# Patient Record
Sex: Female | Born: 1945
Health system: Southern US, Community
[De-identification: ages and names within clinical notes are randomized; demographics above are authoritative.]

## PROBLEM LIST (undated history)

## (undated) DIAGNOSIS — R609 Edema, unspecified: Secondary | ICD-10-CM

## (undated) DIAGNOSIS — M1990 Unspecified osteoarthritis, unspecified site: Secondary | ICD-10-CM

## (undated) DIAGNOSIS — R600 Localized edema: Secondary | ICD-10-CM

## (undated) DIAGNOSIS — G5600 Carpal tunnel syndrome, unspecified upper limb: Secondary | ICD-10-CM

## (undated) DIAGNOSIS — T7840XA Allergy, unspecified, initial encounter: Secondary | ICD-10-CM

## (undated) DIAGNOSIS — F419 Anxiety disorder, unspecified: Secondary | ICD-10-CM

## (undated) DIAGNOSIS — K219 Gastro-esophageal reflux disease without esophagitis: Secondary | ICD-10-CM

## (undated) DIAGNOSIS — F32A Depression, unspecified: Secondary | ICD-10-CM

## (undated) DIAGNOSIS — M65312 Trigger thumb, left thumb: Secondary | ICD-10-CM

## (undated) DIAGNOSIS — T782XXA Anaphylactic shock, unspecified, initial encounter: Secondary | ICD-10-CM

## (undated) DIAGNOSIS — I10 Essential (primary) hypertension: Secondary | ICD-10-CM

## (undated) DIAGNOSIS — F329 Major depressive disorder, single episode, unspecified: Secondary | ICD-10-CM

## (undated) HISTORY — DX: Essential (primary) hypertension: I10

## (undated) HISTORY — PX: CARPAL TUNNEL RELEASE: SHX101

## (undated) HISTORY — DX: Anaphylactic shock, unspecified, initial encounter: T78.2XXA

## (undated) HISTORY — PX: ROTATOR CUFF REPAIR: SHX139

## (undated) HISTORY — DX: Carpal tunnel syndrome, unspecified upper limb: G56.00

## (undated) HISTORY — DX: Trigger thumb, left thumb: M65.312

## (undated) HISTORY — DX: Localized edema: R60.0

## (undated) HISTORY — DX: Edema, unspecified: R60.9

## (undated) HISTORY — DX: Allergy, unspecified, initial encounter: T78.40XA

---

## 2006-09-07 ENCOUNTER — Emergency Department: Payer: Self-pay

## 2009-01-15 ENCOUNTER — Encounter: Payer: Self-pay | Admitting: Gastroenterology

## 2009-01-22 ENCOUNTER — Encounter: Payer: Self-pay | Admitting: Gastroenterology

## 2009-01-22 ENCOUNTER — Ambulatory Visit: Payer: Self-pay | Admitting: General Surgery

## 2009-01-24 ENCOUNTER — Encounter: Payer: Self-pay | Admitting: Gastroenterology

## 2009-02-12 ENCOUNTER — Encounter: Payer: Self-pay | Admitting: Gastroenterology

## 2009-02-16 ENCOUNTER — Ambulatory Visit: Payer: Self-pay | Admitting: General Surgery

## 2009-02-16 ENCOUNTER — Encounter: Payer: Self-pay | Admitting: Gastroenterology

## 2009-03-03 ENCOUNTER — Encounter: Payer: Self-pay | Admitting: Gastroenterology

## 2009-03-03 DIAGNOSIS — R933 Abnormal findings on diagnostic imaging of other parts of digestive tract: Secondary | ICD-10-CM

## 2009-03-18 ENCOUNTER — Encounter: Payer: Self-pay | Admitting: Gastroenterology

## 2009-03-18 ENCOUNTER — Ambulatory Visit (HOSPITAL_COMMUNITY): Admission: RE | Admit: 2009-03-18 | Discharge: 2009-03-18 | Payer: Self-pay | Admitting: Gastroenterology

## 2009-03-18 ENCOUNTER — Ambulatory Visit: Payer: Self-pay | Admitting: Gastroenterology

## 2009-03-19 ENCOUNTER — Telehealth: Payer: Self-pay | Admitting: Gastroenterology

## 2009-03-23 ENCOUNTER — Encounter (INDEPENDENT_AMBULATORY_CARE_PROVIDER_SITE_OTHER): Payer: Self-pay | Admitting: *Deleted

## 2009-03-29 ENCOUNTER — Telehealth: Payer: Self-pay | Admitting: Gastroenterology

## 2009-04-21 HISTORY — PX: HERNIA REPAIR: SHX51

## 2009-05-03 ENCOUNTER — Ambulatory Visit: Payer: Self-pay | Admitting: General Surgery

## 2009-05-10 ENCOUNTER — Ambulatory Visit: Payer: Self-pay | Admitting: General Surgery

## 2012-01-08 LAB — HM PAP SMEAR

## 2013-06-06 ENCOUNTER — Ambulatory Visit: Payer: Self-pay | Admitting: Orthopedic Surgery

## 2013-07-10 ENCOUNTER — Ambulatory Visit: Payer: Self-pay | Admitting: Orthopedic Surgery

## 2013-07-10 LAB — URINALYSIS, COMPLETE
Bacteria: NONE SEEN
Bilirubin,UR: NEGATIVE
Glucose,UR: NEGATIVE mg/dL (ref 0–75)
Nitrite: NEGATIVE
Ph: 5 (ref 4.5–8.0)
Protein: NEGATIVE
Specific Gravity: 1.024 (ref 1.003–1.030)
WBC UR: 14 /HPF (ref 0–5)

## 2013-07-15 ENCOUNTER — Ambulatory Visit: Payer: Self-pay | Admitting: Orthopedic Surgery

## 2013-07-19 ENCOUNTER — Emergency Department: Payer: Self-pay | Admitting: Emergency Medicine

## 2013-07-19 LAB — URINALYSIS, COMPLETE
Bacteria: NONE SEEN
Bilirubin,UR: NEGATIVE
Glucose,UR: NEGATIVE mg/dL (ref 0–75)
Nitrite: NEGATIVE
Ph: 6 (ref 4.5–8.0)
Protein: 100
RBC,UR: 237 /HPF (ref 0–5)
WBC UR: 1678 /HPF (ref 0–5)

## 2013-07-21 LAB — URINE CULTURE

## 2013-12-03 DIAGNOSIS — M19019 Primary osteoarthritis, unspecified shoulder: Secondary | ICD-10-CM | POA: Insufficient documentation

## 2013-12-31 DIAGNOSIS — K219 Gastro-esophageal reflux disease without esophagitis: Secondary | ICD-10-CM | POA: Insufficient documentation

## 2014-07-21 DIAGNOSIS — M7540 Impingement syndrome of unspecified shoulder: Secondary | ICD-10-CM | POA: Insufficient documentation

## 2014-07-21 DIAGNOSIS — Z471 Aftercare following joint replacement surgery: Secondary | ICD-10-CM | POA: Insufficient documentation

## 2014-07-21 DIAGNOSIS — Z96612 Presence of left artificial shoulder joint: Secondary | ICD-10-CM

## 2014-12-11 NOTE — Op Note (Signed)
PATIENT NAME:  Emily Watson, ADORNO MR#:  [id number] DATE OF BIRTH:  12-17-1945  DATE OF PROCEDURE:  07/15/2013  PREOPERATIVE DIAGNOSES: Chronic left shoulder impingement, acromioclavicular joint arthritis, glenohumeral joint arthritis, partial rotator cuff tear and biceps tendinosis.   POSTOPERATIVE DIAGNOSES: Left shoulder chronic impingement, acromioclavicular and glenohumeral joint arthritis, partial rotator cuff tear, biceps tendinosis, glenohumeral synovitis, severe subacromial bursitis and superior labral tear.   PROCEDURE: Left shoulder arthroscopic chondroplasty of the humeral head, debridement of the superior labrum, partial synovectomy of the glenohumeral joint, subacromial decompression, distal clavicle excision, arthroscopic biceps tenotomy and mini-open rotator cuff repair.   ANESTHESIA: General with left interscalene block.   SURGEON: Thornton Park, M.D.   ASSISTANT: Leone Haven, an [employer] student.   ESTIMATED BLOOD LOSS: Minimal.   COMPLICATIONS: None.   IMPLANTS: ArthroCare Magnum 2 anchors x 2, ArthroCare Magnum M anchor x1.   INDICATIONS FOR PROCEDURE: The patient is a 69 year old female who injured her right shoulder at work. 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 The patient developed these symptoms while lifting a cage at work. This occurred in June of 2014. She was initially evaluated and treated at [facility]. She has failed nonoperative management including multiple corticosteroid injections, physical therapy, as well as nonsteroidal anti-inflammatories. The patient had an MRI demonstrating a severe glenohumeral joint arthrosis as well as demonstrated tenderness of the supraspinatus tendon with a small interstitial tear near its peripheral attachment. The patient also was found to have fluid in the subdeltoid bursa consistent with bursitis. The patient has opted to proceed with surgical intervention given her failure  of nonoperative management.   The risks and benefits of the surgery were discussed with the patient. She understood the risks of performing this operation included, but are not limited to, infection, bleeding, nerve or blood vessel injury, especially injury to the axillary nerve leading to arm numbness or weakness, shoulder stiffness, persistent pain, reinjury and the need for further surgery. Medical complications include, but are not limited to, DVT and pulmonary embolism, myocardial infarction, stroke, pneumonia, respiratory failure and death.    PROCEDURE NOTE: The patient was met in the preoperative area. Her daughter-in-law was at the bedside. I reviewed the postoperative course with the patient. I signed her left shoulder with the word yes according to the hospital's right site protocol, after verbally confirming with the patient that this was the correct site of surgery. I also confirmed the correct site of surgery with my office notes and the radiographic studies. I updated the patient's history and physical. The patient was then brought to the operating room. She underwent a left interscalene block by the anesthesia department. She underwent general endotracheal intubation. She was then placed in a beach chair position. A spider arm positioner was used for this case. All bony prominences were adequately padded. The patient was then prepped and draped in a sterile fashion.   A timeout was performed to verify the patient's name, date of birth, medical record number, correct site of surgery and correct procedure to be performed. It was also used to verify the patient had received antibiotics and that all appropriate instruments, implants and radiographic studies were available in the room. Once all in attendance were in agreement, the case began.   Examination under anesthesia demonstrated full range of motion without instability to load-and-shift testing in both the anterior and posterior directions.  She had a negative sulcus sign.   Bony landmarks were  drawn out with a surgical marker along with proposed incisions. The posterior incision was then made with an 11 blade. A hemostat was used to spread the posterior muscle fibers, and the arthroscope was placed into the glenohumeral joint from the posterior portal. An anterior portal was then established using an 18-gauge spinal needle for localization. The anterior portal was also created with 11 blade. A 5.75 mm cannula was placed through the anterior portal. A full diagnostic examination of the glenohumeral joint was performed.   Findings on arthroscopy included a superior labral tear, anterior and posterior (SLAP tear). The patient had no anterior labral tear, but there was fraying of the posterior labrum. The patient had an intact subscapularis. The biceps tendon was significantly thickened and degenerative. The patient had fraying of the supraspinatus at the greater tuberosity. There was extensive glenohumeral joint arthrosis with chondral loss. There was extensive glenohumeral arthritis on both the glenoid surface as well as the humeral head. There was no Hagl lesion nor loose bodies in the inferior recess.   The superior labral tear was debrided with a 4.0 resector shaver blade placed through the anterior portal. A curved resector shaver blade was then used to perform a chondroplasty of the humeral head removing any loose flaps of cartilage. Given the extensive degeneration within the biceps tendon, a biceps tenotomy was performed using an arthroscopic scissors to release the biceps tendon from its attachment to the superior labrum. The biceps stump was debrided with a 4.0 resector shaver blade from the superior labrum. An 0 PDS was then placed as a marker through the supraspinatus to allow for identification of the supraspinatus from the bursal side.   The arthroscope was then removed from the glenohumeral joint. It was then placed into the  subacromial space. Immediately, extensive bursitis was seen with the arthroscope, making visualization very difficult. A lateral portal was then established, again using an 18-gauge spinal needle for localization. A 4.0 resector shaver blade was placed through the lateral portal, and an extensive bursectomy was performed. A 90-degree ArthroCare wand was used to cauterize any bleeding vessels and to assist in the bursectomy. The ArthroCare wand was also used to identify the margins of the acromion and the [facility] joint. A 5.5 mm resector shaver blade was then placed through the lateral portal, and a subacromial decompression was performed. The 5.5 mm resector shaver blade was then placed through the anterior portal into the [facility] joint where a distal clavicle excision was performed. Final images of the distal clavicle excision, subacromial decompression and bursectomy were taken. Once the bursectomy was completed, the 0 PDS was visualized. The arthroscope was then placed through the lateral portal. A hook probe was placed through the posterior portal, and the supraspinatus was carefully probed. There was a defect in the posterior aspect of the supraspinatus from the bursal side. The decision was, therefore, made to take down the supraspinatus for repair. The 0 PDS was removed. A 4.0 resector shaver blade and 90-degree ArthroCare wand were then used to remove the supraspinatus from its greater tuberosity attachment. Once the supraspinatus was removed from the greater tuberosity, a single Smart stitch was placed through the lateral portal for later identification during the mini-open rotator cuff repair.   All arthroscopic instruments were removed. A saber-type incision was made along the lateral border of the acromion. The deltoid fascia was then identified. The deltoid was split in line with its fibers. The 2 suture tails from the Smart stitch were then brought out through  the deltoid split. A self-retaining shoulder  retractor was placed through the deltoid split allowing visualization of the lateral border of the supraspinatus which had been tagged with a Smart stitch. Two additional Smart stitches were placed in the lateral border of the rotator cuff. The initial tagging Smart stitch was removed. The greater tuberosity was then carefully burred to remove all remaining fibers of the rotator cuff and to allow visualization of punctate bleeding which would assist in the healing of the rotator cuff. A single drill hole was then placed at the articular margin of the humeral head allowing for placement of a single ArthroCare Magnum M anchor. The 4 suture tails from this suture device were then passed with a FirstPass suture passer through the medial portion of the rotator cuff and clamped for later repair. Then, 2 drill holes were made along the lateral proximal humerus, and 2 Magnum 2 anchors were placed in these holes after being preloaded with the Smart stitch sutures. The sutures were then tensioned to allow for excellent approximation of the lateral rotator cuff to the greater tuberosity. The Magnum 2 anchors were then deployed. The remaining suture tails were then cut using an arthroscopic suture cutter. The 2 Magnum M sutures were then hand tied down for the medial row fixation and again cut with an arthroscopic suture cutter. The patient had excellent approximation of the supraspinatus to the greater tuberosity footprint. There were no dog ears. The rotator cuff moved as a unit with rotation of the proximal humerus. Pictures of the repair were taken both externally and from the glenohumeral joint using the arthroscope. The wounds were then copiously irrigated. All arthroscopic equipment was removed. The deltoid fascia was closed with 0 Vicryl and the subcutaneous tissue of the saber incision was closed with a 2-0 Vicryl. The skin of the saber incision was closed with a running 4-0 Monocryl. The 3 portal incisions were closed  with 4-0 nylon in an interrupted fashion. Steri-Strips and Xeroform were placed over the incisions and 30 mL of 0.25% Marcaine plain injected in the subacromial space and the glenohumeral joint.   Dry sterile dressing was applied along with TENS unit leads and a Polar Care sleeve, as well as an abduction sling. The patient was then awakened and transferred to a hospital stretcher and brought to the PACU in stable condition. I was scrubbed and present for the entire case, and all sharp and instrument counts were correct at the conclusion of the case. I spoke with the patient's daughter-in-law in the postop consultation room to let her know the case had gone without complication and the patient was stable in the recovery room.   ____________________________ Timoteo Gaul, MD [id number] D: 07/16/2013 22:51:39 ET T: 07/16/2013 23:23:22 ET JOB#: [id number]  cc: Timoteo Gaul, MD, <Dictator> Timoteo Gaul MD ELECTRONICALLY SIGNED 07/30/2013 17:58

## 2015-03-14 ENCOUNTER — Other Ambulatory Visit: Payer: Self-pay | Admitting: Unknown Physician Specialty

## 2015-04-21 ENCOUNTER — Other Ambulatory Visit: Payer: Self-pay | Admitting: Unknown Physician Specialty

## 2015-04-21 NOTE — Telephone Encounter (Signed)
Needs seen further refills 

## 2015-05-18 ENCOUNTER — Other Ambulatory Visit: Payer: Self-pay | Admitting: Unknown Physician Specialty

## 2015-05-26 DIAGNOSIS — H04211 Epiphora due to excess lacrimation, right lacrimal gland: Secondary | ICD-10-CM | POA: Diagnosis not present

## 2015-06-03 ENCOUNTER — Ambulatory Visit: Payer: Self-pay

## 2015-06-04 ENCOUNTER — Ambulatory Visit: Payer: Medicare Other

## 2015-06-04 DIAGNOSIS — Z23 Encounter for immunization: Secondary | ICD-10-CM

## 2015-06-07 DIAGNOSIS — R609 Edema, unspecified: Secondary | ICD-10-CM | POA: Insufficient documentation

## 2015-06-07 DIAGNOSIS — R600 Localized edema: Secondary | ICD-10-CM | POA: Insufficient documentation

## 2015-06-07 DIAGNOSIS — M65312 Trigger thumb, left thumb: Secondary | ICD-10-CM

## 2015-06-07 DIAGNOSIS — J309 Allergic rhinitis, unspecified: Secondary | ICD-10-CM | POA: Insufficient documentation

## 2015-06-07 DIAGNOSIS — F411 Generalized anxiety disorder: Secondary | ICD-10-CM | POA: Insufficient documentation

## 2015-06-07 DIAGNOSIS — G5600 Carpal tunnel syndrome, unspecified upper limb: Secondary | ICD-10-CM | POA: Insufficient documentation

## 2015-06-07 DIAGNOSIS — IMO0002 Reserved for concepts with insufficient information to code with codable children: Secondary | ICD-10-CM

## 2015-06-07 DIAGNOSIS — E7800 Pure hypercholesterolemia, unspecified: Secondary | ICD-10-CM | POA: Insufficient documentation

## 2015-06-07 DIAGNOSIS — F419 Anxiety disorder, unspecified: Secondary | ICD-10-CM

## 2015-06-07 DIAGNOSIS — T782XXA Anaphylactic shock, unspecified, initial encounter: Secondary | ICD-10-CM | POA: Insufficient documentation

## 2015-06-07 DIAGNOSIS — H9319 Tinnitus, unspecified ear: Secondary | ICD-10-CM | POA: Insufficient documentation

## 2015-06-07 DIAGNOSIS — T782XXS Anaphylactic shock, unspecified, sequela: Secondary | ICD-10-CM

## 2015-06-07 DIAGNOSIS — I10 Essential (primary) hypertension: Secondary | ICD-10-CM | POA: Insufficient documentation

## 2015-06-08 ENCOUNTER — Ambulatory Visit: Payer: Self-pay | Admitting: Unknown Physician Specialty

## 2015-06-16 ENCOUNTER — Other Ambulatory Visit: Payer: Self-pay | Admitting: Unknown Physician Specialty

## 2015-07-14 ENCOUNTER — Encounter: Payer: Self-pay | Admitting: Unknown Physician Specialty

## 2015-07-16 ENCOUNTER — Other Ambulatory Visit: Payer: Self-pay | Admitting: Unknown Physician Specialty

## 2015-08-03 ENCOUNTER — Encounter: Payer: Self-pay | Admitting: Unknown Physician Specialty

## 2015-08-03 ENCOUNTER — Ambulatory Visit (INDEPENDENT_AMBULATORY_CARE_PROVIDER_SITE_OTHER): Payer: Medicare Other | Admitting: Unknown Physician Specialty

## 2015-08-03 VITALS — BP 140/80 | HR 92 | Temp 98.4°F | Ht 61.3 in | Wt 207.6 lb

## 2015-08-03 DIAGNOSIS — Z23 Encounter for immunization: Secondary | ICD-10-CM | POA: Diagnosis not present

## 2015-08-03 DIAGNOSIS — L509 Urticaria, unspecified: Secondary | ICD-10-CM

## 2015-08-03 DIAGNOSIS — L739 Follicular disorder, unspecified: Secondary | ICD-10-CM

## 2015-08-03 DIAGNOSIS — F419 Anxiety disorder, unspecified: Secondary | ICD-10-CM

## 2015-08-03 MED ORDER — TRIAMCINOLONE ACETONIDE 0.1 % EX CREA: 1.0000 "application " | TOPICAL_CREAM | Freq: Two times a day (BID) | CUTANEOUS | Status: DC

## 2015-08-03 MED ORDER — DOXYCYCLINE HYCLATE 100 MG PO TABS: 100.0000 mg | ORAL_TABLET | Freq: Two times a day (BID) | ORAL | Status: DC

## 2015-08-03 MED ORDER — DULOXETINE HCL 60 MG PO CPEP: 60.0000 mg | ORAL_CAPSULE | Freq: Every day | ORAL | Status: DC

## 2015-08-03 NOTE — Assessment & Plan Note (Signed)
This seems to be related to a family issue.  Encouraged exercise and pastoral counseling

## 2015-08-03 NOTE — Progress Notes (Signed)
BP 140/80 mmHg  Pulse 92  Temp(Src) 98.4 F (36.9 C)  Ht 5' 1.3" (1.557 m)  Wt 207 lb 9.6 oz (94.167 kg)  BMI 38.84 kg/m2  SpO2 94%  LMP 02/01/1991 (Approximate)   Subjective:    Patient ID: Emily Watson, female    DOB: 1945/08/30, 69 y.o.   MRN: [id number]  HPI: Emily Watson is a 69 y.o. female  Chief Complaint  Patient presents with  . Anxiety    pt states she gets very impatient and starts itching when she gets nervous  . Shoulder Pain    pt states she has been having some left shoulder pain   Depression Her depression is doing well but gets anxious easily.  It seems to be related to family changes that are going on for a while.    Depression screen [facility] 2/9 08/03/2015  Decreased Interest 0  Down, Depressed, Hopeless 0  PHQ - 2 Score 0   Rash Pt with breaking out in hives base of neck and has radiated to upper back.  She is very itchy.  This has been going on for several weeks.    Relevant past medical, surgical, family and social history reviewed and updated as indicated. Interim medical history since our last visit reviewed. Allergies and medications reviewed and updated.  Review of Systems  Constitutional: Negative.   HENT: Negative.   Eyes: Negative.   Respiratory: Negative.   Cardiovascular: Negative.   Gastrointestinal: Negative.   Endocrine: Negative.   Genitourinary: Negative.   Musculoskeletal:       Bruised left elbow with falling.  She is protective of left shoulder  Skin: Negative.   Allergic/Immunologic: Negative.   Neurological: Negative.   Hematological: Negative.   Psychiatric/Behavioral: Negative.     Per HPI unless specifically indicated above     Objective:    BP 140/80 mmHg  Pulse 92  Temp(Src) 98.4 F (36.9 C)  Ht 5' 1.3" (1.557 m)  Wt 207 lb 9.6 oz (94.167 kg)  BMI 38.84 kg/m2  SpO2 94%  LMP 02/01/1991 (Approximate)  Wt Readings from Last 3 Encounters:  08/03/15 207 lb 9.6 oz (94.167 kg)  08/19/14 205 lb (92.987 kg)     Physical Exam  Constitutional: She is oriented to person, place, and time. She appears well-developed and well-nourished. No distress.  HENT:  Head: Normocephalic and atraumatic.  Eyes: Conjunctivae and lids are normal. Right eye exhibits no discharge. Left eye exhibits no discharge. No scleral icterus.  Cardiovascular: Normal rate, regular rhythm and normal heart sounds.   Pulmonary/Chest: Effort normal and breath sounds normal.  Abdominal: Normal appearance. There is no splenomegaly or hepatomegaly.  Musculoskeletal: Normal range of motion.  Neurological: She is alert and oriented to person, place, and time.  Skin: Skin is warm, dry and intact. No rash noted. No pallor.  Macules back of neck and upper back.    Psychiatric: She has a normal mood and affect. Her behavior is normal. Judgment and thought content normal.    Results for orders placed or performed in visit on 06/07/15  HM PAP SMEAR  Result Value Ref Range   HM Pap smear from PP       Assessment & Plan:   Problem List Items Addressed This Visit      Unprioritized   Acute anxiety    This seems to be related to a family issue.  Encouraged exercise and pastoral counseling      Relevant Medications   DULoxetine (CYMBALTA) 60  MG capsule    Other Visit Diagnoses    Immunization due    -  Primary    Relevant Orders    Pneumococcal polysaccharide vaccine 23-valent greater than or equal to 2yo subcutaneous/IM (Completed)    Urticaria        Steroid cream    Folliculitis        doxycycline as I suspect infectious source       BP is much better on second check  Follow up plan: Return in about 6 weeks (around 09/14/2015).

## 2015-09-03 ENCOUNTER — Other Ambulatory Visit: Payer: Self-pay | Admitting: Unknown Physician Specialty

## 2015-09-14 ENCOUNTER — Encounter: Payer: Self-pay | Admitting: Unknown Physician Specialty

## 2015-09-14 ENCOUNTER — Ambulatory Visit (INDEPENDENT_AMBULATORY_CARE_PROVIDER_SITE_OTHER): Payer: Medicare Other | Admitting: Unknown Physician Specialty

## 2015-09-14 VITALS — BP 153/79 | HR 83 | Temp 98.3°F | Ht 60.3 in | Wt 207.2 lb

## 2015-09-14 DIAGNOSIS — H9319 Tinnitus, unspecified ear: Secondary | ICD-10-CM

## 2015-09-14 DIAGNOSIS — I10 Essential (primary) hypertension: Secondary | ICD-10-CM | POA: Diagnosis not present

## 2015-09-14 DIAGNOSIS — R1032 Left lower quadrant pain: Secondary | ICD-10-CM | POA: Diagnosis not present

## 2015-09-14 NOTE — Assessment & Plan Note (Signed)
discussed

## 2015-09-14 NOTE — Progress Notes (Signed)
BP 153/79 mmHg  Pulse 83  Temp(Src) 98.3 F (36.8 C)  Ht 5' 0.3" (1.532 m)  Wt 207 lb 3.2 oz (93.985 kg)  BMI 40.04 kg/m2  SpO2 94%  LMP 02/01/1991 (Approximate)   Subjective:    Patient ID: Emily Watson, female    DOB: 07/18/1946, 70 y.o.   MRN: [id number]  HPI: Emily Watson is a 70 y.o. female  Chief Complaint  Patient presents with  . Hypertension  . Hearing Problem    pt states she has been having trouble hearing here recently  . Pain    pt states she has been having some pain in groin area on left side and in her left knee   Hypertension: F/u BP from last visit.  She is not checking it at home.  No SOB, chest pain, or swelling.    Relevant past medical, surgical, family and social history reviewed and updated as indicated. Interim medical history since our last visit reviewed. Allergies and medications reviewed and updated.  Review of Systems  Constitutional: Negative.   HENT: Positive for tinnitus.        Tinnius left ear  Eyes: Negative.   Respiratory: Negative.   Cardiovascular: Negative.   Gastrointestinal: Negative.   Endocrine: Negative.   Musculoskeletal:       Left groin pain for 2 weeks and maybe longer.  Comes and goes.      Per HPI unless specifically indicated above     Objective:    BP 153/79 mmHg  Pulse 83  Temp(Src) 98.3 F (36.8 C)  Ht 5' 0.3" (1.532 m)  Wt 207 lb 3.2 oz (93.985 kg)  BMI 40.04 kg/m2  SpO2 94%  LMP 02/01/1991 (Approximate)  Wt Readings from Last 3 Encounters:  09/14/15 207 lb 3.2 oz (93.985 kg)  08/03/15 207 lb 9.6 oz (94.167 kg)  08/19/14 205 lb (92.987 kg)    Physical Exam  Constitutional: She is oriented to person, place, and time. She appears well-developed and well-nourished. No distress.  HENT:  Head: Normocephalic and atraumatic.  Eyes: Conjunctivae and lids are normal. Right eye exhibits no discharge. Left eye exhibits no discharge. No scleral icterus.  Neck: Normal range of motion. Neck supple. No  JVD present. Carotid bruit is not present.  Cardiovascular: Normal rate, regular rhythm and normal heart sounds.   Pulmonary/Chest: Effort normal and breath sounds normal.  Abdominal: Normal appearance. There is no splenomegaly or hepatomegaly.  Musculoskeletal: Normal range of motion.  Neurological: She is alert and oriented to person, place, and time.  Skin: Skin is warm, dry and intact. No rash noted. No pallor.  Psychiatric: She has a normal mood and affect. Her behavior is normal. Judgment and thought content normal.    Results for orders placed or performed in visit on 06/07/15  HM PAP SMEAR  Result Value Ref Range   HM Pap smear from PP       Assessment & Plan:   Problem List Items Addressed This Visit      Unprioritized   Hypertension - Primary    On no meds.  BP borderline high.  Pt ed on Dash diet and weight loss.        Tinnitus    discussed       Other Visit Diagnoses    Groin pain, left        Tylenol.  Monitor and consider x-ray        Follow up plan: Return in about 3 months (around 12/13/2015).

## 2015-09-14 NOTE — Patient Instructions (Signed)
DASH Eating Plan  DASH stands for "Dietary Approaches to Stop Hypertension." The DASH eating plan is a healthy eating plan that has been shown to reduce high blood pressure (hypertension). Additional health benefits may include reducing the risk of type 2 diabetes mellitus, heart disease, and stroke. The DASH eating plan may also help with weight loss.  WHAT DO I NEED TO KNOW ABOUT THE DASH EATING PLAN?  For the DASH eating plan, you will follow these general guidelines:  · Choose foods with a percent daily value for sodium of less than 5% (as listed on the food label).  · Use salt-free seasonings or herbs instead of table salt or sea salt.  · Check with your health care provider or pharmacist before using salt substitutes.  · Eat lower-sodium products, often labeled as "lower sodium" or "no salt added."  · Eat fresh foods.  · Eat more vegetables, fruits, and low-fat dairy products.  · Choose whole grains. Look for the word "whole" as the first word in the ingredient list.  · Choose fish and skinless chicken or turkey more often than red meat. Limit fish, poultry, and meat to 6 oz (170 g) each day.  · Limit sweets, desserts, sugars, and sugary drinks.  · Choose heart-healthy fats.  · Limit cheese to 1 oz (28 g) per day.  · Eat more home-cooked food and less restaurant, buffet, and fast food.  · Limit fried foods.  · Cook foods using methods other than frying.  · Limit canned vegetables. If you do use them, rinse them well to decrease the sodium.  · When eating at a restaurant, ask that your food be prepared with less salt, or no salt if possible.  WHAT FOODS CAN I EAT?  Seek help from a dietitian for individual calorie needs.  Grains  Whole grain or whole wheat bread. Brown rice. Whole grain or whole wheat pasta. Quinoa, bulgur, and whole grain cereals. Low-sodium cereals. Corn or whole wheat flour tortillas. Whole grain cornbread. Whole grain crackers. Low-sodium crackers.  Vegetables  Fresh or frozen vegetables  (raw, steamed, roasted, or grilled). Low-sodium or reduced-sodium tomato and vegetable juices. Low-sodium or reduced-sodium tomato sauce and paste. Low-sodium or reduced-sodium canned vegetables.   Fruits  All fresh, canned (in natural juice), or frozen fruits.  Meat and Other Protein Products  Ground beef (85% or leaner), grass-fed beef, or beef trimmed of fat. Skinless chicken or turkey. Ground chicken or turkey. Pork trimmed of fat. All fish and seafood. Eggs. Dried beans, peas, or lentils. Unsalted nuts and seeds. Unsalted canned beans.  Dairy  Low-fat dairy products, such as skim or 1% milk, 2% or reduced-fat cheeses, low-fat ricotta or cottage cheese, or plain low-fat yogurt. Low-sodium or reduced-sodium cheeses.  Fats and Oils  Tub margarines without trans fats. Light or reduced-fat mayonnaise and salad dressings (reduced sodium). Avocado. Safflower, olive, or canola oils. Natural peanut or almond butter.  Other  Unsalted popcorn and pretzels.  The items listed above may not be a complete list of recommended foods or beverages. Contact your dietitian for more options.  WHAT FOODS ARE NOT RECOMMENDED?  Grains  White bread. White pasta. White rice. Refined cornbread. Bagels and croissants. Crackers that contain trans fat.  Vegetables  Creamed or fried vegetables. Vegetables in a cheese sauce. Regular canned vegetables. Regular canned tomato sauce and paste. Regular tomato and vegetable juices.  Fruits  Dried fruits. Canned fruit in light or heavy syrup. Fruit juice.  Meat and Other Protein   Products  Fatty cuts of meat. Ribs, chicken wings, bacon, sausage, bologna, salami, chitterlings, fatback, hot dogs, bratwurst, and packaged luncheon meats. Salted nuts and seeds. Canned beans with salt.  Dairy  Whole or 2% milk, cream, half-and-half, and cream cheese. Whole-fat or sweetened yogurt. Full-fat cheeses or blue cheese. Nondairy creamers and whipped toppings. Processed cheese, cheese spreads, or cheese  curds.  Condiments  Onion and garlic salt, seasoned salt, table salt, and sea salt. Canned and packaged gravies. Worcestershire sauce. Tartar sauce. Barbecue sauce. Teriyaki sauce. Soy sauce, including reduced sodium. Steak sauce. Fish sauce. Oyster sauce. Cocktail sauce. Horseradish. Ketchup and mustard. Meat flavorings and tenderizers. Bouillon cubes. Hot sauce. Tabasco sauce. Marinades. Taco seasonings. Relishes.  Fats and Oils  Butter, stick margarine, lard, shortening, ghee, and bacon fat. Coconut, palm kernel, or palm oils. Regular salad dressings.  Other  Pickles and olives. Salted popcorn and pretzels.  The items listed above may not be a complete list of foods and beverages to avoid. Contact your dietitian for more information.  WHERE CAN I FIND MORE INFORMATION?  National Heart, Lung, and Blood Institute: www.nhlbi.nih.gov/health/health-topics/topics/dash/     This information is not intended to replace advice given to you by your health care provider. Make sure you discuss any questions you have with your health care provider.     Document Released: 07/27/2011 Document Revised: 08/28/2014 Document Reviewed: 06/11/2013  Elsevier Interactive Patient Education ©2016 [employer]

## 2015-09-14 NOTE — Assessment & Plan Note (Signed)
On no meds.  BP borderline high.  Pt ed on Dash diet and weight loss.

## 2015-10-01 ENCOUNTER — Other Ambulatory Visit: Payer: Self-pay | Admitting: Unknown Physician Specialty

## 2015-10-05 ENCOUNTER — Encounter: Payer: Self-pay | Admitting: Unknown Physician Specialty

## 2015-10-05 ENCOUNTER — Ambulatory Visit (INDEPENDENT_AMBULATORY_CARE_PROVIDER_SITE_OTHER): Payer: Medicare Other | Admitting: Unknown Physician Specialty

## 2015-10-05 VITALS — BP 122/73 | HR 86 | Temp 98.8°F | Ht 60.5 in | Wt 201.4 lb

## 2015-10-05 DIAGNOSIS — J069 Acute upper respiratory infection, unspecified: Secondary | ICD-10-CM

## 2015-10-05 NOTE — Progress Notes (Signed)
   BP 122/73 mmHg  Pulse 86  Temp(Src) 98.8 F (37.1 C)  Ht 5' 0.5" (1.537 m)  Wt 201 lb 6.4 oz (91.354 kg)  BMI 38.67 kg/m2  SpO2 93%  LMP 02/01/1991 (Approximate)   Subjective:    Patient ID: Emily Watson, female    DOB: [birth year], 70 y.o.   MRN: [id number]  HPI: Emily Watson is a 70 y.o. female  Chief Complaint  Patient presents with  . Cough    pt states she has a cough and some sore throat that started Saturday   URI  This is a new problem. The current episode started in the past 7 days (Started 3 days ago). The problem has been unchanged. There has been no fever. Associated symptoms include coughing, rhinorrhea and a sore throat. She has tried nothing for the symptoms.     Relevant past medical, surgical, family and social history reviewed and updated as indicated. Interim medical history since our last visit reviewed. Allergies and medications reviewed and updated.  Review of Systems  HENT: Positive for rhinorrhea and sore throat.   Respiratory: Positive for cough.     Per HPI unless specifically indicated above     Objective:    BP 122/73 mmHg  Pulse 86  Temp(Src) 98.8 F (37.1 C)  Ht 5' 0.5" (1.537 m)  Wt 201 lb 6.4 oz (91.354 kg)  BMI 38.67 kg/m2  SpO2 93%  LMP 02/01/1991 (Approximate)  Wt Readings from Last 3 Encounters:  10/05/15 201 lb 6.4 oz (91.354 kg)  09/14/15 207 lb 3.2 oz (93.985 kg)  08/03/15 207 lb 9.6 oz (94.167 kg)    Physical Exam  Constitutional: She is oriented to person, place, and time. She appears well-developed and well-nourished. No distress.  HENT:  Head: Normocephalic and atraumatic.  Right Ear: Tympanic membrane and ear canal normal.  Left Ear: Tympanic membrane and ear canal normal.  Nose: Rhinorrhea present. Right sinus exhibits no maxillary sinus tenderness and no frontal sinus tenderness. Left sinus exhibits no maxillary sinus tenderness and no frontal sinus tenderness.  Mouth/Throat: Mucous membranes are normal.  Posterior oropharyngeal erythema present.  Eyes: Conjunctivae and lids are normal. Right eye exhibits no discharge. Left eye exhibits no discharge. No scleral icterus.  Cardiovascular: Normal rate and regular rhythm.   Pulmonary/Chest: Effort normal and breath sounds normal. No respiratory distress.  Abdominal: Normal appearance. There is no splenomegaly or hepatomegaly.  Musculoskeletal: Normal range of motion.  Neurological: She is alert and oriented to person, place, and time.  Skin: Skin is intact. No rash noted. No pallor.  Psychiatric: She has a normal mood and affect. Her behavior is normal. Judgment and thought content normal.     Assessment & Plan:   Problem List Items Addressed This Visit    None    Visit Diagnoses    URI (upper respiratory infection)    -  Primary       Discussed supportive care.  Use OTC treatments for symptom relief.  Information given  Follow up plan: Return if symptoms worsen or fail to improve.

## 2015-10-05 NOTE — Patient Instructions (Signed)
Upper Respiratory Infection, Adult Most upper respiratory infections (URIs) are a viral infection of the air passages leading to the lungs. A URI affects the nose, throat, and upper air passages. The most common type of URI is nasopharyngitis and is typically referred to as "the common cold." URIs run their course and usually go away on their own. Most of the time, a URI does not require medical attention, but sometimes a bacterial infection in the upper airways can follow a viral infection. This is called a secondary infection. Sinus and middle ear infections are common types of secondary upper respiratory infections. Bacterial pneumonia can also complicate a URI. A URI can worsen asthma and chronic obstructive pulmonary disease (COPD). Sometimes, these complications can require emergency medical care and may be life threatening.  CAUSES Almost all URIs are caused by viruses. A virus is a type of germ and can spread from one person to another.  RISKS FACTORS You may be at risk for a URI if:   You smoke.   You have chronic heart or lung disease.  You have a weakened defense (immune) system.   You are very young or very old.   You have nasal allergies or asthma.  You work in crowded or poorly ventilated areas.  You work in health care facilities or schools. SIGNS AND SYMPTOMS  Symptoms typically develop 2-3 days after you come in contact with a cold virus. Most viral URIs last 7-10 days. However, viral URIs from the influenza virus (flu virus) can last 14-18 days and are typically more severe. Symptoms may include:   Runny or stuffy (congested) nose.   Sneezing.   Cough.   Sore throat.   Headache.   Fatigue.   Fever.   Loss of appetite.   Pain in your forehead, behind your eyes, and over your cheekbones (sinus pain).  Muscle aches.  DIAGNOSIS  Your health care provider may diagnose a URI by:  Physical exam.  Tests to check that your symptoms are not due to  another condition such as:  Strep throat.  Sinusitis.  Pneumonia.  Asthma. TREATMENT  A URI goes away on its own with time. It cannot be cured with medicines, but medicines may be prescribed or recommended to relieve symptoms. Medicines may help:  Reduce your fever.  Reduce your cough.  Relieve nasal congestion. HOME CARE INSTRUCTIONS   Take medicines only as directed by your health care provider.   Gargle warm saltwater or take cough drops to comfort your throat as directed by your health care provider.  Use a warm mist humidifier or inhale steam from a shower to increase air moisture. This may make it easier to breathe.  Drink enough fluid to keep your urine clear or pale yellow.   Eat soups and other clear broths and maintain good nutrition.   Rest as needed.   Return to work when your temperature has returned to normal or as your health care provider advises. You may need to stay home longer to avoid infecting others. You can also use a face mask and careful hand washing to prevent spread of the virus.  Increase the usage of your inhaler if you have asthma.   Do not use any tobacco products, including cigarettes, chewing tobacco, or electronic cigarettes. If you need help quitting, ask your health care provider. PREVENTION  The best way to protect yourself from getting a cold is to practice good hygiene.   Avoid oral or hand contact with people with cold   symptoms.   Wash your hands often if contact occurs.  There is no clear evidence that vitamin C, vitamin E, echinacea, or exercise reduces the chance of developing a cold. However, it is always recommended to get plenty of rest, exercise, and practice good nutrition.  SEEK MEDICAL CARE IF:   You are getting worse rather than better.   Your symptoms are not controlled by medicine.   You have chills.  You have worsening shortness of breath.  You have brown or red mucus.  You have yellow or brown nasal  discharge.  You have pain in your face, especially when you bend forward.  You have a fever.  You have swollen neck glands.  You have pain while swallowing.  You have white areas in the back of your throat. SEEK IMMEDIATE MEDICAL CARE IF:   You have severe or persistent:  Headache.  Ear pain.  Sinus pain.  Chest pain.  You have chronic lung disease and any of the following:  Wheezing.  Prolonged cough.  Coughing up blood.  A change in your usual mucus.  You have a stiff neck.  You have changes in your:  Vision.  Hearing.  Thinking.  Mood. MAKE SURE YOU:   Understand these instructions.  Will watch your condition.  Will get help right away if you are not doing well or get worse.   This information is not intended to replace advice given to you by your health care provider. Make sure you discuss any questions you have with your health care provider.   Document Released: 01/31/2001 Document Revised: 12/22/2014 Document Reviewed: 11/12/2013 Elsevier Interactive Patient Education 2016 Elsevier Inc.  

## 2015-10-16 DIAGNOSIS — S40862A Insect bite (nonvenomous) of left upper arm, initial encounter: Secondary | ICD-10-CM | POA: Diagnosis not present

## 2015-10-16 DIAGNOSIS — L03114 Cellulitis of left upper limb: Secondary | ICD-10-CM | POA: Diagnosis not present

## 2015-10-16 DIAGNOSIS — Z87891 Personal history of nicotine dependence: Secondary | ICD-10-CM | POA: Diagnosis not present

## 2015-10-16 DIAGNOSIS — T63484A Toxic effect of venom of other arthropod, undetermined, initial encounter: Secondary | ICD-10-CM | POA: Diagnosis not present

## 2015-10-27 ENCOUNTER — Other Ambulatory Visit: Payer: Self-pay | Admitting: Unknown Physician Specialty

## 2015-11-24 ENCOUNTER — Other Ambulatory Visit: Payer: Self-pay | Admitting: Unknown Physician Specialty

## 2015-12-15 ENCOUNTER — Ambulatory Visit: Payer: Medicare Other | Admitting: Unknown Physician Specialty

## 2015-12-25 ENCOUNTER — Other Ambulatory Visit: Payer: Self-pay | Admitting: Unknown Physician Specialty

## 2016-01-10 ENCOUNTER — Ambulatory Visit (INDEPENDENT_AMBULATORY_CARE_PROVIDER_SITE_OTHER): Payer: Medicare Other | Admitting: Unknown Physician Specialty

## 2016-01-10 ENCOUNTER — Encounter: Payer: Self-pay | Admitting: Unknown Physician Specialty

## 2016-01-10 VITALS — BP 139/78 | HR 93 | Temp 98.4°F | Ht 60.7 in | Wt 195.4 lb

## 2016-01-10 DIAGNOSIS — F419 Anxiety disorder, unspecified: Secondary | ICD-10-CM

## 2016-01-10 DIAGNOSIS — L509 Urticaria, unspecified: Secondary | ICD-10-CM | POA: Diagnosis not present

## 2016-01-10 DIAGNOSIS — L739 Follicular disorder, unspecified: Secondary | ICD-10-CM | POA: Diagnosis not present

## 2016-01-10 MED ORDER — DOXYCYCLINE HYCLATE 100 MG PO TABS: 100.0000 mg | ORAL_TABLET | Freq: Two times a day (BID) | ORAL | Status: DC

## 2016-01-10 MED ORDER — PREDNISONE 20 MG PO TABS: 40.0000 mg | ORAL_TABLET | Freq: Every day | ORAL | Status: DC

## 2016-01-10 NOTE — Assessment & Plan Note (Signed)
Seems to be related to family situation

## 2016-01-10 NOTE — Progress Notes (Signed)
BP 139/78 mmHg  Pulse 93  Temp(Src) 98.4 F (36.9 C)  Ht 5' 0.7" (1.542 m)  Wt 195 lb 6.4 oz (88.633 kg)  BMI 37.28 kg/m2  SpO2 94%  LMP 02/01/1991 (Approximate)   Subjective:    Patient ID: Emily Watson, female    DOB: February 09, 1946, 70 y.o.   MRN: [id number]  HPI: Emily Realheresa Hottel is a 70 y.o. female  Chief Complaint  Patient presents with  . Anxiety  . bumps    pt states she was seen at [facility] ago and still has bumps on her head that itch   Here for f/u blood pressure.  Good numbers today and getting 120's/70's at home.  No chest pain or SOB  Urticaria States she has bumps on her head for 2 weeks.  Had them before and I gave her antibiotics which she said helped.  States she doesn't want to go anywhere "with thinks on my head" and scratching.  [facility] for an area of cellulitis on the left arm and given antibiotics.  She states the areas on her head went away after this but came back  [facility] 01/10/2016 08/03/2015  Decreased Interest 0 0  Down, Depressed, Hopeless 0 0  PHQ - 2 Score 0 0    Relevant past medical, surgical, family and social history reviewed and updated as indicated. Interim medical history since our last visit reviewed. Allergies and medications reviewed and updated.  Review of Systems  Per HPI unless specifically indicated above     Objective:    BP 139/78 mmHg  Pulse 93  Temp(Src) 98.4 F (36.9 C)  Ht 5' 0.7" (1.542 m)  Wt 195 lb 6.4 oz (88.633 kg)  BMI 37.28 kg/m2  SpO2 94%  LMP 02/01/1991 (Approximate)  Wt Readings from Last 3 Encounters:  01/10/16 195 lb 6.4 oz (88.633 kg)  10/05/15 201 lb 6.4 oz (91.354 kg)  09/14/15 207 lb 3.2 oz (93.985 kg)    Physical Exam  Constitutional: She is oriented to person, place, and time. She appears well-developed and well-nourished. No distress.  HENT:  Head: Normocephalic and atraumatic.  Eyes: Conjunctivae and lids are normal. Right eye exhibits no  discharge. Left eye exhibits no discharge. No scleral icterus.  Cardiovascular: Normal rate.   Pulmonary/Chest: Effort normal.  Abdominal: Normal appearance. There is no splenomegaly or hepatomegaly.  Musculoskeletal: Normal range of motion.  Neurological: She is alert and oriented to person, place, and time.  Skin: Skin is intact. No rash noted. No pallor.  Pt with erythemetous and scabbed areas on scalp not on a dermatomal pattern.    Psychiatric: She has a normal mood and affect. Her behavior is normal. Judgment and thought content normal.     Results for orders placed or performed in visit on 06/07/15  HM PAP SMEAR  Result Value Ref Range   HM Pap smear from PP       Assessment & Plan:   Problem List Items Addressed This Visit      Unprioritized   Acute anxiety    Seems to be related to family situation       Other Visit Diagnoses    Folliculitis    -  Primary    Urticaria           Will rx another round of Doxycycline as that seemed to help.  Refer to Dermatology to help ID the cause.  Will also rx  Prednisone.    Follow up plan: Return if symptoms worsen or fail to improve.

## 2016-02-12 ENCOUNTER — Other Ambulatory Visit: Payer: Self-pay | Admitting: Unknown Physician Specialty

## 2016-02-12 DIAGNOSIS — Z1231 Encounter for screening mammogram for malignant neoplasm of breast: Secondary | ICD-10-CM | POA: Diagnosis not present

## 2016-05-12 ENCOUNTER — Other Ambulatory Visit: Payer: Self-pay

## 2016-05-12 MED ORDER — DULOXETINE HCL 60 MG PO CPEP: 60.0000 mg | ORAL_CAPSULE | Freq: Every day | ORAL | 3 refills | Status: DC

## 2016-06-08 ENCOUNTER — Other Ambulatory Visit (INDEPENDENT_AMBULATORY_CARE_PROVIDER_SITE_OTHER): Payer: Medicare Other

## 2016-06-08 DIAGNOSIS — Z23 Encounter for immunization: Secondary | ICD-10-CM

## 2016-08-18 ENCOUNTER — Ambulatory Visit (INDEPENDENT_AMBULATORY_CARE_PROVIDER_SITE_OTHER): Payer: Medicare Other | Admitting: Unknown Physician Specialty

## 2016-08-18 ENCOUNTER — Encounter: Payer: Self-pay | Admitting: Unknown Physician Specialty

## 2016-08-18 ENCOUNTER — Other Ambulatory Visit: Payer: Self-pay

## 2016-08-18 VITALS — BP 116/77 | HR 87 | Temp 98.1°F | Wt 189.0 lb

## 2016-08-18 DIAGNOSIS — I10 Essential (primary) hypertension: Secondary | ICD-10-CM | POA: Diagnosis not present

## 2016-08-18 DIAGNOSIS — M25569 Pain in unspecified knee: Secondary | ICD-10-CM | POA: Insufficient documentation

## 2016-08-18 DIAGNOSIS — M25562 Pain in left knee: Secondary | ICD-10-CM

## 2016-08-18 DIAGNOSIS — L989 Disorder of the skin and subcutaneous tissue, unspecified: Secondary | ICD-10-CM | POA: Diagnosis not present

## 2016-08-18 DIAGNOSIS — F324 Major depressive disorder, single episode, in partial remission: Secondary | ICD-10-CM

## 2016-08-18 DIAGNOSIS — Z1159 Encounter for screening for other viral diseases: Secondary | ICD-10-CM

## 2016-08-18 DIAGNOSIS — F321 Major depressive disorder, single episode, moderate: Secondary | ICD-10-CM | POA: Insufficient documentation

## 2016-08-18 MED ORDER — MELOXICAM 15 MG PO TABS: 15.0000 mg | ORAL_TABLET | Freq: Every day | ORAL | 0 refills | Status: DC

## 2016-08-18 MED ORDER — DOXYCYCLINE HYCLATE 100 MG PO TABS: 100.0000 mg | ORAL_TABLET | Freq: Two times a day (BID) | ORAL | 0 refills | Status: DC

## 2016-08-18 NOTE — Assessment & Plan Note (Signed)
Rx for Mobic.  Will RTC for steroid injections

## 2016-08-18 NOTE — Progress Notes (Addendum)
BP 116/77   Pulse 87   Temp 98.1 F (36.7 C)   Wt 189 lb (85.7 kg)   LMP 02/01/1991 (Approximate)   SpO2 99%   BMI 36.07 kg/m    Subjective:    Patient ID: Emily Watson, female    DOB: September 14, 1945, 70 y.o.   MRN: [id number]  HPI: Emily Realheresa Kandler is a 70 y.o. female  Chief Complaint  Patient presents with  . Follow-up    still having the bumps on her scalp  . Knee Pain    left knee x couple months, feels like there is twisting.   . Lab    she is willing to have hep c screening   Scalp lesions States her scalp lesions discussed with me in May are recurrent.  Antibiotics seem to help and I charted I referred to dermatology but I don't see the referral.  She again got better in May when I prescribed Doxycycline.  States they hurt and burn.    Depression/anxiety/agitation Takes Cymbalta for "nerves."  Her son finds her impatient and no help with Cymbalta.  She does have a lot of stress and suffered several losses. No memory issues.  Things upset her easily.  She is sleeping well.  [facility] 08/18/2016 01/10/2016 08/03/2015  Decreased Interest 0 0 0  Down, Depressed, Hopeless 0 0 0  PHQ - 2 Score 0 0 0  Altered sleeping 1 - -  Tired, decreased energy 1 - -  Change in appetite 0 - -  Feeling bad or failure about yourself  0 - -  Trouble concentrating 0 - -  Moving slowly or fidgety/restless 0 - -  Suicidal thoughts 0 - -  PHQ-9 Score 2 - -   Knee pain States she is having left knee pain.  She twisted it about 2 months ago.  She notices it swelling, trouble climbing the bleachers.  Hurts more after getting up from a sitting position.  She is concerned that she will fall when she walks.    Relevant past medical, surgical, family and social history reviewed and updated as indicated. Interim medical history since our last visit reviewed. Allergies and medications reviewed and updated.  Review of Systems  Per HPI unless specifically indicated above       Objective:    BP 116/77   Pulse 87   Temp 98.1 F (36.7 C)   Wt 189 lb (85.7 kg)   LMP 02/01/1991 (Approximate)   SpO2 99%   BMI 36.07 kg/m   Wt Readings from Last 3 Encounters:  08/18/16 189 lb (85.7 kg)  01/10/16 195 lb 6.4 oz (88.6 kg)  10/05/15 201 lb 6.4 oz (91.4 kg)    Physical Exam  Constitutional: She is oriented to person, place, and time. She appears well-developed and well-nourished. No distress.  HENT:  Head: Normocephalic and atraumatic.  Eyes: Conjunctivae and lids are normal. Right eye exhibits no discharge. Left eye exhibits no discharge. No scleral icterus.  Neck: Normal range of motion. Neck supple. No JVD present. Carotid bruit is not present.  Cardiovascular: Normal rate, regular rhythm and normal heart sounds.   Pulmonary/Chest: Effort normal and breath sounds normal.  Abdominal: Normal appearance. There is no splenomegaly or hepatomegaly.  Musculoskeletal: Normal range of motion.       Left knee: She exhibits swelling. She exhibits normal range of motion, no effusion, no erythema, normal alignment, no LCL laxity, no bony tenderness, normal meniscus and no MCL laxity. No tenderness  found.  Neurological: She is alert and oriented to person, place, and time.  Skin: Skin is warm and dry. Rash noted. No pallor.  Diffuse scalp lesons that are scabbed  Psychiatric: She has a normal mood and affect. Her behavior is normal. Judgment and thought content normal.    Results for orders placed or performed in visit on 06/07/15  HM PAP SMEAR  Result Value Ref Range   HM Pap smear from PP       Assessment & Plan:   Problem List Items Addressed This Visit      Unprioritized   Depression    Her depression is under good control.  She still seems to have a lot of agitation but unable to consider counseling due to finances.  Discussed church and volunteer activities.        Hypertension    Recheck is 116/70      Knee pain    Rx for Mobic.  Will RTC for steroid  injections      Relevant Medications   meloxicam (MOBIC) 15 MG tablet   Skin lesions    On scalp.  Rx for Doxycycline which helped previously and will refer to dermatology      Relevant Orders   Ambulatory referral to Dermatology    Other Visit Diagnoses    Need for hepatitis C screening test    -  Primary   Relevant Orders   Hepatitis C Antibody       Follow up plan: Return for knee injection. and discuss agitation.

## 2016-08-18 NOTE — Assessment & Plan Note (Signed)
On scalp.  Rx for Doxycycline which helped previously and will refer to dermatology

## 2016-08-18 NOTE — Addendum Note (Signed)
Addended by: Gabriel CirriWICKER, Cabrini Ruggieri on: 08/18/2016 11:46 AM   Modules accepted: Orders

## 2016-08-18 NOTE — Assessment & Plan Note (Signed)
Her depression is under good control.  She still seems to have a lot of agitation but unable to consider counseling due to finances.  Discussed church and volunteer activities.

## 2016-08-18 NOTE — Assessment & Plan Note (Signed)
Recheck is 116/70

## 2016-08-22 ENCOUNTER — Telehealth: Payer: Self-pay | Admitting: Unknown Physician Specialty

## 2016-08-22 NOTE — Telephone Encounter (Signed)
Referral was just put in on Friday 08/18/2016 and [facility] was closed   Because they were closed,  I faxed over the referral this morning. Fax confirmation received. Will call to be sure they received the referral.  Referral wasn't marked urgent. Normal 7-10 day turn around time on routine referrals.

## 2016-08-22 NOTE — Telephone Encounter (Signed)
Routing to referral coordinator.

## 2016-08-22 NOTE — Telephone Encounter (Signed)
[facility] has received the referral.  Patient notified.  They should be contacting patient this week.

## 2016-08-22 NOTE — Telephone Encounter (Signed)
Patient called to check  [facility]   Thank Bonita QuinYou Clydie BraunKaren  Ph number to [facility]  [phone]

## 2016-08-30 ENCOUNTER — Ambulatory Visit (INDEPENDENT_AMBULATORY_CARE_PROVIDER_SITE_OTHER): Payer: Medicare Other | Admitting: Unknown Physician Specialty

## 2016-08-30 ENCOUNTER — Encounter: Payer: Self-pay | Admitting: Unknown Physician Specialty

## 2016-08-30 DIAGNOSIS — M25562 Pain in left knee: Secondary | ICD-10-CM

## 2016-08-30 NOTE — Assessment & Plan Note (Signed)
Better after the injection.  Will RTC if further problems

## 2016-08-30 NOTE — Progress Notes (Signed)
   BP (!) 144/80 (BP Location: Left Arm, Patient Position: Sitting, Cuff Size: Large)   Pulse 71   Temp 98.2 F (36.8 C)   Wt 188 lb 3.2 oz (85.4 kg)   LMP 02/01/1991 (Approximate)   SpO2 96%   BMI 35.91 kg/m    Subjective:    Patient ID: Emily Watson, female    DOB: Dec 27, 1945, 71 y.o.   MRN: [id number]  HPI: Emily Realheresa Pocock is a 71 y.o. female  Chief Complaint  Patient presents with  . Knee Pain    pt states she is here for a left knee injection  . Cyst    pt states she has a little spot she would like looked at behind her right ear that she noticed a week ago    Knee pain Pt with left sided knee pain for many years.  Frequently catches.    Relevant past medical, surgical, family and social history reviewed and updated as indicated. Interim medical history since our last visit reviewed. Allergies and medications reviewed and updated.  Review of Systems  Per HPI unless specifically indicated above     Objective:    BP (!) 144/80 (BP Location: Left Arm, Patient Position: Sitting, Cuff Size: Large)   Pulse 71   Temp 98.2 F (36.8 C)   Wt 188 lb 3.2 oz (85.4 kg)   LMP 02/01/1991 (Approximate)   SpO2 96%   BMI 35.91 kg/m   Wt Readings from Last 3 Encounters:  08/30/16 188 lb 3.2 oz (85.4 kg)  08/18/16 189 lb (85.7 kg)  01/10/16 195 lb 6.4 oz (88.6 kg)    Physical Exam  Constitutional: She is oriented to person, place, and time. She appears well-developed and well-nourished. No distress.  HENT:  Head: Normocephalic and atraumatic.  Eyes: Conjunctivae and lids are normal. Right eye exhibits no discharge. Left eye exhibits no discharge. No scleral icterus.  Cardiovascular: Normal rate.   Pulmonary/Chest: Effort normal.  Abdominal: Normal appearance. There is no splenomegaly or hepatomegaly.  Musculoskeletal: Normal range of motion.  Neurological: She is alert and oriented to person, place, and time.  Skin: Skin is intact. No rash noted. No pallor.  Psychiatric:  She has a normal mood and affect. Her behavior is normal. Judgment and thought content normal.   STEROID INJECTION  Procedure: Knee Intraarticular Steroid Injection   Description: After verbal consent and patient ed on Area prepped and draped using  semi-sterile technique. Using a anterior  approach, a mixture of 9 cc of  1% Marcaine & 1 cc of Kenalog 40 was injected into knee joint.  A bandage was then placed over the injection site. Complications:  none Post Procedure Instructions: To the ER if any symptoms of erythema or swelling.   Follow Up: PRN  Results for orders placed or performed in visit on 06/07/15  HM PAP SMEAR  Result Value Ref Range   HM Pap smear from PP       Assessment & Plan:   Problem List Items Addressed This Visit      Unprioritized   Knee pain    Better after the injection.  Will RTC if further problems          Follow up plan: No Follow-up on file.

## 2016-09-01 DIAGNOSIS — L280 Lichen simplex chronicus: Secondary | ICD-10-CM | POA: Diagnosis not present

## 2016-09-01 DIAGNOSIS — L738 Other specified follicular disorders: Secondary | ICD-10-CM | POA: Diagnosis not present

## 2016-09-11 ENCOUNTER — Telehealth: Payer: Self-pay | Admitting: Unknown Physician Specialty

## 2016-09-11 DIAGNOSIS — Z5181 Encounter for therapeutic drug level monitoring: Secondary | ICD-10-CM

## 2016-09-11 DIAGNOSIS — M25562 Pain in left knee: Secondary | ICD-10-CM

## 2016-09-11 MED ORDER — MELOXICAM 15 MG PO TABS: 15.0000 mg | ORAL_TABLET | Freq: Every day | ORAL | 1 refills | Status: DC

## 2016-09-11 NOTE — Telephone Encounter (Signed)
OK, but she needs a CMP to check kidneys.  I will enter it

## 2016-09-11 NOTE — Telephone Encounter (Signed)
Routing to provider  

## 2016-09-12 NOTE — Telephone Encounter (Signed)
Called and cancelled meloxicam RX with Therapist, [facility].

## 2016-09-12 NOTE — Telephone Encounter (Signed)
Called and left patient a VM asking for her to please return my call.  

## 2016-09-12 NOTE — Telephone Encounter (Signed)
Patient returned my call. She stated that she was not taking this medication because she could not afford it. Patient asked for me to call and cancel this prescription with the pharmacy. Will call them now.

## 2016-10-16 DIAGNOSIS — L738 Other specified follicular disorders: Secondary | ICD-10-CM | POA: Diagnosis not present

## 2016-11-30 ENCOUNTER — Telehealth: Payer: Self-pay

## 2016-11-30 NOTE — Telephone Encounter (Signed)
Attempted to reach to schedule for Medicare Wellness. Message left for pt to return call.   

## 2017-01-04 ENCOUNTER — Ambulatory Visit (INDEPENDENT_AMBULATORY_CARE_PROVIDER_SITE_OTHER): Payer: Medicare Other | Admitting: Family Medicine

## 2017-01-04 ENCOUNTER — Encounter: Payer: Self-pay | Admitting: Family Medicine

## 2017-01-04 ENCOUNTER — Telehealth: Payer: Self-pay | Admitting: Family Medicine

## 2017-01-04 VITALS — BP 117/72 | HR 79 | Temp 98.3°F | Wt 195.4 lb

## 2017-01-04 DIAGNOSIS — M25562 Pain in left knee: Secondary | ICD-10-CM

## 2017-01-04 DIAGNOSIS — G8929 Other chronic pain: Secondary | ICD-10-CM

## 2017-01-04 DIAGNOSIS — S20212A Contusion of left front wall of thorax, initial encounter: Secondary | ICD-10-CM | POA: Diagnosis not present

## 2017-01-04 MED ORDER — TRIAMCINOLONE ACETONIDE 40 MG/ML IJ SUSP
40.0000 mg | Freq: Once | INTRAMUSCULAR | Status: AC
  Administered 2017-01-04: 40 mg via INTRAMUSCULAR

## 2017-01-04 NOTE — Telephone Encounter (Signed)
[facility] is in patient's chart as preferred pharmacy and sent to patient's pharmacy. E-Receipt confirmed 05/07/2016 90 capsules w/ 3 refills.    Routing to provider for FYI.

## 2017-01-04 NOTE — Telephone Encounter (Signed)
Patient stopped by to leave a message for provider that the pharmacy she uses for her medication is called [facility]. Patient states this pharmacy helps her afford her medications. Patient also wanted to show provider a copy of the prescription that she gets from the pharmacy which is DULOXETINE HCL [facility]. This Clinical research associatewriter made a copy of the prescription and gave to the provider for patient.   Please Advise.  Thank you.

## 2017-01-04 NOTE — Progress Notes (Signed)
BP 117/72 (BP Location: Left Arm, Patient Position: Sitting, Cuff Size: Large)   Pulse 79   Temp 98.3 F (36.8 C)   Wt 195 lb 6.4 oz (88.6 kg)   LMP 02/01/1991 (Approximate)   SpO2 95%   BMI 37.29 kg/m    Subjective:    Patient ID: Emily Watson, female    DOB: 05-11-1946, 71 y.o.   MRN: [id number]  HPI: Emily Realheresa Geralds is a 71 y.o. female  Chief Complaint  Patient presents with  . Knee Pain    Left. Gets cortisone shots.   . Shoulder Pain    About a month ago, got hit by door.   Patient presents with left knee pain x about 6 months. Was seen for this in January and knee injection was given at this time. Minimal relief from this or PO medications. States initial injury was a twist injury and now joint locks up during stairs or other movements. Has had knee issues in the past but was pain free for years prior to this. Denies significant swelling, erythema, tenderness, or stiffness.   Also having some left chest tenderness under her breast from running into a door several weeks ago. Denies bruising, SOB, sharp pains. Improving over time. Not taking anything for her discomfort. Mainly just wants reassurance.   Relevant past medical, surgical, family and social history reviewed and updated as indicated. Interim medical history since our last visit reviewed. Allergies and medications reviewed and updated.  Review of Systems  Constitutional: Negative.   HENT: Negative.   Respiratory: Negative.   Cardiovascular: Negative.   Gastrointestinal: Negative.   Genitourinary: Negative.   Musculoskeletal: Positive for arthralgias (right knee) and myalgias (left chest).  Neurological: Negative.   Psychiatric/Behavioral: Negative.    Per HPI unless specifically indicated above     Objective:    BP 117/72 (BP Location: Left Arm, Patient Position: Sitting, Cuff Size: Large)   Pulse 79   Temp 98.3 F (36.8 C)   Wt 195 lb 6.4 oz (88.6 kg)   LMP 02/01/1991 (Approximate)   SpO2 95%    BMI 37.29 kg/m   Wt Readings from Last 3 Encounters:  01/04/17 195 lb 6.4 oz (88.6 kg)  08/30/16 188 lb 3.2 oz (85.4 kg)  08/18/16 189 lb (85.7 kg)    Physical Exam  Constitutional: She is oriented to person, place, and time. She appears well-developed and well-nourished. No distress.  HENT:  Head: Atraumatic.  Eyes: Conjunctivae are normal. Pupils are equal, round, and reactive to light.  Neck: Normal range of motion. Neck supple.  Cardiovascular: Normal rate and normal heart sounds.   Pulmonary/Chest: Effort normal and breath sounds normal. No respiratory distress.  Chest rise symmetric, breath sounds WNL  Musculoskeletal: Normal range of motion.  Left knee - Negative McMurray's, anterior and posterior drawer, some discomfort with varus and valgus but not significant. ROM intact with mild crepitus.  Left chest underneath breast mildly ttp at site of impact. No rib abnormalities on palptation or severe point tenderness.   Neurological: She is alert and oriented to person, place, and time.  Skin: Skin is warm and dry. No rash noted. No erythema.  Psychiatric: She has a normal mood and affect. Her behavior is normal.  Nursing note and vitals reviewed.     Assessment & Plan:   Problem List Items Addressed This Visit      Other   Knee pain - Primary    Likely arthritic, but given mechanism cannot r/o meniscus injury.  Ortho referral placed and knee x-ray ordered. Continue OTC pain relievers       Relevant Medications   triamcinolone acetonide (KENALOG-40) injection 40 mg (Completed)   Other Relevant Orders   AMB referral to orthopedics   DG Knee Complete 4 Views Left (Completed)    Other Visit Diagnoses    Contusion of left chest wall, initial encounter       Reassurance given that the soreness will slowly fade. Continue to avoid strenuous activity or direct impact to area, ice, biofreeze, OTC pain relievers.        Follow up plan: Return for as scheduled.

## 2017-01-08 ENCOUNTER — Telehealth: Payer: Self-pay | Admitting: Family Medicine

## 2017-01-08 ENCOUNTER — Ambulatory Visit
Admission: RE | Admit: 2017-01-08 | Discharge: 2017-01-08 | Disposition: A | Discharge status: Home / Self Care | Payer: Medicare Other | Source: Ambulatory Visit | Attending: Family Medicine | Admitting: Family Medicine

## 2017-01-08 DIAGNOSIS — G8929 Other chronic pain: Secondary | ICD-10-CM | POA: Insufficient documentation

## 2017-01-08 DIAGNOSIS — M25562 Pain in left knee: Secondary | ICD-10-CM | POA: Diagnosis not present

## 2017-01-08 DIAGNOSIS — I739 Peripheral vascular disease, unspecified: Secondary | ICD-10-CM | POA: Diagnosis not present

## 2017-01-08 DIAGNOSIS — M1712 Unilateral primary osteoarthritis, left knee: Secondary | ICD-10-CM | POA: Diagnosis not present

## 2017-01-08 NOTE — Telephone Encounter (Signed)
Patient notified of results.

## 2017-01-08 NOTE — Telephone Encounter (Signed)
Please call and let her know her knee x-ray showed no acute bony abnormality, just degenerative changes of arthritis as expected

## 2017-01-09 NOTE — Assessment & Plan Note (Signed)
Likely arthritic, but given mechanism cannot r/o meniscus injury. Ortho referral placed and knee x-ray ordered. Continue OTC pain relievers

## 2017-02-09 DIAGNOSIS — M1712 Unilateral primary osteoarthritis, left knee: Secondary | ICD-10-CM | POA: Diagnosis not present

## 2017-02-09 DIAGNOSIS — M25562 Pain in left knee: Secondary | ICD-10-CM | POA: Diagnosis not present

## 2017-02-20 ENCOUNTER — Other Ambulatory Visit: Payer: Self-pay | Admitting: Orthopedic Surgery

## 2017-02-20 DIAGNOSIS — M1712 Unilateral primary osteoarthritis, left knee: Secondary | ICD-10-CM

## 2017-03-06 ENCOUNTER — Telehealth: Payer: Self-pay | Admitting: Unknown Physician Specialty

## 2017-03-06 ENCOUNTER — Ambulatory Visit
Admission: RE | Admit: 2017-03-06 | Discharge: 2017-03-06 | Disposition: A | Discharge status: Home / Self Care | Payer: Medicare Other | Source: Ambulatory Visit | Attending: Orthopedic Surgery | Admitting: Orthopedic Surgery

## 2017-03-06 DIAGNOSIS — M1712 Unilateral primary osteoarthritis, left knee: Secondary | ICD-10-CM | POA: Diagnosis not present

## 2017-03-06 NOTE — Telephone Encounter (Signed)
Routing to provider. Is it OK to fill out form?

## 2017-03-06 NOTE — Telephone Encounter (Signed)
Yes, it is OK

## 2017-03-06 NOTE — Telephone Encounter (Signed)
Patient would like  A handicap sticker for her car. Informed patient we will call her once form at office is filled out if approved for handicap sign.   Form placed in provider's mailbox.  Please Advise.  Thank you

## 2017-03-06 NOTE — Telephone Encounter (Signed)
Form filled out and placed up front for pick up. Left a note on the form and asked for the patient to please finish filling out the form and sign it, then let us please make a copy of it. Patient verbalized understanding and stated she would be by to pick it up soon.

## 2017-03-07 NOTE — Telephone Encounter (Signed)
Patient stopped by and filled out paperwork. Copy of paperwork was made, labels were put on and paperwork put in scan bin.

## 2017-03-14 ENCOUNTER — Encounter
Admission: RE | Admit: 2017-03-14 | Discharge: 2017-03-14 | Disposition: A | Discharge status: Home / Self Care | Payer: Medicare Other | Source: Ambulatory Visit | Attending: Orthopedic Surgery | Admitting: Orthopedic Surgery

## 2017-03-14 DIAGNOSIS — Z01818 Encounter for other preprocedural examination: Secondary | ICD-10-CM | POA: Diagnosis not present

## 2017-03-14 DIAGNOSIS — Z7982 Long term (current) use of aspirin: Secondary | ICD-10-CM | POA: Insufficient documentation

## 2017-03-14 DIAGNOSIS — Z79899 Other long term (current) drug therapy: Secondary | ICD-10-CM | POA: Diagnosis not present

## 2017-03-14 DIAGNOSIS — I10 Essential (primary) hypertension: Secondary | ICD-10-CM | POA: Diagnosis not present

## 2017-03-14 DIAGNOSIS — M1712 Unilateral primary osteoarthritis, left knee: Secondary | ICD-10-CM | POA: Diagnosis not present

## 2017-03-14 DIAGNOSIS — E7800 Pure hypercholesterolemia, unspecified: Secondary | ICD-10-CM | POA: Diagnosis not present

## 2017-03-14 HISTORY — DX: Gastro-esophageal reflux disease without esophagitis: K21.9

## 2017-03-14 HISTORY — DX: Depression, unspecified: F32.A

## 2017-03-14 HISTORY — DX: Major depressive disorder, single episode, unspecified: F32.9

## 2017-03-14 HISTORY — DX: Unspecified osteoarthritis, unspecified site: M19.90

## 2017-03-14 HISTORY — DX: Anxiety disorder, unspecified: F41.9

## 2017-03-14 LAB — PROTIME-INR
INR: 0.89
Prothrombin Time: 12 seconds (ref 11.4–15.2)

## 2017-03-14 LAB — URINALYSIS, COMPLETE (UACMP) WITH MICROSCOPIC
BACTERIA UA: NONE SEEN
BILIRUBIN URINE: NEGATIVE
GLUCOSE, UA: NEGATIVE mg/dL
HGB URINE DIPSTICK: NEGATIVE
Ketones, ur: NEGATIVE mg/dL
NITRITE: NEGATIVE
PH: 6 (ref 5.0–8.0)
Protein, ur: NEGATIVE mg/dL
Specific Gravity, Urine: 1.019 (ref 1.005–1.030)

## 2017-03-14 LAB — CBC
HEMATOCRIT: 39.3 % (ref 35.0–47.0)
Hemoglobin: 13.4 g/dL (ref 12.0–16.0)
MCH: 33.1 pg (ref 26.0–34.0)
MCHC: 34.1 g/dL (ref 32.0–36.0)
MCV: 97.1 fL (ref 80.0–100.0)
Platelets: 401 10*3/uL (ref 150–440)
RBC: 4.05 MIL/uL (ref 3.80–5.20)
RDW: 14.4 % (ref 11.5–14.5)
WBC: 7.1 10*3/uL (ref 3.6–11.0)

## 2017-03-14 LAB — BASIC METABOLIC PANEL
Anion gap: 8 (ref 5–15)
BUN: 18 mg/dL (ref 6–20)
CHLORIDE: 105 mmol/L (ref 101–111)
CO2: 26 mmol/L (ref 22–32)
CREATININE: 0.59 mg/dL (ref 0.44–1.00)
Calcium: 9.1 mg/dL (ref 8.9–10.3)
GFR calc non Af Amer: >60 mL/min (ref >60)
Glucose, Bld: 101 mg/dL — ABNORMAL HIGH (ref 65–99)
Potassium: 4.2 mmol/L (ref 3.5–5.1)
Sodium: 139 mmol/L (ref 135–145)

## 2017-03-14 LAB — SURGICAL PCR SCREEN
MRSA, PCR: NEGATIVE
Staphylococcus aureus: NEGATIVE

## 2017-03-14 LAB — APTT: APTT: 26 s (ref 24–36)

## 2017-03-14 LAB — SEDIMENTATION RATE: Sed Rate: 17 mm/hr (ref 0–30)

## 2017-03-14 NOTE — Patient Instructions (Signed)
Your procedure is scheduled on: 03/27/17 Tues Report to Same Day Surgery 2nd floor medical mall [facility](Medical Mall Entrance-take elevator on left to 2nd floor.  Check in with surgery information desk.) To find out your arrival time please call [phone] between 1PM - 3PM on 03/26/17 Mon  Remember: Instructions that are not followed completely may result in serious medical risk, up to and including death, or upon the discretion of your surgeon and anesthesiologist your surgery may need to be rescheduled.    _x___ 1. Do not eat food or drink liquids after midnight. No gum chewing or                              hard candies.     __x__ 2. No Alcohol for 24 hours before or after surgery.   __x__3. No Smoking for 24 prior to surgery.   ____  4. Bring all medications with you on the day of surgery if instructed.    __x__ 5. Notify your doctor if there is any change in your medical condition     (cold, fever, infections).     Do not wear jewelry, make-up, hairpins, clips or nail polish.  Do not wear lotions, powders, or perfumes. You may wear deodorant.  Do not shave 48 hours prior to surgery. Men may shave face and neck.  Do not bring valuables to the hospital.    [facility] is not responsible for any belongings or valuables.               Contacts, dentures or bridgework may not be worn into surgery.  Leave your suitcase in the car. After surgery it may be brought to your room.  For patients admitted to the hospital, discharge time is determined by your                       treatment team.   Patients discharged the day of surgery will not be allowed to drive home.  You will need someone to drive you home and stay with you the night of your procedure.    Please read over the following fact sheets that you were given:   [facility] Preparing for Surgery and or MRSA Information   _x___ Take anti-hypertensive (unless it includes a diuretic), cardiac, seizure, asthma,     anti-reflux and  psychiatric medicines. These include:  1. DULoxetine (CYMBALTA  2.omeprazole (PRILOSEC OTC) the night before and the morning of surgery  3.  4.  5.  6.  ____Fleets enema or Magnesium Citrate as directed.   _x___ Use CHG Soap or sage wipes as directed on instruction sheet   ____ Use inhalers on the day of surgery and bring to hospital day of surgery  ____ Stop Metformin and Janumet 2 days prior to surgery.    ____ Take 1/2 of usual insulin dose the night before surgery and none on the morning     surgery.   _x___ Follow recommendations from Cardiologist, Pulmonologist or PCP regarding          stopping Aspirin, Coumadin, Pllavix ,Eliquis, Effient, or Pradaxa, and Pletal.  X____Stop Anti-inflammatories such as Advil, Aleve, Ibuprofen, Motrin, Naproxen, Naprosyn, Goodies powders or aspirin products. OK to take Tylenol and                          Celebrex.   _x___ Stop supplements  until after surgery.  But may continue Vitamin D, Vitamin B,       and multivitamin.   ____ Bring C-Pap to the hospital.

## 2017-03-15 LAB — URINE CULTURE

## 2017-03-15 LAB — TYPE AND SCREEN
ABO/RH(D): B POS
Antibody Screen: NEGATIVE

## 2017-03-19 NOTE — Pre-Procedure Instructions (Signed)
Spoke with Consuella LoseElaine at Dr, Rosita KeaMenz office regarding urine culture results and suggest recollection.  She will check with Dr. Rosita KeaMenz and let us know if additional testing is needed.

## 2017-03-26 MED ORDER — CEFAZOLIN SODIUM-DEXTROSE 2-4 GM/100ML-% IV SOLN
2.0000 g | Freq: Once | INTRAVENOUS | Status: AC
  Administered 2017-03-27: 2 g via INTRAVENOUS

## 2017-03-27 ENCOUNTER — Encounter: Payer: Self-pay | Admitting: *Deleted

## 2017-03-27 ENCOUNTER — Inpatient Hospital Stay: Payer: Medicare Other | Admitting: Registered Nurse

## 2017-03-27 ENCOUNTER — Inpatient Hospital Stay
Admission: RE | Admit: 2017-03-27 | Discharge: 2017-03-30 | DRG: 470 | Disposition: A | Discharge status: Skilled Nursing Facility | Payer: Medicare Other | Source: Ambulatory Visit | Attending: Orthopedic Surgery | Admitting: Orthopedic Surgery

## 2017-03-27 ENCOUNTER — Encounter
Admission: RE | Disposition: A | Discharge status: Skilled Nursing Facility | Payer: Self-pay | Source: Ambulatory Visit | Attending: Orthopedic Surgery

## 2017-03-27 ENCOUNTER — Inpatient Hospital Stay: Payer: Medicare Other

## 2017-03-27 DIAGNOSIS — R262 Difficulty in walking, not elsewhere classified: Secondary | ICD-10-CM | POA: Diagnosis not present

## 2017-03-27 DIAGNOSIS — Z79899 Other long term (current) drug therapy: Secondary | ICD-10-CM | POA: Diagnosis not present

## 2017-03-27 DIAGNOSIS — Z808 Family history of malignant neoplasm of other organs or systems: Secondary | ICD-10-CM | POA: Diagnosis not present

## 2017-03-27 DIAGNOSIS — Z888 Allergy status to other drugs, medicaments and biological substances status: Secondary | ICD-10-CM | POA: Diagnosis not present

## 2017-03-27 DIAGNOSIS — Z8051 Family history of malignant neoplasm of kidney: Secondary | ICD-10-CM

## 2017-03-27 DIAGNOSIS — Z820 Family history of epilepsy and other diseases of the nervous system: Secondary | ICD-10-CM | POA: Diagnosis not present

## 2017-03-27 DIAGNOSIS — F39 Unspecified mood [affective] disorder: Secondary | ICD-10-CM | POA: Diagnosis not present

## 2017-03-27 DIAGNOSIS — Z803 Family history of malignant neoplasm of breast: Secondary | ICD-10-CM | POA: Diagnosis not present

## 2017-03-27 DIAGNOSIS — Z7401 Bed confinement status: Secondary | ICD-10-CM | POA: Diagnosis not present

## 2017-03-27 DIAGNOSIS — Z9049 Acquired absence of other specified parts of digestive tract: Secondary | ICD-10-CM | POA: Diagnosis not present

## 2017-03-27 DIAGNOSIS — K219 Gastro-esophageal reflux disease without esophagitis: Secondary | ICD-10-CM | POA: Diagnosis present

## 2017-03-27 DIAGNOSIS — M1712 Unilateral primary osteoarthritis, left knee: Principal | ICD-10-CM | POA: Diagnosis present

## 2017-03-27 DIAGNOSIS — Z8249 Family history of ischemic heart disease and other diseases of the circulatory system: Secondary | ICD-10-CM

## 2017-03-27 DIAGNOSIS — Z880 Allergy status to penicillin: Secondary | ICD-10-CM | POA: Diagnosis not present

## 2017-03-27 DIAGNOSIS — I10 Essential (primary) hypertension: Secondary | ICD-10-CM | POA: Diagnosis present

## 2017-03-27 DIAGNOSIS — Z7982 Long term (current) use of aspirin: Secondary | ICD-10-CM

## 2017-03-27 DIAGNOSIS — G8918 Other acute postprocedural pain: Secondary | ICD-10-CM

## 2017-03-27 DIAGNOSIS — M1990 Unspecified osteoarthritis, unspecified site: Secondary | ICD-10-CM | POA: Diagnosis present

## 2017-03-27 DIAGNOSIS — R6889 Other general symptoms and signs: Secondary | ICD-10-CM | POA: Diagnosis not present

## 2017-03-27 DIAGNOSIS — Z741 Need for assistance with personal care: Secondary | ICD-10-CM | POA: Diagnosis not present

## 2017-03-27 DIAGNOSIS — Z96612 Presence of left artificial shoulder joint: Secondary | ICD-10-CM | POA: Diagnosis present

## 2017-03-27 DIAGNOSIS — M6281 Muscle weakness (generalized): Secondary | ICD-10-CM | POA: Diagnosis not present

## 2017-03-27 DIAGNOSIS — Z471 Aftercare following joint replacement surgery: Secondary | ICD-10-CM | POA: Diagnosis not present

## 2017-03-27 DIAGNOSIS — Z96652 Presence of left artificial knee joint: Secondary | ICD-10-CM

## 2017-03-27 HISTORY — PX: TOTAL KNEE ARTHROPLASTY: SHX125

## 2017-03-27 LAB — CBC
HCT: 34.2 % — ABNORMAL LOW (ref 35.0–47.0)
Hemoglobin: 11.9 g/dL — ABNORMAL LOW (ref 12.0–16.0)
MCH: 34 pg (ref 26.0–34.0)
MCHC: 34.8 g/dL (ref 32.0–36.0)
MCV: 97.8 fL (ref 80.0–100.0)
PLATELETS: 323 10*3/uL (ref 150–440)
RBC: 3.49 MIL/uL — ABNORMAL LOW (ref 3.80–5.20)
RDW: 14.4 % (ref 11.5–14.5)
WBC: 16 10*3/uL — ABNORMAL HIGH (ref 3.6–11.0)

## 2017-03-27 LAB — CREATININE, SERUM
CREATININE: 0.6 mg/dL (ref 0.44–1.00)
GFR calc Af Amer: >60 mL/min (ref >60)
GFR calc non Af Amer: >60 mL/min (ref >60)

## 2017-03-27 LAB — ABO/RH: ABO/RH(D): B POS

## 2017-03-27 SURGERY — ARTHROPLASTY, KNEE, TOTAL
Anesthesia: Spinal | Site: Knee | Laterality: Left | Wound class: Clean

## 2017-03-27 MED ORDER — SODIUM CHLORIDE 0.9 % IJ SOLN
INTRAMUSCULAR | Status: AC
  Filled 2017-03-27: qty 100

## 2017-03-27 MED ORDER — METHOCARBAMOL 500 MG PO TABS: 500.0000 mg | ORAL_TABLET | Freq: Four times a day (QID) | ORAL | Status: DC | PRN

## 2017-03-27 MED ORDER — MENTHOL 3 MG MT LOZG
1.0000 | LOZENGE | OROMUCOSAL | Status: DC | PRN
  Filled 2017-03-27: qty 9

## 2017-03-27 MED ORDER — PHENYLEPHRINE HCL 10 MG/ML IJ SOLN
INTRAMUSCULAR | Status: DC | PRN
  Administered 2017-03-27: 100 ug via INTRAVENOUS
  Administered 2017-03-27: 50 ug via INTRAVENOUS
  Administered 2017-03-27: 100 ug via INTRAVENOUS

## 2017-03-27 MED ORDER — PROPOFOL 500 MG/50ML IV EMUL
INTRAVENOUS | Status: DC | PRN
  Administered 2017-03-27: 100 ug/kg/min via INTRAVENOUS

## 2017-03-27 MED ORDER — ADULT MULTIVITAMIN W/MINERALS CH
1.0000 | ORAL_TABLET | Freq: Every day | ORAL | Status: DC
  Administered 2017-03-28 – 2017-03-30 (×3): 1 via ORAL
  Filled 2017-03-27 (×3): qty 1

## 2017-03-27 MED ORDER — ACETAMINOPHEN 650 MG RE SUPP: 650.0000 mg | Freq: Four times a day (QID) | RECTAL | Status: DC | PRN

## 2017-03-27 MED ORDER — NEOMYCIN-POLYMYXIN B GU 40-200000 IR SOLN
Status: DC | PRN
  Administered 2017-03-27: 16 mL

## 2017-03-27 MED ORDER — ROPIVACAINE HCL 5 MG/ML IJ SOLN
INTRAMUSCULAR | Status: AC
  Filled 2017-03-27: qty 30

## 2017-03-27 MED ORDER — ONDANSETRON HCL 4 MG PO TABS
4.0000 mg | ORAL_TABLET | Freq: Four times a day (QID) | ORAL | Status: DC | PRN
Start: 2017-03-27 — End: 2017-03-30

## 2017-03-27 MED ORDER — PROPOFOL 10 MG/ML IV BOLUS
INTRAVENOUS | Status: AC
  Filled 2017-03-27: qty 20

## 2017-03-27 MED ORDER — MIDAZOLAM HCL 2 MG/2ML IJ SOLN
INTRAMUSCULAR | Status: AC
  Filled 2017-03-27: qty 2

## 2017-03-27 MED ORDER — ASPIRIN EC 81 MG PO TBEC
81.0000 mg | DELAYED_RELEASE_TABLET | Freq: Every day | ORAL | Status: DC
  Administered 2017-03-28 – 2017-03-30 (×3): 81 mg via ORAL
  Filled 2017-03-27 (×3): qty 1

## 2017-03-27 MED ORDER — ONDANSETRON HCL 4 MG/2ML IJ SOLN: 4.0000 mg | Freq: Four times a day (QID) | INTRAMUSCULAR | Status: DC | PRN

## 2017-03-27 MED ORDER — ACETAMINOPHEN 325 MG PO TABS
650.0000 mg | ORAL_TABLET | Freq: Four times a day (QID) | ORAL | Status: DC | PRN
  Administered 2017-03-27: 650 mg via ORAL
  Filled 2017-03-27: qty 2

## 2017-03-27 MED ORDER — TRANEXAMIC ACID 1000 MG/10ML IV SOLN
INTRAVENOUS | Status: AC
  Filled 2017-03-27: qty 10

## 2017-03-27 MED ORDER — SODIUM CHLORIDE 0.9 % IV SOLN
INTRAVENOUS | Status: DC | PRN
  Administered 2017-03-27: 60 mL

## 2017-03-27 MED ORDER — PHENOL 1.4 % MT LIQD
1.0000 | OROMUCOSAL | Status: DC | PRN
  Filled 2017-03-27: qty 177

## 2017-03-27 MED ORDER — FENTANYL CITRATE (PF) 100 MCG/2ML IJ SOLN
INTRAMUSCULAR | Status: DC | PRN
  Administered 2017-03-27 (×2): 25 ug via INTRAVENOUS

## 2017-03-27 MED ORDER — ENOXAPARIN SODIUM 30 MG/0.3ML ~~LOC~~ SOLN
30.0000 mg | Freq: Two times a day (BID) | SUBCUTANEOUS | Status: DC
  Administered 2017-03-28 – 2017-03-30 (×5): 30 mg via SUBCUTANEOUS
  Filled 2017-03-27 (×6): qty 0.3

## 2017-03-27 MED ORDER — ACETAMINOPHEN 10 MG/ML IV SOLN
INTRAVENOUS | Status: DC | PRN
  Administered 2017-03-27: 1000 mg via INTRAVENOUS

## 2017-03-27 MED ORDER — LIDOCAINE HCL 2 % EX GEL
CUTANEOUS | Status: AC
  Filled 2017-03-27: qty 5

## 2017-03-27 MED ORDER — METOCLOPRAMIDE HCL 5 MG/ML IJ SOLN: 5.0000 mg | Freq: Three times a day (TID) | INTRAMUSCULAR | Status: DC | PRN

## 2017-03-27 MED ORDER — OMEPRAZOLE MAGNESIUM 20 MG PO TBEC: 20.0000 mg | DELAYED_RELEASE_TABLET | Freq: Every day | ORAL | Status: DC

## 2017-03-27 MED ORDER — SODIUM CHLORIDE 0.9 % IV SOLN
INTRAVENOUS | Status: DC | PRN
  Administered 2017-03-27: 20 ug/min via INTRAVENOUS

## 2017-03-27 MED ORDER — ACETAMINOPHEN 10 MG/ML IV SOLN
INTRAVENOUS | Status: AC
  Filled 2017-03-27: qty 100

## 2017-03-27 MED ORDER — CEFAZOLIN SODIUM-DEXTROSE 2-4 GM/100ML-% IV SOLN
INTRAVENOUS | Status: AC
  Filled 2017-03-27: qty 100

## 2017-03-27 MED ORDER — OXYCODONE HCL 5 MG PO TABS
5.0000 mg | ORAL_TABLET | ORAL | Status: DC | PRN
  Administered 2017-03-27: 5 mg via ORAL
  Administered 2017-03-27 – 2017-03-30 (×10): 10 mg via ORAL
  Filled 2017-03-27 (×6): qty 2
  Filled 2017-03-27: qty 1
  Filled 2017-03-27 (×4): qty 2

## 2017-03-27 MED ORDER — MORPHINE SULFATE (PF) 2 MG/ML IV SOLN
2.0000 mg | INTRAVENOUS | Status: DC | PRN
  Administered 2017-03-27 – 2017-03-28 (×2): 2 mg via INTRAVENOUS
  Filled 2017-03-27 (×2): qty 1

## 2017-03-27 MED ORDER — BUPIVACAINE-EPINEPHRINE (PF) 0.25% -1:200000 IJ SOLN
INTRAMUSCULAR | Status: DC | PRN
  Administered 2017-03-27: 30 mL

## 2017-03-27 MED ORDER — CEFAZOLIN SODIUM-DEXTROSE 2-4 GM/100ML-% IV SOLN
2.0000 g | Freq: Four times a day (QID) | INTRAVENOUS | Status: AC
  Administered 2017-03-27 (×2): 2 g via INTRAVENOUS
  Filled 2017-03-27 (×2): qty 100

## 2017-03-27 MED ORDER — METOCLOPRAMIDE HCL 10 MG PO TABS: 5.0000 mg | ORAL_TABLET | Freq: Three times a day (TID) | ORAL | Status: DC | PRN

## 2017-03-27 MED ORDER — FENTANYL CITRATE (PF) 100 MCG/2ML IJ SOLN
INTRAMUSCULAR | Status: AC
  Filled 2017-03-27: qty 2

## 2017-03-27 MED ORDER — SODIUM CHLORIDE 0.9 % IJ SOLN
INTRAMUSCULAR | Status: DC | PRN
  Administered 2017-03-27: 28 mL

## 2017-03-27 MED ORDER — DOCUSATE SODIUM 100 MG PO CAPS
100.0000 mg | ORAL_CAPSULE | Freq: Two times a day (BID) | ORAL | Status: DC
  Administered 2017-03-27 – 2017-03-30 (×6): 100 mg via ORAL
  Filled 2017-03-27 (×6): qty 1

## 2017-03-27 MED ORDER — ONDANSETRON HCL 4 MG/2ML IJ SOLN: 4.0000 mg | Freq: Once | INTRAMUSCULAR | Status: DC | PRN

## 2017-03-27 MED ORDER — DIPHENHYDRAMINE HCL 12.5 MG/5ML PO ELIX: 12.5000 mg | ORAL_SOLUTION | ORAL | Status: DC | PRN

## 2017-03-27 MED ORDER — NEOMYCIN-POLYMYXIN B GU 40-200000 IR SOLN
Status: AC
  Filled 2017-03-27: qty 20

## 2017-03-27 MED ORDER — MAGNESIUM CITRATE PO SOLN
1.0000 | Freq: Once | ORAL | Status: DC | PRN
  Filled 2017-03-27: qty 296

## 2017-03-27 MED ORDER — BUPIVACAINE HCL (PF) 0.5 % IJ SOLN
INTRAMUSCULAR | Status: DC | PRN
  Administered 2017-03-27: 3 mL

## 2017-03-27 MED ORDER — LIDOCAINE HCL (PF) 2 % IJ SOLN
INTRAMUSCULAR | Status: AC
  Filled 2017-03-27: qty 2

## 2017-03-27 MED ORDER — KETOROLAC TROMETHAMINE 30 MG/ML IJ SOLN
INTRAMUSCULAR | Status: DC | PRN
Start: 2017-03-27 — End: 2017-03-27
  Administered 2017-03-27: 30 mg

## 2017-03-27 MED ORDER — MORPHINE SULFATE 10 MG/ML IJ SOLN
INTRAMUSCULAR | Status: DC | PRN
  Administered 2017-03-27: 10 mg

## 2017-03-27 MED ORDER — FENTANYL CITRATE (PF) 100 MCG/2ML IJ SOLN: 25.0000 ug | INTRAMUSCULAR | Status: DC | PRN

## 2017-03-27 MED ORDER — LACTATED RINGERS IV SOLN
INTRAVENOUS | Status: DC
  Administered 2017-03-27: 09:00:00 via INTRAVENOUS

## 2017-03-27 MED ORDER — MORPHINE SULFATE (PF) 10 MG/ML IV SOLN
INTRAVENOUS | Status: AC
  Filled 2017-03-27: qty 1

## 2017-03-27 MED ORDER — ZOLPIDEM TARTRATE 5 MG PO TABS: 5.0000 mg | ORAL_TABLET | Freq: Every evening | ORAL | Status: DC | PRN

## 2017-03-27 MED ORDER — PANTOPRAZOLE SODIUM 40 MG PO TBEC
40.0000 mg | DELAYED_RELEASE_TABLET | Freq: Every day | ORAL | Status: DC
  Administered 2017-03-28 – 2017-03-30 (×3): 40 mg via ORAL
  Filled 2017-03-27 (×3): qty 1

## 2017-03-27 MED ORDER — LIDOCAINE HCL (CARDIAC) 20 MG/ML IV SOLN
INTRAVENOUS | Status: DC | PRN
  Administered 2017-03-27: 40 mg via INTRAVENOUS

## 2017-03-27 MED ORDER — BUPIVACAINE HCL (PF) 0.5 % IJ SOLN
INTRAMUSCULAR | Status: AC
  Filled 2017-03-27: qty 10

## 2017-03-27 MED ORDER — BUPIVACAINE LIPOSOME 1.3 % IJ SUSP
INTRAMUSCULAR | Status: AC
  Filled 2017-03-27: qty 20

## 2017-03-27 MED ORDER — ONDANSETRON HCL 4 MG/2ML IJ SOLN
INTRAMUSCULAR | Status: AC
  Filled 2017-03-27: qty 2

## 2017-03-27 MED ORDER — SODIUM CHLORIDE 0.9 % IV SOLN
INTRAVENOUS | Status: DC
  Administered 2017-03-27 – 2017-03-28 (×2): via INTRAVENOUS

## 2017-03-27 MED ORDER — TRANEXAMIC ACID 1000 MG/10ML IV SOLN
INTRAVENOUS | Status: AC | PRN
  Administered 2017-03-27: 1000 mg via INTRAVENOUS

## 2017-03-27 MED ORDER — BISACODYL 10 MG RE SUPP
10.0000 mg | Freq: Every day | RECTAL | Status: DC | PRN
  Administered 2017-03-30: 10 mg via RECTAL
  Filled 2017-03-27: qty 1

## 2017-03-27 MED ORDER — MAGNESIUM HYDROXIDE 400 MG/5ML PO SUSP
30.0000 mL | Freq: Every day | ORAL | Status: DC | PRN
  Administered 2017-03-29: 30 mL via ORAL
  Filled 2017-03-27: qty 30

## 2017-03-27 MED ORDER — MIDAZOLAM HCL 5 MG/5ML IJ SOLN
INTRAMUSCULAR | Status: DC | PRN
  Administered 2017-03-27: 2 mg via INTRAVENOUS

## 2017-03-27 MED ORDER — DULOXETINE HCL 60 MG PO CPEP
60.0000 mg | ORAL_CAPSULE | Freq: Every day | ORAL | Status: DC
  Administered 2017-03-28 – 2017-03-30 (×3): 60 mg via ORAL
  Filled 2017-03-27 (×3): qty 1

## 2017-03-27 MED ORDER — METHOCARBAMOL 1000 MG/10ML IJ SOLN
500.0000 mg | Freq: Four times a day (QID) | INTRAMUSCULAR | Status: DC | PRN
  Filled 2017-03-27: qty 5

## 2017-03-27 MED ORDER — BUPIVACAINE HCL (PF) 0.25 % IJ SOLN
INTRAMUSCULAR | Status: AC
  Filled 2017-03-27: qty 30

## 2017-03-27 MED ORDER — ONDANSETRON HCL 4 MG/2ML IJ SOLN
INTRAMUSCULAR | Status: DC | PRN
  Administered 2017-03-27: 4 mg via INTRAVENOUS

## 2017-03-27 MED ORDER — EPINEPHRINE PF 1 MG/ML IJ SOLN
INTRAMUSCULAR | Status: AC
  Filled 2017-03-27: qty 1

## 2017-03-27 SURGICAL SUPPLY — 66 items
BANDAGE ACE 6X5 VEL STRL LF (GAUZE/BANDAGES/DRESSINGS) IMPLANT
BLADE SAW 1 (BLADE) ×2 IMPLANT
CANISTER SUCT 1200ML W/VALVE (MISCELLANEOUS) ×2 IMPLANT
CANISTER SUCT 3000ML PPV (MISCELLANEOUS) ×4 IMPLANT
CAPT KNEE TOTAL 3 ×2 IMPLANT
CATH FOL LEG HOLDER (MISCELLANEOUS) ×2 IMPLANT
CATH TRAY METER 16FR LF (MISCELLANEOUS) ×2 IMPLANT
CEMENT HV SMART SET (Cement) ×4 IMPLANT
CHLORAPREP W/TINT 26ML (MISCELLANEOUS) ×4 IMPLANT
COOLER POLAR GLACIER W/PUMP (MISCELLANEOUS) ×2 IMPLANT
CUFF TOURN 24 STER (MISCELLANEOUS) IMPLANT
CUFF TOURN 30 STER DUAL PORT (MISCELLANEOUS) ×2 IMPLANT
DRAPE SHEET LG 3/4 BI-LAMINATE (DRAPES) ×4 IMPLANT
ELECT CAUTERY BLADE 6.4 (BLADE) ×2 IMPLANT
ELECT REM PT RETURN 9FT ADLT (ELECTROSURGICAL) ×2
ELECTRODE REM PT RTRN 9FT ADLT (ELECTROSURGICAL) ×1 IMPLANT
GAUZE PETRO XEROFOAM 1X8 (MISCELLANEOUS) IMPLANT
GAUZE SPONGE 4X4 12PLY STRL (GAUZE/BANDAGES/DRESSINGS) IMPLANT
GLOVE BIO SURGEON STRL SZ7 (GLOVE) ×4 IMPLANT
GLOVE BIOGEL PI IND STRL 7.0 (GLOVE) ×1 IMPLANT
GLOVE BIOGEL PI IND STRL 9 (GLOVE) ×1 IMPLANT
GLOVE BIOGEL PI INDICATOR 7.0 (GLOVE) ×1
GLOVE BIOGEL PI INDICATOR 9 (GLOVE) ×1
GLOVE INDICATOR 7.5 STRL GRN (GLOVE) ×4 IMPLANT
GLOVE INDICATOR 8.0 STRL GRN (GLOVE) ×8 IMPLANT
GLOVE SURG ORTHO 8.0 STRL STRW (GLOVE) ×2 IMPLANT
GLOVE SURG SYN 9.0  PF PI (GLOVE) ×1
GLOVE SURG SYN 9.0 PF PI (GLOVE) ×1 IMPLANT
GOWN SRG 2XL LVL 4 RGLN SLV (GOWNS) ×1 IMPLANT
GOWN STRL NON-REIN 2XL LVL4 (GOWNS) ×1
GOWN STRL REUS W/ TWL LRG LVL3 (GOWN DISPOSABLE) ×3 IMPLANT
GOWN STRL REUS W/ TWL XL LVL3 (GOWN DISPOSABLE) ×1 IMPLANT
GOWN STRL REUS W/TWL LRG LVL3 (GOWN DISPOSABLE) ×3
GOWN STRL REUS W/TWL XL LVL3 (GOWN DISPOSABLE) ×1
HOOD PEEL AWAY FLYTE STAYCOOL (MISCELLANEOUS) ×4 IMPLANT
IMMBOLIZER KNEE 19 BLUE UNIV (SOFTGOODS) ×2 IMPLANT
KIT PREVENA INCISION MGT 13 (CANNISTER) ×2 IMPLANT
KIT RM TURNOVER STRD PROC AR (KITS) ×2 IMPLANT
KNIFE SCULPS 14X20 (INSTRUMENTS) ×2 IMPLANT
NDL SAFETY 18GX1.5 (NEEDLE) ×2 IMPLANT
NEEDLE FILTER BLUNT 18X 1/2SAF (NEEDLE) ×1
NEEDLE FILTER BLUNT 18X1 1/2 (NEEDLE) ×1 IMPLANT
NEEDLE SPNL 18GX3.5 QUINCKE PK (NEEDLE) ×2 IMPLANT
NEEDLE SPNL 20GX3.5 QUINCKE YW (NEEDLE) ×2 IMPLANT
NS IRRIG 1000ML POUR BTL (IV SOLUTION) ×2 IMPLANT
PACK TOTAL KNEE (MISCELLANEOUS) ×2 IMPLANT
PAD WRAPON POLAR KNEE (MISCELLANEOUS) ×1 IMPLANT
PULSAVAC PLUS IRRIG FAN TIP (DISPOSABLE) ×2
SOL .9 NS 3000ML IRR  AL (IV SOLUTION) ×1
SOL .9 NS 3000ML IRR UROMATIC (IV SOLUTION) ×1 IMPLANT
STAPLER SKIN PROX 35W (STAPLE) ×2 IMPLANT
SUCTION FRAZIER HANDLE 10FR (MISCELLANEOUS) ×1
SUCTION TUBE FRAZIER 10FR DISP (MISCELLANEOUS) ×1 IMPLANT
SUT DVC 2 QUILL PDO  T11 36X36 (SUTURE) ×1
SUT DVC 2 QUILL PDO T11 36X36 (SUTURE) ×1 IMPLANT
SUT V-LOC 90 ABS DVC 3-0 CL (SUTURE) ×2 IMPLANT
SYR 20CC LL (SYRINGE) ×2 IMPLANT
SYR 3ML 18GX1 1/2 (SYRINGE) ×2 IMPLANT
SYR 50ML LL SCALE MARK (SYRINGE) ×4 IMPLANT
SYR 5ML 18GX1 1/2 (NEEDLE) ×2 IMPLANT
SYR TB 1ML 27GX1/2 LL (SYRINGE) ×2 IMPLANT
TIP FAN IRRIG PULSAVAC PLUS (DISPOSABLE) ×1 IMPLANT
TOWEL OR 17X26 4PK STRL BLUE (TOWEL DISPOSABLE) ×2 IMPLANT
TOWER CARTRIDGE SMART MIX (DISPOSABLE) ×2 IMPLANT
WND VAC CANISTER 500ML (MISCELLANEOUS) ×2 IMPLANT
WRAPON POLAR PAD KNEE (MISCELLANEOUS) ×2

## 2017-03-27 NOTE — H&P (Signed)
Reviewed paper H+P, will be scanned into chart. No changes noted.  

## 2017-03-27 NOTE — Care Management Note (Signed)
Case Management Note  Patient Details  Name: Emily Watson MRN: [id number] Date of Birth: 1945/12/25  Subjective/Objective: RNCM consult for discharge planning. PT evaluation pending. Will follow progression and assist with discharge planning.                    Action/Plan:   Expected Discharge Date:                  Expected Discharge Plan:     In-House Referral:     Discharge planning Services  CM Consult  Post Acute Care Choice:    Choice offered to:     DME Arranged:    DME Agency:     HH Arranged:    HH Agency:     Status of Service:  In process, will continue to follow  If discussed at Long Length of Stay Meetings, dates discussed:    Additional Comments:  Marily MemosLisa M Corrado Hymon, RN 03/27/2017, 3:54 PM

## 2017-03-27 NOTE — NC FL2 (Signed)
[place] MEDICAID FL2 LEVEL OF CARE SCREENING TOOL     IDENTIFICATION  Patient Name: Emily Watson Birthdate: 08/05/1946 Sex: female Admission Date (Current Location): 03/27/2017  [place] and [place] Number:  Chiropodist and Address:  [facility], [address] [place], [place] [address]      Provider Number: [id number]  Attending Physician Name and Address:  Kennedy Bucker, MD  Relative Name and Phone Number:       Current Level of Care: Hospital Recommended Level of Care: Skilled Nursing Facility Prior Approval Number:    Date Approved/Denied:   PASRR Number:  [id number])  Discharge Plan: SNF    Current Diagnoses: Patient Active Problem List   Diagnosis Date Noted  . Status post total knee replacement, left 03/27/2017  . Skin lesions 08/18/2016  . Knee pain 08/18/2016  . Depression 08/18/2016  . Allergic rhinitis 06/07/2015  . Anaphylaxis 06/07/2015  . Carpal tunnel syndrome 06/07/2015  . Hypercholesteremia 06/07/2015  . Epicondylitis 06/07/2015  . Peripheral edema 06/07/2015  . Trigger thumb of left hand 06/07/2015  . Hypertension 06/07/2015  . Acute anxiety 06/07/2015  . Tinnitus 06/07/2015  . Aftercare following joint replacement surgery 07/21/2014  . Impingement syndrome of shoulder 07/21/2014  . Localized, primary osteoarthritis of shoulder region 12/03/2013  . GASTROINTESTINAL XRAY, ABNORMAL 03/03/2009    Orientation RESPIRATION BLADDER Height & Weight     Self, Time, Situation, Place  Normal Continent Weight: 185 lb (83.9 kg) Height:  5' 1.5" (156.2 cm)  BEHAVIORAL SYMPTOMS/MOOD NEUROLOGICAL BOWEL NUTRITION STATUS   (none)  (none ) Continent Diet (Diet: Clear Liquid to be Advanced. )  AMBULATORY STATUS COMMUNICATION OF NEEDS Skin   Extensive Assist Verbally Surgical wounds, Wound Vac (Incision: Left knee. provena wound vac on left knee. )                       Personal Care Assistance Level of  Assistance  Bathing, Feeding, Dressing Bathing Assistance: Limited assistance Feeding assistance: Independent Dressing Assistance: Limited assistance     Functional Limitations Info  Sight, Hearing, Speech Sight Info: Adequate Hearing Info: Adequate Speech Info: Adequate    SPECIAL CARE FACTORS FREQUENCY  PT (By licensed PT), OT (By licensed OT)     PT Frequency:  (5) OT Frequency:  (5)            Contractures      Additional Factors Info  Code Status, Allergies Code Status Info:  (Full Code. ) Allergies Info:  (Codeine, Chlorpheniramine-phenylephrine, Gabapentin, Other, Peppermint Oil, Triprolidine-pseudoephedrine)           Current Medications (03/27/2017):  This is the current hospital active medication list Current Facility-Administered Medications  Medication Dose Route Frequency Provider Last Rate Last Dose  . 0.9 %  sodium chloride infusion   Intravenous Continuous Kennedy Bucker, MD 75 mL/hr at 03/27/17 1300    . acetaminophen (TYLENOL) tablet 650 mg  650 mg Oral Q6H PRN Kennedy Bucker, MD       Or  . acetaminophen (TYLENOL) suppository 650 mg  650 mg Rectal Q6H PRN Kennedy Bucker, MD      . aspirin EC tablet 81 mg  81 mg Oral Daily Kennedy Bucker, MD      . bisacodyl (DULCOLAX) suppository 10 mg  10 mg Rectal Daily PRN Kennedy Bucker, MD      . ceFAZolin (ANCEF) IVPB 2g/100 mL premix  2 g Intravenous Q6H Kennedy Bucker, MD      .  diphenhydrAMINE (BENADRYL) 12.5 MG/5ML elixir 12.5-25 mg  12.5-25 mg Oral Q4H PRN Kennedy BuckerMenz, Michael, MD      . docusate sodium (COLACE) capsule 100 mg  100 mg Oral BID Kennedy BuckerMenz, Michael, MD      . Melene Muller[START ON 03/28/2017] DULoxetine (CYMBALTA) DR capsule 60 mg  60 mg Oral Daily Kennedy BuckerMenz, Michael, MD      . Melene Muller[START ON 03/28/2017] enoxaparin (LOVENOX) injection 30 mg  30 mg Subcutaneous Q12H Kennedy BuckerMenz, Michael, MD      . magnesium citrate solution 1 Bottle  1 Bottle Oral Once PRN Kennedy BuckerMenz, Michael, MD      . magnesium hydroxide (MILK OF MAGNESIA) suspension 30 mL  30 mL  Oral Daily PRN Kennedy BuckerMenz, Michael, MD      . menthol-cetylpyridinium (CEPACOL) lozenge 3 mg  1 lozenge Oral PRN Kennedy BuckerMenz, Michael, MD       Or  . phenol (CHLORASEPTIC) mouth spray 1 spray  1 spray Mouth/Throat PRN Kennedy BuckerMenz, Michael, MD      . methocarbamol (ROBAXIN) tablet 500 mg  500 mg Oral Q6H PRN Kennedy BuckerMenz, Michael, MD       Or  . methocarbamol (ROBAXIN) 500 mg in dextrose 5 % 50 mL IVPB  500 mg Intravenous Q6H PRN Kennedy BuckerMenz, Michael, MD      . metoCLOPramide (REGLAN) tablet 5-10 mg  5-10 mg Oral Q8H PRN Kennedy BuckerMenz, Michael, MD       Or  . metoCLOPramide (REGLAN) injection 5-10 mg  5-10 mg Intravenous Q8H PRN Kennedy BuckerMenz, Michael, MD      . morphine 2 MG/ML injection 2 mg  2 mg Intravenous Q1H PRN Kennedy BuckerMenz, Michael, MD      . multivitamin with minerals tablet 1 tablet  1 tablet Oral Daily Kennedy BuckerMenz, Michael, MD      . ondansetron [facility](ZOFRAN) tablet 4 mg  4 mg Oral Q6H PRN Kennedy BuckerMenz, Michael, MD       Or  . ondansetron [facility](ZOFRAN) injection 4 mg  4 mg Intravenous Q6H PRN Kennedy BuckerMenz, Michael, MD      . oxyCODONE (Oxy IR/ROXICODONE) immediate release tablet 5-10 mg  5-10 mg Oral Q3H PRN Kennedy BuckerMenz, Michael, MD      . Melene Muller[START ON 03/28/2017] pantoprazole (PROTONIX) EC tablet 40 mg  40 mg Oral Daily Kennedy BuckerMenz, Michael, MD      . zolpidem (AMBIEN) tablet 5 mg  5 mg Oral QHS PRN Kennedy BuckerMenz, Michael, MD         Discharge Medications: Please see discharge summary for a list of discharge medications.  Relevant Imaging Results:  Relevant Lab Results:   Additional Information  (SSN: [id number])  Emily Watson, Darleen CrockerBailey M, LCSW

## 2017-03-27 NOTE — Op Note (Signed)
03/27/2017  11:13 AM  PATIENT:  Emily Watson  71 y.o. female  PRE-OPERATIVE DIAGNOSIS:  PRIMARY OSTEOARTHRITIS OF LEFT KNEE  POST-OPERATIVE DIAGNOSIS:  PRIMARY OSTEOARTHRITIS OF LEFT KNEE  PROCEDURE:  Procedure(s): TOTAL KNEE ARTHROPLASTY - LEFT (Left)  SURGEON: Leitha SchullerMichael J Onalee Steinbach, MD  ASSISTANTS: Cran[facility]hris [facility]  ANESTHESIA:   spinal  EBL:  Total I/O In: -  Out: 200 [Urine:50; Blood:150]  BLOOD ADMINISTERED:none  DRAINS: none   LOCAL MEDICATIONS USED:  MARCAINE    and OTHER morphine Toradol and Exparel, TXA also injected into the joint at the close of the case  SPECIMEN:  No Specimen  DISPOSITION OF SPECIMEN:  N/A  COUNTS:  YES  TOURNIQUET:    IMPLANTS: Medacta GMK sphere left 3+ femur, 3 tibia with 10 mm insert and 2 patella all components cemented  DICTATION: .Dragon Dictation patient brought the operating room and after adequate spinal anesthesia was obtained, left leg was prepped and draped in sterile fashion was turned by the upper thigh. After patient identification and timeout procedures were completed tourniquet was raised. A midline skin incision was made followed by medial parapatellar arthrotomy. Exposure of the knee showed complete loss of medial compartment articular cartilage with eburnated bone on femoral and tibial sides with extensive wear to the lateral compartment was some exposed bone as well as exposed bone in the patellofemoral joint. The anterior cruciate ligament and fat pad were excised and the tibial extramedullary guide was placed. Proximal tibia cut was carried out and then the distal femoral cut made using the intramedullary guide and acceptable gap spaces obtained. The 3+ cutting block was applied appropriate external rotation determine and anterior posterior and chamfer cuts made with no notching. The tibia was prepared with after excision of the meniscus with proximal tibial drilling and placement of the keel followed by placement of the 3+ femoral  trial. A 10 mm spacer gave excellent good stability and full extension. Next the distal femoral drill holes were made followed by the trochlear groove cut. These trials were then removed and the patella cut using the patellar cutting guide drilled and measured a size 2. Tourniquet was let down this point and the above local anesthetic was infiltrated. The tourniquet was raised and the bony surfaces thoroughly irrigated and dried. Typical component was cemented in place first followed by the polyethylene insert with set screw tightened with the torque screwdriver. Femoral component was cemented in place and the knee held in extension with excess cement removed. Patella was clamped into place with cement as well. After the cemented set of the patella was noted to track well with no touch technique and excess cement was removed. The tourniquet was let down and the knee thoroughly irrigated with pulsatile lavage. The arthrotomy was repaired using a heavy Quill with 3-0 v-loc subcuticular closure and skin staples. A incisional wound VAC was applied. Followed by [facility]  PLAN OF CARE: Admit to inpatient   PATIENT DISPOSITION:  PACU - hemodynamically stable.

## 2017-03-27 NOTE — Anesthesia Preprocedure Evaluation (Signed)
Anesthesia Evaluation  Patient identified by MRN, date of birth, ID band Patient awake    Reviewed: Allergy & Precautions, H&P , NPO status , Patient's Chart, lab work & pertinent test results, reviewed documented beta blocker date and time   Airway Mallampati: II   Neck ROM: full    Dental  (+) Poor Dentition   Pulmonary neg pulmonary ROS, former smoker,    Pulmonary exam normal        Cardiovascular Exercise Tolerance: Good hypertension, negative cardio ROS Normal cardiovascular exam Rhythm:regular Rate:Normal     Neuro/Psych PSYCHIATRIC DISORDERS  Neuromuscular disease negative neurological ROS  negative psych ROS   GI/Hepatic negative GI ROS, Neg liver ROS, GERD  Medicated,  Endo/Other  negative endocrine ROS  Renal/GU negative Renal ROS  negative genitourinary   Musculoskeletal   Abdominal   Peds  Hematology negative hematology ROS (+)   Anesthesia Other Findings Past Medical History: No date: Allergy No date: Anaphylaxis No date: Anxiety No date: Arthritis No date: Carpal tunnel syndrome No date: Depression No date: GERD (gastroesophageal reflux disease) No date: Hypertension     Comment:  patient denies No date: Peripheral edema No date: Trigger thumb of left hand Past Surgical History: No date: CARPAL TUNNEL RELEASE 04/2009: HERNIA REPAIR No date: ROTATOR CUFF REPAIR; Left BMI    Body Mass Index:  34.39 kg/m     Reproductive/Obstetrics negative OB ROS                             Anesthesia Physical Anesthesia Plan  ASA: II  Anesthesia Plan: Spinal   Post-op Pain Management:    Induction:   PONV Risk Score and Plan: 4 or greater and Ondansetron, Dexamethasone, Midazolam and Propofol infusion  Airway Management Planned:   Additional Equipment:   Intra-op Plan:   Post-operative Plan:   Informed Consent: I have reviewed the patients History and Physical,  chart, labs and discussed the procedure including the risks, benefits and alternatives for the proposed anesthesia with the patient or authorized representative who has indicated his/her understanding and acceptance.   Dental Advisory Given  Plan Discussed with: CRNA  Anesthesia Plan Comments:         Anesthesia Quick Evaluation

## 2017-03-27 NOTE — Anesthesia Procedure Notes (Signed)
Date/Time: 03/27/2017 9:40 AM Performed by: Karoline CaldwellSTARR, Jocelin Schuelke Pre-anesthesia Checklist: Patient identified, Emergency Drugs available, Suction available and Patient being monitored Patient Re-evaluated:Patient Re-evaluated prior to induction Oxygen Delivery Method: Simple face mask

## 2017-03-27 NOTE — Anesthesia Procedure Notes (Signed)
Spinal  Patient location during procedure: OR Start time: 03/27/2017 9:27 AM End time: 03/27/2017 9:31 AM Staffing Anesthesiologist: Molli Barrows Resident/CRNA: Hedda Slade Performed: resident/CRNA  Preanesthetic Checklist Completed: patient identified, site marked, surgical consent, pre-op evaluation, timeout performed, IV checked, risks and benefits discussed and monitors and equipment checked Spinal Block Patient position: sitting Prep: ChloraPrep Patient monitoring: heart rate, continuous pulse ox, blood pressure and cardiac monitor Approach: midline Location: L4-5 Injection technique: single-shot Needle Needle type: Whitacre and Introducer  Needle gauge: 24 G Needle length: 9 cm Additional Notes Negative paresthesia. Negative blood return. Positive free-flowing CSF. Expiration date of kit checked and confirmed. Patient tolerated procedure well, without complications.

## 2017-03-27 NOTE — Transfer of Care (Signed)
Immediate Anesthesia Transfer of Care Note  Patient: Emily Watson  Procedure(s) Performed: Procedure(s): TOTAL KNEE ARTHROPLASTY - LEFT (Left)  Patient Location: PACU  Anesthesia Type:Spinal  Level of Consciousness: awake, alert  and oriented  Airway & Oxygen Therapy: Patient Spontanous Breathing and Patient connected to face mask oxygen  Post-op Assessment: Report given to RN and Post -op Vital signs reviewed and stable  Post vital signs: Reviewed and stable  Last Vitals:  Vitals:   03/27/17 0858 03/27/17 1114  BP: 139/80 (!) 101/53  Pulse: 82 74  Resp: 20 14  Temp: (!) 36.3 C 36.9 C    Last Pain:  Vitals:   03/27/17 1114  TempSrc: Temporal  PainSc:          Complications: No apparent anesthesia complications

## 2017-03-27 NOTE — Anesthesia Post-op Follow-up Note (Signed)
Anesthesia QCDR form completed.        

## 2017-03-27 NOTE — Progress Notes (Signed)
PT Cancellation Note  Patient Details Name: Emily Watson MRN: [id number] DOB: 1946-05-09   Cancelled Treatment:    Reason Eval/Treat Not Completed: Patient declined, no reason specified Pt reports feeling ill, sweaty and just "not right."  Family present and agrees that pt is probably not ready to work with PT today.  Pt states she is eager to work with PT tomorrow when she is feeling better.   Emily Watson, DPT 03/27/2017, 4:35 PM

## 2017-03-28 LAB — CBC
HCT: 38.7 % (ref 35.0–47.0)
Hemoglobin: 13.2 g/dL (ref 12.0–16.0)
MCH: 34.3 pg — AB (ref 26.0–34.0)
MCHC: 34.2 g/dL (ref 32.0–36.0)
MCV: 100.3 fL — ABNORMAL HIGH (ref 80.0–100.0)
PLATELETS: 300 10*3/uL (ref 150–440)
RBC: 3.86 MIL/uL (ref 3.80–5.20)
RDW: 14.6 % — ABNORMAL HIGH (ref 11.5–14.5)
WBC: 9 10*3/uL (ref 3.6–11.0)

## 2017-03-28 LAB — BASIC METABOLIC PANEL
Anion gap: 7 (ref 5–15)
BUN: 17 mg/dL (ref 6–20)
CALCIUM: 8.3 mg/dL — AB (ref 8.9–10.3)
CO2: 25 mmol/L (ref 22–32)
CREATININE: 0.52 mg/dL (ref 0.44–1.00)
Chloride: 103 mmol/L (ref 101–111)
GFR calc Af Amer: >60 mL/min (ref >60)
Glucose, Bld: 118 mg/dL — ABNORMAL HIGH (ref 65–99)
POTASSIUM: 4.2 mmol/L (ref 3.5–5.1)
SODIUM: 135 mmol/L (ref 135–145)

## 2017-03-28 MED ORDER — TRAMADOL HCL 50 MG PO TABS
50.0000 mg | ORAL_TABLET | Freq: Four times a day (QID) | ORAL | Status: DC | PRN
  Administered 2017-03-28: 100 mg via ORAL
  Filled 2017-03-28: qty 2

## 2017-03-28 NOTE — Progress Notes (Signed)
OT Cancellation Note  Patient Details Name: Emily Watson MRN: [id number] DOB: Jan 04, 1946   Cancelled Treatment:    Reason Eval/Treat Not Completed: Patient at procedure or test/ unavailable. Order received, chart reviewed. Pt working with PT upon attempt. Will re-attempt later this date for OT evaluation.   Richrd PrimeJamie Stiller, MPH, MS, OTR/L ascom [phone] 03/28/17, 9:35 AM

## 2017-03-28 NOTE — Progress Notes (Addendum)
Physical Therapy Treatment Patient Details Name: Emily Watson MRN: [id number] DOB: [birth year] Today's Date: 03/28/2017    History of Present Illness Pt underwent L TKR, and is POD1 for therapy evaluations. PMH includes OA, anxiety/depression, GERD, HTN, and L RTC repair.    PT Comments    Pt demonstrates improvement in her mobility this afternoon with therapy. Her stability with transfers is improved from AM session. Gait speed is increased and pain is decreased. Cues again required for proper sequencing with rolling walker. Pt still becomes relatively easily fatigued but is less anxious during the afternoon. Pt will need to be able to complete a lap around RN station as well as stair training tomorrow in order to be able to return home safely. Pt will benefit from PT services to address deficits in strength, balance, and mobility in order to return to full function at home.     Follow Up Recommendations  Home health PT;Supervision - Intermittent     Equipment Recommendations  Rolling walker with 5" wheels;3in1 (PT)    Recommendations for Other Services OT consult     Precautions / Restrictions Precautions Precautions: Knee Precaution Booklet Issued: Yes (comment) Required Braces or Orthoses: Knee Immobilizer - Left Knee Immobilizer - Left: Discontinue once straight leg raise with < 10 degree lag Restrictions Weight Bearing Restrictions: Yes LLE Weight Bearing: Weight bearing as tolerated    Mobility  Bed Mobility Overal bed mobility: Needs Assistance Bed Mobility: Sit to Supine       Sit to supine: Mod assist   General bed mobility comments: Pt requires modA+1 for bilateral LE support when returning to bed. Education about using RLE to assist LLE when returning to bed  Transfers Overall transfer level: Needs assistance Equipment used: Rolling walker (2 wheeled) Transfers: Sit to/from Stand Sit to Stand: Min guard         General transfer comment: Pt demonstrates  improved sequencing with sit to stand transfers. [facility] Pt with safe hand placement during transfers  Ambulation/Gait Ambulation/Gait assistance: Min guard Ambulation Distance (Feet): 100 Feet Assistive device: Rolling walker (2 wheeled) Gait Pattern/deviations: Decreased step length - right;Decreased stance time - left;Decreased weight shift to left;Antalgic Gait velocity: Decreased Gait velocity interpretation: <1.8 ft/sec, indicative of risk for recurrent falls General Gait Details: Pt with improved ambulation this afternoon. Speed is increased compared to AM session. Cues again required for proper sequencing with rolling walker. Pt becomes fatigued again but less anxious than during AM session   Stairs            Wheelchair Mobility    Modified Rankin (Stroke Patients Only)       Balance Overall balance assessment: Needs assistance Sitting-balance support: No upper extremity supported Sitting balance-Leahy Scale: Good     Standing balance support: No upper extremity supported Standing balance-Leahy Scale: Fair Standing balance comment: Pt requires bilateral UE support on rolling walker for stabiltiy in standing                            Cognition Arousal/Alertness: Awake/alert Behavior During Therapy: Anxious Overall Cognitive Status: Within Functional Limits for tasks assessed                                        Exercises Total Joint Exercises Ankle Circles/Pumps: Strengthening;Left;10 reps;Seated Hip ABduction/ADduction: Strengthening;Left;10 reps;Seated Long Arc Quad:  Strengthening;Left;10 reps;Seated Knee Flexion: Strengthening;Left;10 reps;Seated Marching in Standing: Strengthening;Left;10 reps;Seated    General Comments        Pertinent Vitals/Pain Pain Assessment: 0-10 Pain Score: 8  Pain Location: L knee Pain Descriptors / Indicators: Aching;Operative site guarding Pain Intervention(s):  Premedicated before session    Home Living                      Prior Function            PT Goals (current goals can now be found in the care plan section) Acute Rehab PT Goals Patient Stated Goal: Return to prior function at home PT Goal Formulation: With patient/family Time For Goal Achievement: 04/11/17 Potential to Achieve Goals: Good Progress towards PT goals: Progressing toward goals    Frequency    BID      PT Plan Current plan remains appropriate    Co-evaluation              AM-PAC PT "6 Clicks" Daily Activity  Outcome Measure  Difficulty turning over in bed (including adjusting bedclothes, sheets and blankets)?: Total Difficulty moving from lying on back to sitting on the side of the bed? : Total Difficulty sitting down on and standing up from a chair with arms (e.g., wheelchair, bedside commode, etc,.)?: A Little Help needed moving to and from a bed to chair (including a wheelchair)?: A Little Help needed walking in hospital room?: A Little Help needed climbing 3-5 steps with a railing? : A Lot 6 Click Score: 13    End of Session Equipment Utilized During Treatment: Gait belt Activity Tolerance: Patient tolerated treatment well Patient left: with SCD's reapplied;Other (comment);in bed;with bed alarm set;with call bell/phone within reach (towel roll under heel, polar care in place, wound vac on)   PT Visit Diagnosis: Unsteadiness on feet (R26.81);Muscle weakness (generalized) (M62.81);Pain Pain - Right/Left: Left Pain - part of body: Knee     Time: 1350-1420 PT Time Calculation (min) (ACUTE ONLY): 30 min  Charges:  $Gait Training: 8-22 mins $Therapeutic Exercise: 8-22 mins                    G Codes:       Emily Watson PT, DPT     Severiano Utsey 03/28/2017, 8:02 PM

## 2017-03-28 NOTE — Evaluation (Signed)
Occupational Therapy Evaluation Patient Details Name: Emily Watson MRN: [id number] DOB: [birth year] Today's Date: 03/28/2017    History of Present Illness Pt underwent L TKR, and is POD1 for therapy evaluations. PMH includes OA, anxiety/depression, GERD, HTN, and L RTC repair.   Clinical Impression   Pt seen for OT evaluation this date. Pt was independent at baseline, eager to return to PLOF. Pt presents with significant pain (RN notified), and requiring min assist for LB ADL tasks. Pt limited with mobility this session due to fatigue after PT and increased pain. Pt/daughter educated in use of AE for LB dressing and bathing tasks as well as use of BSC for beside bed overnight, over low commode during the day, and in shower. Pt/daughter verbalized understanding. Pt will benefit from skilled OT services to address noted impairments in pain, strength, ROM, and knowledge of AE for self care skills in order to maximize safety and functional independence with ADL/IADL. Recommend HHOT services and supervision following this hospitalization.     Follow Up Recommendations  Home health OT;Supervision - Intermittent    Equipment Recommendations  3 in 1 bedside commode    Recommendations for Other Services       Precautions / Restrictions Precautions Precautions: Knee Precaution Booklet Issued: No Required Braces or Orthoses: Knee Immobilizer - Left Knee Immobilizer - Left: Discontinue once straight leg raise with < 10 degree lag Restrictions Weight Bearing Restrictions: Yes LLE Weight Bearing: Weight bearing as tolerated      Mobility Bed Mobility     General bed mobility comments: deferred 2:2 up in recliner for session  Transfers         General transfer comment: deferred 2:2 pain, RN notified of need for pain meds    Balance Overall balance assessment: Needs assistance Sitting-balance support: No upper extremity supported Sitting balance-Leahy Scale: Good                                ADL either performed or assessed with clinical judgement   ADL Overall ADL's : Needs assistance/impaired Eating/Feeding: Sitting;Set up   Grooming: Sitting;Set up   Upper Body Bathing: Sitting;Set up   Lower Body Bathing: Minimal assistance;Sitting/lateral leans   Upper Body Dressing : Set up;Sitting   Lower Body Dressing: Minimal assistance;Sitting/lateral leans     Toilet Transfer Details (indicate cue type and reason): deferred 2:2 pain           General ADL Comments: pt generally min assist for LB ADL, pt/daughter educated in AE for LB ADL tasks with verbal instruction and visual demonstration. Pt verbalized understanding, politely declined trialing herself at this time due to pain.      Vision Baseline Vision/History: No visual deficits Patient Visual Report: No change from baseline       Perception     Praxis      Pertinent Vitals/Pain Pain Assessment: 0-10 Pain Score: 9  Pain Location: L knee Pain Descriptors / Indicators: Aching;Operative site guarding Pain Intervention(s): Limited activity within patient's tolerance;Monitored during session;Patient requesting pain meds-RN notified;Ice applied     Hand Dominance Right   Extremity/Trunk Assessment Upper Extremity Assessment Upper Extremity Assessment: Overall WFL for tasks assessed (L shoulder flexion 4/5, due to previous L RCR, generally WFL overall)   Lower Extremity Assessment Lower Extremity Assessment: Defer to PT evaluation;LLE deficits/detail LLE Deficits / Details: Pt requires 2 finger assist for SLR, strength appears good but primarily limited by pain at this  time. Assist also required for SAQ. Reports intact sensation to light touch LLE. RLE strength appears grossly [facility]       Communication Communication Communication: No difficulties   Cognition Arousal/Alertness: Awake/alert Behavior During Therapy: Anxious Overall Cognitive Status: Within Functional Limits for  tasks assessed                                     General Comments       Exercises   Shoulder Instructions      Home Living Family/patient expects to be discharged to:: Private residence Living Arrangements: Alone Available Help at Discharge: Family (children will be able to assist as needed after hospitalization) Type of Home: Mobile home Home Access: Stairs to enter Entrance Stairs-Number of Steps: 5 Entrance Stairs-Rails: Right;Left;Can reach both Home Layout: One level     Bathroom Shower/Tub: Producer, television/film/videoWalk-in shower   Bathroom Toilet: Standard     Home Equipment: Cane - single point          Prior Functioning/Environment Level of Independence: Independent        Comments: Pt reports she was previously independent with ambualtion without assistive device. Denies falls. Reports independence with ADLs/IADLs including driving.         OT Problem List: Decreased strength;Pain      OT Treatment/Interventions: Self-care/ADL training;Therapeutic exercise;Therapeutic activities;Energy conservation;DME and/or AE instruction;Patient/family education    OT Goals(Current goals can be found in the care plan section) Acute Rehab OT Goals Patient Stated Goal: Return to prior function at home OT Goal Formulation: With patient/family Time For Goal Achievement: 04/04/17 Potential to Achieve Goals: Good  OT Frequency: Min 1X/week   Barriers to D/C:            Co-evaluation              AM-PAC PT "6 Clicks" Daily Activity     Outcome Measure Help from another person eating meals?: None Help from another person taking care of personal grooming?: None Help from another person toileting, which includes using toliet, bedpan, or urinal?: A Little Help from another person bathing (including washing, rinsing, drying)?: A Little Help from another person to put on and taking off regular upper body clothing?: None Help from another person to put on and taking off  regular lower body clothing?: A Little 6 Click Score: 21   End of Session    Activity Tolerance: Patient limited by pain Patient left: in chair;with call bell/phone within reach;with bed alarm set;with nursing/sitter in room;with family/visitor present;Other (comment);with SCD's reapplied (polar care in place)  OT Visit Diagnosis: Pain;Other abnormalities of gait and mobility (R26.89) Pain - Right/Left: Left Pain - part of body: Knee                Time: [id number] OT Time Calculation (min): 19 min Charges:  OT General Charges $OT Visit: 1 Procedure OT Evaluation $OT Eval Low Complexity: 1 Procedure OT Treatments $Self Care/Home Management : 8-22 mins G-Codes:     Richrd PrimeJamie Stiller, MPH, MS, OTR/L ascom [phone] 03/28/17, 11:07 AM

## 2017-03-28 NOTE — Progress Notes (Signed)
Clinical Social Worker (CSW) received SNF consult. PT is recommending home health. RN case manager aware of above. Please reconsult if future social work needs arise. CSW signing off.   Kamika Goodloe, LCSW [phone] 

## 2017-03-28 NOTE — Progress Notes (Signed)
Foley d/c'd at 0600 

## 2017-03-28 NOTE — Evaluation (Signed)
Physical Therapy Evaluation Patient Details Name: Emily Watson MRN: [id number] DOB: [birth year] Today's Date: 03/28/2017   History of Present Illness  Pt underwent L TKR and was feeling unwell yesterday afternoon so PT was unable to be performed. PT evaluation completed on POD#1. PMH includes OA, anxiety/depression, GERD, HTN, and L RTC repair  Clinical Impression  Pt admitted with above diagnosis. Pt currently with functional limitations due to the deficits listed below (see PT Problem List).  Pt is very anxious and pain-avoidant during PT evaluation. She moves slowly but is able to complete bed mobility with minA and transfers with CGA only. She is able to ambulate from bed to door and back to recliner. Pt moves very slowly as she is very averse to pain with weight shifting to LLE. Cues for proper sequencing with rolling walker and daughter assists with IV pole to allow therapist to guard. Pt becomes fatigued, anxious, and diaphoretic by the end of ambulation but quickly improves once seated in recliner. AAROM: 0-77 degrees and limited by pain primarily. Recommend HH PT with family support. She will need a rolling walker and BSC (low commodes at home) at discharge. Will continue to follow and update DC recommendations if pt does not progress toward goals. Pt will benefit from PT services to address deficits in strength, balance, and mobility in order to return to full function at home.      Follow Up Recommendations Home health PT;Supervision - Intermittent    Equipment Recommendations  Rolling walker with 5" wheels;3in1 (PT)    Recommendations for Other Services OT consult     Precautions / Restrictions Precautions Precautions: Knee Precaution Booklet Issued: Yes (comment) Required Braces or Orthoses: Knee Immobilizer - Left Knee Immobilizer - Left: Discontinue once straight leg raise with < 10 degree lag Restrictions Weight Bearing Restrictions: Yes LLE Weight Bearing: Weight bearing  as tolerated      Mobility  Bed Mobility Overal bed mobility: Needs Assistance Bed Mobility: Supine to Sit     Supine to sit: Min assist     General bed mobility comments: Pt requires cues and assist to go from supine to sidelying and then sitting. HOB elevated and bed rails utilized  Transfers Overall transfer level: Needs assistance Equipment used: Rolling walker (2 wheeled) Transfers: Sit to/from Stand Sit to Stand: Min guard         General transfer comment: Pt requires increased time and cues for safe hand placement for transfers. Decreased weight shifting to LLE secondary to pain. Initially unsteady in standing but improves with extended time in upright  Ambulation/Gait Ambulation/Gait assistance: Min guard;+2 safety/equipment Ambulation Distance (Feet): 20 Feet Assistive device: Rolling walker (2 wheeled) Gait Pattern/deviations: Decreased step length - right;Decreased stance time - left;Decreased weight shift to left;Antalgic Gait velocity: Decreased Gait velocity interpretation: <1.8 ft/sec, indicative of risk for recurrent falls General Gait Details: Pt is able to ambulate from bed to door and back to recliner. She moves very slowly as she is very averse to pain with weight shifting to LLE. Cues for proper sequencing with rolling walker and daughter assists with IV pole to allow therapist to guard. Pt becomes fatigued, anxious, and diaphoretic by the end of ambulation but quickly improves once seated in recliner  Stairs            Wheelchair Mobility    Modified Rankin (Stroke Patients Only)       Balance Overall balance assessment: Needs assistance Sitting-balance support: No upper extremity supported Sitting balance-Leahy Scale: Good  Standing balance support: No upper extremity supported Standing balance-Leahy Scale: Fair Standing balance comment: Pt requires bilateral UE support on rolling walker for stabiltiy in standing                              Pertinent Vitals/Pain Pain Assessment: 0-10 Pain Score: 8  Pain Location: L knee Pain Descriptors / Indicators: Operative site guarding Pain Intervention(s): Monitored during session;RN gave pain meds during session    Home Living Family/patient expects to be discharged to:: Private residence Living Arrangements: Alone Available Help at Discharge: Family Type of Home: Mobile home Home Access: Stairs to enter Entrance Stairs-Rails: Right;Left;Can reach both Entrance Stairs-Number of Steps: 5 Home Layout: One level Home Equipment: Cane - single point (no walker, no BSC, no grab bars)      Prior Function Level of Independence: Independent         Comments: Pt reports she was previously independent with ambualtion without assistive device. Denies falls. Reports independence with ADLs/IADLs     Hand Dominance        Extremity/Trunk Assessment   Upper Extremity Assessment Upper Extremity Assessment: Overall WFL for tasks assessed    Lower Extremity Assessment Lower Extremity Assessment: LLE deficits/detail LLE Deficits / Details: Pt requires 2 finger assist for SLR, strength appears good but primarily limited by pain at this time. Assist also required for SAQ. Reports intact sensation to light touch LLE. RLE strength appears grossly WFL       Communication   Communication: No difficulties  Cognition Arousal/Alertness: Awake/alert Behavior During Therapy: Anxious Overall Cognitive Status: Within Functional Limits for tasks assessed                                        General Comments      Exercises Total Joint Exercises Ankle Circles/Pumps: AROM;Both;10 reps;Supine Quad Sets: Strengthening;Both;10 reps;Supine Gluteal Sets: Strengthening;Both;10 reps;Supine Towel Squeeze: Strengthening;Both;10 reps;Supine Short Arc Quad: Strengthening;Left;10 reps;Supine Heel Slides: Strengthening;Left;10 reps;Supine Hip  ABduction/ADduction: Strengthening;Left;10 reps;Supine Straight Leg Raises: Strengthening;10 reps;Supine;Left Goniometric ROM: 0-77 AAROM, pain limited   Assessment/Plan    PT Assessment Patient needs continued PT services  PT Problem List Decreased strength;Decreased activity tolerance;Decreased range of motion;Decreased balance;Decreased mobility;Decreased knowledge of use of DME;Pain       PT Treatment Interventions DME instruction;Gait training;Stair training;Therapeutic activities;Therapeutic exercise;Balance training;Neuromuscular re-education;Patient/family education;Manual techniques    PT Goals (Current goals can be found in the Care Plan section)  Acute Rehab PT Goals Patient Stated Goal: Return to prior function at home PT Goal Formulation: With patient/family Time For Goal Achievement: 04/11/17 Potential to Achieve Goals: Good    Frequency BID   Barriers to discharge Decreased caregiver support Lives alone but will have family support at discharge    Co-evaluation               AM-PAC PT "6 Clicks" Daily Activity  Outcome Measure Difficulty turning over in bed (including adjusting bedclothes, sheets and blankets)?: Total Difficulty moving from lying on back to sitting on the side of the bed? : Total Difficulty sitting down on and standing up from a chair with arms (e.g., wheelchair, bedside commode, etc,.)?: A Lot Help needed moving to and from a bed to chair (including a wheelchair)?: A Little Help needed walking in hospital room?: A Little Help needed climbing 3-5 steps with a railing? :  A Lot 6 Click Score: 12    End of Session Equipment Utilized During Treatment: Gait belt Activity Tolerance: Patient limited by pain Patient left: in chair;with call bell/phone within reach;with chair alarm set;with SCD's reapplied;Other (comment) (towel roll under heel, polar care in place, wound vac on)   PT Visit Diagnosis: Unsteadiness on feet (R26.81);Muscle  weakness (generalized) (M62.81);Pain Pain - Right/Left: Left Pain - part of body: Knee    Time: 0923-1008 PT Time Calculation (min) (ACUTE ONLY): 45 min   Charges:   PT Evaluation $PT Eval Low Complexity: 1 Low PT Treatments $Therapeutic Exercise: 8-22 mins   PT G Codes:   PT G-Codes **NOT FOR INPATIENT CLASS** Functional Assessment Tool Used: AM-PAC 6 Clicks Basic Mobility Functional Limitation: Mobility: Walking and moving around Mobility: Walking and Moving Around Current Status (W2956): At least 60 percent but less than 80 percent impaired, limited or restricted Mobility: Walking and Moving Around Goal Status [phone]): At least 20 percent but less than 40 percent impaired, limited or restricted    Lynnea Maizes PT, DPT   Emily Watson 03/28/2017, 10:41 AM

## 2017-03-28 NOTE — Progress Notes (Signed)
   Subjective: 1 Day Post-Op Procedure(s) (LRB): TOTAL KNEE ARTHROPLASTY - LEFT (Left) Patient reports pain as moderate.   Patient is well, and has had no acute complaints or problems Denies any CP, SOB, ABD pain. We will start therapy today.  Plan is to go Home after hospital stay.  Objective: Vital signs in last 24 hours: Temp:  [97.3 F (36.3 C)-98.7 F (37.1 C)] 98.2 F (36.8 C) (08/08 0729) Pulse Rate:  [68-85] 73 (08/08 0729) Resp:  [10-20] 19 (08/07 2307) BP: (101-157)/(52-85) 157/76 (08/08 0729) SpO2:  [91 %-100 %] 95 % (08/08 0729) Weight:  [83.9 kg (185 lb)] 83.9 kg (185 lb) (08/07 0858)  Intake/Output from previous day: 08/07 0701 - 08/08 0700 In: 2010 [I.V.:1960; IV Piggyback:50] Out: 725 [Urine:575; Blood:150] Intake/Output this shift: No intake/output data recorded.   Recent Labs  03/27/17 1327 03/28/17 0303  HGB 11.9* 13.2    Recent Labs  03/27/17 1327 03/28/17 0303  WBC 16.0* 9.0  RBC 3.49* 3.86  HCT 34.2* 38.7  PLT 323 300    Recent Labs  03/27/17 1327 03/28/17 0303  NA  --  135  K  --  4.2  CL  --  103  CO2  --  25  BUN  --  17  CREATININE 0.60 0.52  GLUCOSE  --  118*  CALCIUM  --  8.3*   No results for input(s): LABPT, INR in the last 72 hours.  EXAM General - Patient is Alert, Appropriate and Oriented Extremity - Neurovascular intact Sensation intact distally Intact pulses distally Dorsiflexion/Plantar flexion intact No cellulitis present Compartment soft Dressing - dressing C/D/I and no drainage, wound vac intact Motor Function - intact, moving foot and toes well on exam.   Past Medical History:  Diagnosis Date  . Allergy   . Anaphylaxis   . Anxiety   . Arthritis   . Carpal tunnel syndrome   . Depression   . GERD (gastroesophageal reflux disease)   . Hypertension    patient denies  . Peripheral edema   . Trigger thumb of left hand     Assessment/Plan:   1 Day Post-Op Procedure(s) (LRB): TOTAL KNEE  ARTHROPLASTY - LEFT (Left) Active Problems:   Status post total knee replacement, left  Estimated body mass index is 34.39 kg/m as calculated from the following:   Height as of this encounter: 5' 1.5" (1.562 m).   Weight as of this encounter: 83.9 kg (185 lb). Advance diet Up with therapy  Needs BM Recheck labs in the am CM to assist with discharge  DVT Prophylaxis - Lovenox, Foot Pumps and TED hose Weight-Bearing as tolerated to left leg   T. Cranston Neighborhris Gaines, PA-C [facility] 03/28/2017, 8:13 AM

## 2017-03-28 NOTE — Anesthesia Postprocedure Evaluation (Signed)
Anesthesia Post Note  Patient: Salli Realheresa Bowland  Procedure(s) Performed: Procedure(s) (LRB): TOTAL KNEE ARTHROPLASTY - LEFT (Left)  Patient location during evaluation: Nursing Unit Anesthesia Type: Spinal Level of consciousness: awake, awake and alert and oriented Pain management: pain level controlled Vital Signs Assessment: post-procedure vital signs reviewed and stable Respiratory status: spontaneous breathing, nonlabored ventilation and respiratory function stable Cardiovascular status: blood pressure returned to baseline and stable Postop Assessment: no headache and no backache Anesthetic complications: no     Last Vitals:  Vitals:   03/27/17 2307 03/28/17 0729  BP: (!) 120/58 (!) 157/76  Pulse: 68 73  Resp: 19   Temp: 37.1 C 36.8 C    Last Pain:  Vitals:   03/28/17 0729  TempSrc: Oral  PainSc:                  Ginger CarneStephanie Khup Sapia

## 2017-03-28 NOTE — Care Management Note (Addendum)
Case Management Note  Patient Details  Name: Emily Watson MRN: [id number] Date of Birth: 1945/10/05  Subjective/Objective:   POD #  1 left knee arthroplasty. Met with patient and her daughter, Emily Watson [phone]) at bedside. Daughter ask that home health call her to set up schedule. She lives alone but has children that will be stopping in to check on her. Prior to admission, patient was independent with adls. Offered choice of home health agencies. Referral to [facility] for [place] and [place]. Ordered a 3 in 1 and rolling walker from [facility]. [facility]acy: [facility]: [phone]. Called Lovenox 40 mg # 14 no refills. PCp is [facility].                 Action/Plan: [facility] for PT and OT. Lovenox called in. DME: walker and 3 in 1 from [facility].   Expected Discharge Date:                  Expected Discharge Plan:  [place]  In-House Referral:     Discharge planning Services  CM Consult  Post Acute Care Choice:  Durable Medical Equipment, Home Health Choice offered to:  Patient, Adult Children  DME Arranged:  3-N-1, Walker rolling DME Agency:  [place]:  PT [place]:  [facility]  Status of Service:  In process, will continue to follow  If discussed at Long Length of Stay Meetings, dates discussed:    Additional Comments:  Jolly Mango, RN 03/28/2017, 11:16 AM

## 2017-03-29 LAB — BASIC METABOLIC PANEL
Anion gap: 7 (ref 5–15)
BUN: 8 mg/dL (ref 6–20)
CO2: 27 mmol/L (ref 22–32)
Calcium: 8.6 mg/dL — ABNORMAL LOW (ref 8.9–10.3)
Chloride: 104 mmol/L (ref 101–111)
Creatinine, Ser: 0.6 mg/dL (ref 0.44–1.00)
GFR calc Af Amer: >60 mL/min (ref >60)
GLUCOSE: 117 mg/dL — AB (ref 65–99)
POTASSIUM: 3.6 mmol/L (ref 3.5–5.1)
Sodium: 138 mmol/L (ref 135–145)

## 2017-03-29 LAB — CBC
HEMATOCRIT: 35.5 % (ref 35.0–47.0)
Hemoglobin: 12.3 g/dL (ref 12.0–16.0)
MCH: 34.1 pg — AB (ref 26.0–34.0)
MCHC: 34.7 g/dL (ref 32.0–36.0)
MCV: 98.2 fL (ref 80.0–100.0)
Platelets: 290 10*3/uL (ref 150–440)
RBC: 3.62 MIL/uL — ABNORMAL LOW (ref 3.80–5.20)
RDW: 14.1 % (ref 11.5–14.5)
WBC: 8.2 10*3/uL (ref 3.6–11.0)

## 2017-03-29 MED ORDER — TRAMADOL HCL 50 MG PO TABS: 50.0000 mg | ORAL_TABLET | ORAL | 0 refills | Status: DC | PRN

## 2017-03-29 MED ORDER — ENOXAPARIN SODIUM 40 MG/0.4ML ~~LOC~~ SOLN: 40.0000 mg | SUBCUTANEOUS | 0 refills | Status: DC

## 2017-03-29 MED ORDER — OXYCODONE HCL 5 MG PO TABS: 5.0000 mg | ORAL_TABLET | ORAL | 0 refills | Status: DC | PRN

## 2017-03-29 NOTE — Progress Notes (Signed)
Physical Therapy Treatment Patient Details Name: Emily Watson MRN: [id number] DOB: 03/25/1946 Today's Date: 03/29/2017    History of Present Illness Pt underwent L TKR, and is POD1 for therapy evaluations. PMH includes OA, anxiety/depression, GERD, HTN, and L RTC repair.    PT Comments    Pt requesting to void upon entering room.  To edge of bed with min assist due to LE pain.  Transferred to bedside commode to void with overall poor transfer quality and min/mod verbal and tactile cues for hand placements.  Returned to supine with mod a x 1.    Pt unable to ambulate at this time or tolerate exercises.  Nursing called and pain medication was requested.  Will return this am for further mobility and exercises.    Discussed with Child psychotherapist regarding option for rehab.  Will further assess mobility and appropriateness of discharge home later this am when pain is controlled but pt may be appropriate for rehab vs HHPT at this point.    Follow Up Recommendations  Home health PT;Supervision - Intermittent;Other (comment)  See above.     Equipment Recommendations  Rolling walker with 5" wheels;3in1 (PT)    Recommendations for Other Services       Precautions / Restrictions Precautions Precautions: Knee Required Braces or Orthoses: Knee Immobilizer - Left Knee Immobilizer - Left: Discontinue once straight leg raise with < 10 degree lag Restrictions Weight Bearing Restrictions: Yes LLE Weight Bearing: Weight bearing as tolerated    Mobility  Bed Mobility Overal bed mobility: Needs Assistance Bed Mobility: Sit to Supine     Supine to sit: Min assist Sit to supine: Mod assist   General bed mobility comments: Pt requires modA+1 for bilateral LE support when returning to bed. Education about using RLE to assist LLE when returning to bed  Transfers Overall transfer level: Needs assistance Equipment used: Rolling walker (2 wheeled) Transfers: Sit to/from Stand Sit to Stand: Min  assist         General transfer comment: verbal and tactile cues for hand placements multiple times during session  Ambulation/Gait   Ambulation Distance (Feet): 2 Feet Assistive device: Rolling walker (2 wheeled) Gait Pattern/deviations: Decreased step length - right;Decreased stance time - left;Decreased weight shift to left;Antalgic Gait velocity: Decreased Gait velocity interpretation: <1.8 ft/sec, indicative of risk for recurrent falls General Gait Details: limited due to pain.  requested pain meds.   Stairs            Wheelchair Mobility    Modified Rankin (Stroke Patients Only)       Balance Overall balance assessment: Needs assistance Sitting-balance support: No upper extremity supported Sitting balance-Leahy Scale: Good     Standing balance support: Bilateral upper extremity supported Standing balance-Leahy Scale: Fair Standing balance comment: Pt requires bilateral UE support on rolling walker for stabiltiy in standing                            Cognition Arousal/Alertness: Awake/alert Behavior During Therapy: Anxious Overall Cognitive Status: Within Functional Limits for tasks assessed                                        Exercises Other Exercises Other Exercises: Pt needing to void upon arrival.  Unable to ambulate to bathroom due to pain.  Bedside commode used with overall poor transfer with min assist.  General Comments        Pertinent Vitals/Pain      Home Living                      Prior Function            PT Goals (current goals can now be found in the care plan section)      Frequency    BID      PT Plan Other (comment)    Co-evaluation              AM-PAC PT "6 Clicks" Daily Activity  Outcome Measure  Difficulty turning over in bed (including adjusting bedclothes, sheets and blankets)?: Total Difficulty moving from lying on back to sitting on the side of the bed? :  Total Difficulty sitting down on and standing up from a chair with arms (e.g., wheelchair, bedside commode, etc,.)?: Total Help needed moving to and from a bed to chair (including a wheelchair)?: A Little Help needed walking in hospital room?: A Little Help needed climbing 3-5 steps with a railing? : A Lot 6 Click Score: 11    End of Session Equipment Utilized During Treatment: Gait belt Activity Tolerance: Patient limited by pain Patient left: in bed;with bed alarm set;with call bell/phone within reach;with family/visitor present Nurse Communication: Patient requests pain meds Pain - Right/Left: Left Pain - part of body: Knee     Time: [id number] PT Time Calculation (min) (ACUTE ONLY): 27 min  Charges:  $Therapeutic Activity: 8-22 mins                    G Codes:       Danielle DessSarah Amori Colomb, PTA 03/29/17, 9:45 AM

## 2017-03-29 NOTE — Progress Notes (Signed)
Occupational Therapy Treatment Patient Details Name: Emily Watson MRN: [id number] DOB: [birth year] Today's Date: 03/29/2017    History of present illness Pt underwent L TKR, and is POD1 for therapy evaluations. PMH includes OA, anxiety/depression, GERD, HTN, and L RTC repair.   OT comments  Pt seen for OT treatment session this date. Pt significantly limited due to pain this afternoon. Pt generally min assist for LB ADL, mod assist for toileting. Pt/son educated in AE for LB ADL tasks with verbal instruction and visual demonstration. Pt/son verbalized understanding, pt unable to attempt due to 9/10 pain. Pt has not had pain medication since early this morning. Pt/son educated in pain mgt, compression stocking mgt, and general rehabilitation process, both verbalized understanding. RN notified, brought pt pain meds. Based on pt's lack of progress, pt more appropriate for discharge to STR prior to return home to maximize safety and functional return to PLOF.    Follow Up Recommendations  SNF    Equipment Recommendations  3 in 1 bedside commode    Recommendations for Other Services      Precautions / Restrictions Precautions Precautions: Knee Precaution Booklet Issued: Yes (comment) Precaution Comments: KI used as she was unable to initiate SLR today  Required Braces or Orthoses: Knee Immobilizer - Left Knee Immobilizer - Left: Discontinue once straight leg raise with < 10 degree lag Restrictions Weight Bearing Restrictions: Yes LLE Weight Bearing: Weight bearing as tolerated       Mobility Bed Mobility      General bed mobility comments: deferred due to pain  Transfers         General transfer comment: deferred due to pain    Balance                            ADL either performed or assessed with clinical judgement   ADL Overall ADL's : Needs assistance/impaired                                       General ADL Comments: pt generally  min assist for LB ADL, pt/son educated in AE for LB ADL tasks with verbal instruction and visual demonstration. Pt/son verbalized understanding. Pt/son educated in pain mgt and compression stocking mgt, both verbalized understanding.     Vision Baseline Vision/History: No visual deficits Patient Visual Report: No change from baseline     Perception     Praxis      Cognition Arousal/Alertness: Awake/alert Behavior During Therapy: WFL for tasks assessed/performed Overall Cognitive Status: Within Functional Limits for tasks assessed                                          Exercises  Other Exercises Other Exercises: Pt/son educated in pain mgt and compression stocking mgt, both verbalized understanding   Shoulder Instructions       General Comments      Pertinent Vitals/ Pain       Pain Assessment: 0-10 Pain Score: 9  Pain Location: L knee Pain Descriptors / Indicators: Aching;Operative site guarding Pain Intervention(s): Limited activity within patient's tolerance;Monitored during session;RN gave pain meds during session;Patient requesting pain meds-RN notified;Repositioned;Ice applied  Home Living  Prior Functioning/Environment              Frequency  Min 1X/week        Progress Toward Goals  OT Goals(current goals can now be found in the care plan section)  Progress towards OT goals: Not progressing toward goals - comment;OT to reassess next treatment (limited by pain this session)  Acute Rehab OT Goals Patient Stated Goal: Return to prior function at home OT Goal Formulation: With patient/family Time For Goal Achievement: 04/04/17 Potential to Achieve Goals: Good  Plan Frequency remains appropriate;Discharge plan needs to be updated    Co-evaluation                 AM-PAC PT "6 Clicks" Daily Activity     Outcome Measure   Help from another person eating meals?:  None Help from another person taking care of personal grooming?: None Help from another person toileting, which includes using toliet, bedpan, or urinal?: A Lot Help from another person bathing (including washing, rinsing, drying)?: A Little Help from another person to put on and taking off regular upper body clothing?: None Help from another person to put on and taking off regular lower body clothing?: A Lot 6 Click Score: 19    End of Session    OT Visit Diagnosis: Pain;Other abnormalities of gait and mobility (R26.89) Pain - Right/Left: Left Pain - part of body: Knee   Activity Tolerance Patient limited by pain   Patient Left in bed;with call bell/phone within reach;with bed alarm set;with family/visitor present;with SCD's reapplied;Other (comment) (polar care in place)   Nurse Communication Patient requests pain meds    Functional Assessment Tool Used: AM-PAC 6 Clicks Daily Activity;Clinical judgement Functional Limitation: Self care Self Care Current Status (B1478): At least 40 percent but less than 60 percent impaired, limited or restricted Self Care Goal Status (G9562): At least 20 percent but less than 40 percent impaired, limited or restricted   Time: 1327-1403 OT Time Calculation (min): 36 min  Charges: OT G-codes **NOT FOR INPATIENT CLASS** Functional Assessment Tool Used: AM-PAC 6 Clicks Daily Activity;Clinical judgement Functional Limitation: Self care Self Care Current Status (Z3086): At least 40 percent but less than 60 percent impaired, limited or restricted Self Care Goal Status (V7846): At least 20 percent but less than 40 percent impaired, limited or restricted OT General Charges $OT Visit: 1 Procedure OT Treatments $Self Care/Home Management : 23-37 mins $Therapeutic Activity: 23-37 mins  Richrd Prime, MPH, MS, OTR/L ascom [phone] 03/29/17, 2:40 PM

## 2017-03-29 NOTE — Progress Notes (Signed)
PT Cancellation Note  Patient Details Name: Emily Watson MRN: [id number] DOB: 1945/12/03   Cancelled Treatment:    Reason Eval/Treat Not Completed: Patient declined, no reason specified Pt has already been seen x2 today, but PT offered a 3rd session this afternoon as her first session was limited.  She reports that she has been getting up to use the restroom and is too tired and hurting to participate much with PT.  Will see tomorrow.  Malachi ProGalen R Tiasha Helvie, DPT 03/29/2017, 4:13 PM

## 2017-03-29 NOTE — Progress Notes (Signed)
Physical Therapy Treatment Patient Details Name: Emily Watson MRN: [id number] DOB: [birth year] Today's Date: 03/29/2017    History of Present Illness Pt underwent L TKR, and is POD1 for therapy evaluations. PMH includes OA, anxiety/depression, GERD, HTN, and L RTC repair.    PT Comments    Pt received pain meds prior to session and pain better under control.  Participated in exercises as described below.  Pt very resistant to supine ROM self-limiting AAROM knee flexion to minimal movements.  She does much better with knee flexion in sitting and generally tolerated it well.  KI donned as she was unable to initiate SLR today even after warm-up reps.  To edge of bed with min assist and heavy use of rails and footboard to pull herself upright.  Stood with min assist and again verbal and tactile cues for hand placements.  She was able to ambulate 60' x 2 with a seated rest break in wheelchair with frequent self initiated rest breaks.  She has occasional LOB's but was able to control without assist.  She is generally easily distracted by family/visitors which increase her risk of falling and affect her general safety with mobility.  She did state she felt better walking with KI today.    Initial recommendation for HHPT was made at eval.  Due to continued concern over general safety, decreased mobility, and pain control, recommendation changed to SNF.  Primary nurse also shared concerns along with family.  Pt is agreeable to exploring rehab options.  Discussed with care management, SWS and PT on site regarding change in recommendation.   Follow Up Recommendations  SNF     Equipment Recommendations  Rolling walker with 5" wheels;3in1 (PT)    Recommendations for Other Services       Precautions / Restrictions Precautions Precautions: Knee Precaution Booklet Issued: Yes (comment) Precaution Comments: KI used as she was unable to initiate SLR today  Required Braces or Orthoses: Knee Immobilizer -  Left Knee Immobilizer - Left: Discontinue once straight leg raise with < 10 degree lag Restrictions Weight Bearing Restrictions: Yes LLE Weight Bearing: Weight bearing as tolerated    Mobility  Bed Mobility Overal bed mobility: Needs Assistance Bed Mobility: Supine to Sit     Supine to sit: Min assist Sit to supine: Mod assist   General bed mobility comments: Pt requires modA+1 for bilateral LE support when returning to bed. Education about using RLE to assist LLE when returning to bed  Transfers Overall transfer level: Needs assistance Equipment used: Rolling walker (2 wheeled) Transfers: Sit to/from Stand Sit to Stand: Min assist         General transfer comment: verbal and tactile cues for hand placements multiple times during session  Ambulation/Gait Ambulation/Gait assistance: Min guard Ambulation Distance (Feet): 60 Feet Assistive device: Rolling walker (2 wheeled) Gait Pattern/deviations: Step-to pattern;Decreased step length - right;Decreased step length - left;Decreased stance time - right Gait velocity: Decreased Gait velocity interpretation: <1.8 ft/sec, indicative of risk for recurrent falls General Gait Details: frequent rest breaks.  Unable to ambualte lap around unit or up/down stairs this session.   Stairs            Wheelchair Mobility    Modified Rankin (Stroke Patients Only)       Balance Overall balance assessment: Needs assistance Sitting-balance support: No upper extremity supported Sitting balance-Leahy Scale: Good     Standing balance support: Bilateral upper extremity supported Standing balance-Leahy Scale: Fair Standing balance comment: Pt requires bilateral UE support  on rolling walker for stabiltiy in standing                            Cognition Arousal/Alertness: Awake/alert Behavior During Therapy: [facility] for tasks assessed/performed;Anxious Overall Cognitive Status: Within Functional Limits for tasks assessed                                         Exercises Total Joint Exercises Ankle Circles/Pumps: Strengthening;Left;10 reps;Seated Quad Sets: Strengthening;Both;10 reps;Supine Gluteal Sets: Strengthening;Both;10 reps;Supine Short Arc Quad: Strengthening;Left;10 reps;Supine;AAROM Heel Slides: Strengthening;Left;10 reps;Supine;AAROM Hip ABduction/ADduction: AAROM;Strengthening;Supine;Left;10 reps Straight Leg Raises: Strengthening;10 reps;Supine;Left;AAROM Goniometric ROM: 0-85  - resists ROM in supine with little to no flexion but did signifcantly better once seated. Other Exercises Other Exercises: Pt needing to void upon arrival.  Unable to ambulate to bathroom due to pain.  Bedside commode used with overall poor transfer with min assist.    General Comments        Pertinent Vitals/Pain Pain Assessment: 0-10 Pain Score: 5  Pain Location: L knee Pain Descriptors / Indicators: Aching;Operative site guarding Pain Intervention(s): Premedicated before session    Home Living                      Prior Function            PT Goals (current goals can now be found in the care plan section) Progress towards PT goals: Progressing toward goals    Frequency    BID      PT Plan Discharge plan needs to be updated    Co-evaluation              AM-PAC PT "6 Clicks" Daily Activity  Outcome Measure  Difficulty turning over in bed (including adjusting bedclothes, sheets and blankets)?: Total Difficulty moving from lying on back to sitting on the side of the bed? : Total Difficulty sitting down on and standing up from a chair with arms (e.g., wheelchair, bedside commode, etc,.)?: Total Help needed moving to and from a bed to chair (including a wheelchair)?: A Little Help needed walking in hospital room?: A Little Help needed climbing 3-5 steps with a railing? : A Lot 6 Click Score: 11    End of Session Equipment Utilized During Treatment: Gait  belt Activity Tolerance: Patient limited by pain;Patient limited by fatigue Patient left: in chair;with chair alarm set;with call bell/phone within reach;with family/visitor present Nurse Communication: Mobility status Pain - Right/Left: Left Pain - part of body: Knee     Time: 2956-2130 PT Time Calculation (min) (ACUTE ONLY): 44 min  Charges:  $Gait Training: 8-22 mins $Therapeutic Exercise: 8-22 mins $Therapeutic Activity: 8-22 mins                    G Codes:       Danielle Dess, PTA 03/29/17, 11:52 AM

## 2017-03-29 NOTE — Care Management (Signed)
PT recommending SNF. cancelled home health. CSW aware and following. Please call if assistance needed.  Gweneth DimitriLisa Madden Piazza, RN BSN Case Management [phone]

## 2017-03-29 NOTE — Progress Notes (Addendum)
   Subjective: 2 Days Post-Op Procedure(s) (LRB): TOTAL KNEE ARTHROPLASTY - LEFT (Left) Patient reports pain as mild.   Patient is well, and has had no acute complaints or problems Denies any CP, SOB, ABD pain. We will continue therapy today.  Plan is to go Home after hospital stay.  Objective: Vital signs in last 24 hours: Temp:  [98.2 F (36.8 C)-99.4 F (37.4 C)] 99.4 F (37.4 C) (08/09 0000) Pulse Rate:  [66-73] 70 (08/09 0000) Resp:  [18] 18 (08/09 0000) BP: (130-157)/(61-76) 146/69 (08/09 0000) SpO2:  [91 %-95 %] 91 % (08/09 0000)  Intake/Output from previous day: 08/08 0701 - 08/09 0700 In: 720 [P.O.:720] Out: 1 [Urine:1] Intake/Output this shift: No intake/output data recorded.   Recent Labs  03/27/17 1327 03/28/17 0303 03/29/17 0304  HGB 11.9* 13.2 12.3    Recent Labs  03/28/17 0303 03/29/17 0304  WBC 9.0 8.2  RBC 3.86 3.62*  HCT 38.7 35.5  PLT 300 290    Recent Labs  03/28/17 0303 03/29/17 0304  NA 135 138  K 4.2 3.6  CL 103 104  CO2 25 27  BUN 17 8  CREATININE 0.52 0.60  GLUCOSE 118* 117*  CALCIUM 8.3* 8.6*   No results for input(s): LABPT, INR in the last 72 hours.  EXAM General - Patient is Alert, Appropriate and Oriented Extremity - Neurovascular intact Sensation intact distally Intact pulses distally Dorsiflexion/Plantar flexion intact No cellulitis present Compartment soft Dressing - dressing C/D/I and no drainage, wound vac intact, no drainage Motor Function - intact, moving foot and toes well on exam.   Past Medical History:  Diagnosis Date  . Allergy   . Anaphylaxis   . Anxiety   . Arthritis   . Carpal tunnel syndrome   . Depression   . GERD (gastroesophageal reflux disease)   . Hypertension    patient denies  . Peripheral edema   . Trigger thumb of left hand     Assessment/Plan:   2 Days Post-Op Procedure(s) (LRB): TOTAL KNEE ARTHROPLASTY - LEFT (Left) Active Problems:   Status post total knee replacement,  left  Estimated body mass index is 34.39 kg/m as calculated from the following:   Height as of this encounter: 5' 1.5" (1.562 m).   Weight as of this encounter: 83.9 kg (185 lb). Advance diet Up with therapy  Needs BM Labs stable Encouraged incentive spirometer CM to assist with discharge, likely home with HHPT Friday Remove wound vac and apply honey comb dressing on 04/06/17  DVT Prophylaxis - Lovenox, Foot Pumps and TED hose Weight-Bearing as tolerated to left leg   T. [place] Neighborhris Perry Molla, PA-C [facility] 03/29/2017, 6:44 AM

## 2017-03-29 NOTE — Discharge Summary (Signed)
Physician Discharge Summary  Patient ID: Emily Watson MRN: [id number] DOB/AGE: [birth year] 71 y.o.  Admit date: 03/27/2017 Discharge date: 03/30/2017 Admission Diagnoses:  PRIMARY OSTEOARTHRITIS OF LEFT KNEE   Discharge Diagnoses: Patient Active Problem List   Diagnosis Date Noted  . Status post total knee replacement, left 03/27/2017  . Skin lesions 08/18/2016  . Knee pain 08/18/2016  . Depression 08/18/2016  . Allergic rhinitis 06/07/2015  . Anaphylaxis 06/07/2015  . Carpal tunnel syndrome 06/07/2015  . Hypercholesteremia 06/07/2015  . Epicondylitis 06/07/2015  . Peripheral edema 06/07/2015  . Trigger thumb of left hand 06/07/2015  . Hypertension 06/07/2015  . Acute anxiety 06/07/2015  . Tinnitus 06/07/2015  . Aftercare following joint replacement surgery 07/21/2014  . Impingement syndrome of shoulder 07/21/2014  . Localized, primary osteoarthritis of shoulder region 12/03/2013  . GASTROINTESTINAL XRAY, ABNORMAL 03/03/2009    Past Medical History:  Diagnosis Date  . Allergy   . Anaphylaxis   . Anxiety   . Arthritis   . Carpal tunnel syndrome   . Depression   . GERD (gastroesophageal reflux disease)   . Hypertension    patient denies  . Peripheral edema   . Trigger thumb of left hand      Transfusion: none   Consultants (if any):   Discharged Condition: Improved  Hospital Course: Emily Watson is an 71 y.o. female who was admitted 03/27/2017 with a diagnosis of left knee osteoarthritis and went to the operating room on 03/27/2017 and underwent the above named procedures. The patient is still working on a bowel movement before discharge today. She has done well with stable labs. She is being discharged to rehabilitation with her wound VAC in place.   Surgeries: Procedure(s): TOTAL KNEE ARTHROPLASTY - LEFT on 03/27/2017 Patient tolerated the surgery well. Taken to PACU where she was stabilized and then transferred to the orthopedic floor.  Started on Lovenox 30 q  12 hrs. Foot pumps applied bilaterally at 80 mm. Heels elevated on bed with rolled towels. No evidence of DVT. Negative Homan. Physical therapy started on day #1 for gait training and transfer. OT started day #1 for ADL and assisted devices.  Patient's foley was d/c on day #1. Patient's IV was d/c on day #2.  On post op day #3 patient was stable and ready for discharge to home with HHPT.  Implants: Medacta GMK sphere left 3+ femur, 3 tibia with 10 mm insert and 2 patella all components cemented  She was given perioperative antibiotics:  Anti-infectives    Start     Dose/Rate Route Frequency Ordered Stop   03/27/17 1600  ceFAZolin (ANCEF) IVPB 2g/100 mL premix     2 g 200 mL/hr over 30 Minutes Intravenous Every 6 hours 03/27/17 1254 03/27/17 2119   03/27/17 0845  ceFAZolin (ANCEF) 2-4 GM/100ML-% IVPB    Comments:  Letta PateKinney, Nicole   : cabinet override      03/27/17 0845 03/27/17 0940   03/27/17 0000  ceFAZolin (ANCEF) IVPB 2g/100 mL premix     2 g 200 mL/hr over 30 Minutes Intravenous  Once 03/26/17 2353 03/27/17 0955    .  She was given sequential compression devices, early ambulation, and lovenox for DVT prophylaxis.  She benefited maximally from the hospital stay and there were no complications.    Recent vital signs:  Vitals:   03/29/17 2340 03/30/17 0600  BP: (!) 133/51   Pulse: 88   Resp: 16   Temp: 99.5 F (37.5 C) 97.8 F (36.6 C)  SpO2: 97%     Recent laboratory studies:  Lab Results  Component Value Date   HGB 12.3 03/29/2017   HGB 13.2 03/28/2017   HGB 11.9 (L) 03/27/2017   Lab Results  Component Value Date   WBC 8.2 03/29/2017   PLT 290 03/29/2017   Lab Results  Component Value Date   INR 0.89 03/14/2017   Lab Results  Component Value Date   NA 138 03/29/2017   K 3.6 03/29/2017   CL 104 03/29/2017   CO2 27 03/29/2017   BUN 8 03/29/2017   CREATININE 0.60 03/29/2017   GLUCOSE 117 (H) 03/29/2017    Discharge Medications:   Allergies as of  03/30/2017      Reactions   Codeine Other (See Comments)   Sweating and passed out.   Chlorpheniramine-phenylephrine Other (See Comments)   Skin reddened and hands peeled. She was told it was a chemical reaction between aspirin(gum) and Actifed.   Gabapentin Other (See Comments)   Constant stomach pains   Other Other (See Comments)   Macadamia nuts- lips go numb   Peppermint Oil Other (See Comments)   Difficulty breathing   Triprolidine-pseudoephedrine Other (See Comments)   Skin reddened and hands peeled. She was told it was a chemical reaction between aspirin(gum) and Actifed.      Medication List    TAKE these medications   aspirin EC 81 MG EC tablet Generic drug:  aspirin Take 81 mg by mouth daily. Swallow whole.   DULoxetine 60 MG capsule Commonly known as:  CYMBALTA Take 1 capsule (60 mg total) by mouth daily.   enoxaparin 40 MG/0.4ML injection Commonly known as:  LOVENOX Inject 0.4 mLs (40 mg total) into the skin daily.   multivitamin tablet Take 1 tablet by mouth daily.   omeprazole 20 MG tablet Commonly known as:  PRILOSEC OTC Take 1 tablet (20 mg total) by mouth daily.   oxyCODONE 5 MG immediate release tablet Commonly known as:  Oxy IR/ROXICODONE Take 1-2 tablets (5-10 mg total) by mouth every 4 (four) hours as needed for breakthrough pain.   traMADol 50 MG tablet Commonly known as:  ULTRAM Take 1-2 tablets (50-100 mg total) by mouth every 4 (four) hours as needed for moderate pain. No more than 8 tablets in 24 hour period            Durable Medical Equipment        Start     Ordered   03/27/17 1253  DME Walker rolling  Once    Question:  Patient needs a walker to treat with the following condition  Answer:  Status post total left knee replacement   03/27/17 1254   03/27/17 1253  DME 3 n 1  Once     03/27/17 1254   03/27/17 1253  DME Bedside commode  Once    Question:  Patient needs a bedside commode to treat with the following condition   Answer:  Status post total left knee replacement   03/27/17 1254      Diagnostic Studies: Dg Knee 1-2 Views Left  Result Date: 03/27/2017 CLINICAL DATA:  Status post left knee replacement. EXAM: LEFT KNEE - 1-2 VIEW COMPARISON:  Radiographs of Jan 08, 2017. FINDINGS: The femoral and tibial components are well situated. No fracture or dislocation is noted. Expected postoperative changes are seen in the soft tissues anteriorly. IMPRESSION: Status post left total knee arthroplasty. Electronically Signed   By: Lupita Raider, M.D.   On: 03/27/2017  11:56   Ct Knee Left Wo Contrast  Result Date: 03/06/2017 CLINICAL DATA:  Left knee pain for 6 months EXAM: CT OF THE LEFT KNEE WITHOUT CONTRAST TECHNIQUE: Multidetector CT imaging of the LEFT knee was performed according to the standard protocol. Multiplanar CT image reconstructions were also generated. COMPARISON:  None. FINDINGS: Bones/Joint/Cartilage The hip demonstrates no fracture or dislocation. There is no lytic or blastic lesion. There is osteoarthritis of the left sacroiliac joint. There is old posttraumatic deformity of the left inferior pubic ramus. The knee demonstrates no fracture or dislocation. There is no lytic or blastic lesion. There is severe medial femorotibial compartment joint space narrowing, marginal osteophytosis, subchondral cystic changes and subchondral sclerosis. There is mild lateral femorotibial compartment joint space narrowing with marginal osteophytosis. There is moderate lateral patellofemoral compartment joint space narrowing with marginal osteophytosis and subchondral cystic changes in the lateral patellar facet. There is no significant joint effusion. The ankle demonstrates no fracture or dislocation. There is no lytic or blastic lesion. Ligaments Ligaments are suboptimally evaluated by CT. Muscles and Tendons The muscles are normal.  There is no muscle atrophy. Soft tissue There is no fluid collection or hematoma. There is  peripheral vascular atherosclerotic disease. IMPRESSION: 1. Tricompartmental osteoarthritis of the left knee most severe in the medial femorotibial compartment. Electronically Signed   By: Elige Ko   On: 03/06/2017 09:22    Disposition: Final discharge disposition not confirmed    Follow-up Information    Kennedy Bucker, MD Follow up in 2 week(s).   Specialty:  Orthopedic Surgery Contact information: [address] [facility] [place] [address] [phone]            SignedLenard Forth, Gordon Carlson 03/30/2017, 7:24 AM

## 2017-03-30 DIAGNOSIS — F39 Unspecified mood [affective] disorder: Secondary | ICD-10-CM | POA: Diagnosis not present

## 2017-03-30 DIAGNOSIS — K219 Gastro-esophageal reflux disease without esophagitis: Secondary | ICD-10-CM | POA: Diagnosis not present

## 2017-03-30 DIAGNOSIS — R6889 Other general symptoms and signs: Secondary | ICD-10-CM | POA: Diagnosis not present

## 2017-03-30 DIAGNOSIS — Z471 Aftercare following joint replacement surgery: Secondary | ICD-10-CM | POA: Diagnosis not present

## 2017-03-30 DIAGNOSIS — M6281 Muscle weakness (generalized): Secondary | ICD-10-CM | POA: Diagnosis not present

## 2017-03-30 DIAGNOSIS — Z96652 Presence of left artificial knee joint: Secondary | ICD-10-CM | POA: Diagnosis not present

## 2017-03-30 DIAGNOSIS — R262 Difficulty in walking, not elsewhere classified: Secondary | ICD-10-CM | POA: Diagnosis not present

## 2017-03-30 DIAGNOSIS — M1990 Unspecified osteoarthritis, unspecified site: Secondary | ICD-10-CM | POA: Diagnosis not present

## 2017-03-30 DIAGNOSIS — Z741 Need for assistance with personal care: Secondary | ICD-10-CM | POA: Diagnosis not present

## 2017-03-30 DIAGNOSIS — M179 Osteoarthritis of knee, unspecified: Secondary | ICD-10-CM | POA: Diagnosis not present

## 2017-03-30 DIAGNOSIS — Z7401 Bed confinement status: Secondary | ICD-10-CM | POA: Diagnosis not present

## 2017-03-30 NOTE — Progress Notes (Addendum)
Gave report to Fox Chaseheryl at [facility]. Patient going to room 315. Nurse removed IV, gave patient dc packet. Changed over to prevena. NT prepared patient for transport via EMS and packed belongings.  EMS called.

## 2017-03-30 NOTE — Clinical Social Work Placement (Signed)
   CLINICAL SOCIAL WORK PLACEMENT  NOTE  Date:  03/30/2017  Patient Details  Name: Emily Watson MRN: [id number] Date of Birth: [birth year]  Clinical Social Work is seeking post-discharge placement for this patient at the Skilled  Nursing Facility level of care (*CSW will initial, date and re-position this form in  chart as items are completed):  Yes   Patient/family provided with [facility] Clinical Social Work Department's list of facilities offering this level of care within the geographic area requested by the patient (or if unable, by the patient's family).  Yes   Patient/family informed of their freedom to choose among providers that offer the needed level of care, that participate in Medicare, Medicaid or managed care program needed by the patient, have an available bed and are willing to accept the patient.  Yes   Patient/family informed of [facility]'s ownership interest in [facility] and [facility], as well as of the fact that they are under no obligation to receive care at these facilities.  PASRR submitted to EDS on 03/28/17     PASRR number received on 03/28/17     Existing PASRR number confirmed on       FL2 transmitted to all facilities in geographic area requested by pt/family on 03/29/17     FL2 transmitted to all facilities within larger geographic area on       Patient informed that his/her managed care company has contracts with or will negotiate with certain facilities, including the following:        Yes   Patient/family informed of bed offers received.  Patient chooses bed at  [facility] [facility]     Physician recommends and patient chooses bed at      Patient to be transferred to  [facility]([place] ) on 03/30/17.  Patient to be transferred to facility [facility]     Patient family notified on 03/30/17 of transfer.  Name of family member notified:   (Patient's granddaughter Morrie Sheldonshley is at bedside and aware of D/C today. )     PHYSICIAN       Additional Comment:    _______________________________________________ Lorisa Scheid, Darleen CrockerBailey M, LCSW 03/30/2017, 12:30 PM

## 2017-03-30 NOTE — Progress Notes (Signed)
EMS [facility] to [facility]. VSS at time of discharge.

## 2017-03-30 NOTE — Clinical Social Work Note (Signed)
Clinical Social Work Assessment  Patient Details  Name: Emily Watson MRN: [id number] Date of Birth: 05/14/1946  Date of referral:  03/30/17               Reason for consult:  Facility Placement                Permission sought to share information with:  Chartered certified accountant granted to share information::  Yes, Verbal Permission Granted  Name::      [place]::   [place]   Relationship::     Contact Information:     Housing/Transportation Living arrangements for the past 2 months:  [place] of Information:  Patient, Adult Children Patient Interpreter Needed:  None Criminal Activity/Legal Involvement Pertinent to Current Situation/Hospitalization:  No - Comment as needed Significant Relationships:  Adult Children, Other Family Members Lives with:  Self Do you feel safe going back to the place where you live?  Yes Need for family participation in patient care:  Yes (Comment)  Care giving concerns:  Patient lives alone in [place].    Social Worker assessment / plan:  Holiday representative (CSW) received SNF consult. PT was recommending home health however they changed the recommendation to SNF. CSW met with patient and her granddaughter Emily Watson was at bedside. CSW introduced self and explained role of CSW department. Patient was alert and oriented X4 and was sitting up in the chair at bedside. Patient lives alone in [place] and is agreeable to SNF search in [place]. FL2 complete and faxed out.   CSW presented bed offers to patient and she chose [facility]. Patient is medically stable for D/C to [facility] today. Per Seth Bake admissions coordinator at [facility] patient can come today to room 330. RN will call report at [phone] and will arrange EMS. CSW sent D/C orders to [facility] via [place]. Patient is aware of above. Patient's granddaughter Emily Watson is at bedside and aware of above. Please  reconsult if future social work needs arise. CSW signing off.    Employment status:  Retired Forensic scientist:  Medicare PT Recommendations:  [place] / Referral to community resources:  [place]  Patient/Family's Response to care:  Patient is agreeable to going to [facility] today.   Patient/Family's Understanding of and Emotional Response to Diagnosis, Current Treatment, and Prognosis:  Patient and her granddaughter were very pleasant and thanked CSW for assistance.   Emotional Assessment Appearance:  Appears stated age Attitude/Demeanor/Rapport:    Affect (typically observed):  Accepting, Adaptable, Pleasant Orientation:  Oriented to Self, Oriented to Place, Oriented to  Time, Oriented to Situation Alcohol / Substance use:  Not Applicable Psych involvement (Current and /or in the community):  No (Comment)  Discharge Needs  Concerns to be addressed:  Discharge Planning Concerns Readmission within the last 30 days:  No Current discharge risk:  Dependent with Mobility Barriers to Discharge:  Continued Medical Work up   [employer], Veronia Beets, LCSW 03/30/2017, 12:31 PM

## 2017-03-30 NOTE — Care Management Important Message (Signed)
Important Message  Patient Details  Name: Salli Realheresa Rachels MRN: [id number] Date of Birth: February 25, 1946   Medicare Important Message Given:  Yes    Marily MemosLisa M Victora Irby, RN 03/30/2017, 8:05 AM

## 2017-03-30 NOTE — Progress Notes (Addendum)
   Subjective: 3 Days Post-Op Procedure(s) (LRB): TOTAL KNEE ARTHROPLASTY - LEFT (Left) Patient reports pain as mild.   Patient is well, and has had no acute complaints or problems Denies any CP, SOB, ABD pain. We will continue therapy today.  Plan is to go to rehabilitation after hospital stay.  Objective: Vital signs in last 24 hours: Temp:  [99.4 F (37.4 C)-99.5 F (37.5 C)] 99.5 F (37.5 C) (08/09 2340) Pulse Rate:  [65-88] 88 (08/09 2340) Resp:  [16] 16 (08/09 2340) BP: (124-133)/(47-51) 133/51 (08/09 2340) SpO2:  [94 %-97 %] 97 % (08/09 2340)  Intake/Output from previous day: 08/09 0701 - 08/10 0700 In: 360 [P.O.:360] Out: 1 [Urine:1] Intake/Output this shift: No intake/output data recorded.   Recent Labs  03/27/17 1327 03/28/17 0303 03/29/17 0304  HGB 11.9* 13.2 12.3    Recent Labs  03/28/17 0303 03/29/17 0304  WBC 9.0 8.2  RBC 3.86 3.62*  HCT 38.7 35.5  PLT 300 290    Recent Labs  03/28/17 0303 03/29/17 0304  NA 135 138  K 4.2 3.6  CL 103 104  CO2 25 27  BUN 17 8  CREATININE 0.52 0.60  GLUCOSE 118* 117*  CALCIUM 8.3* 8.6*   No results for input(s): LABPT, INR in the last 72 hours.  EXAM General - Patient is Alert, Appropriate and Oriented Extremity - Neurovascular intact Sensation intact distally Intact pulses distally Dorsiflexion/Plantar flexion intact No cellulitis present Compartment soft Dressing - dressing C/D/I and no drainage, wound vac intact, no drainage Motor Function - intact, moving foot and toes well on exam.   Past Medical History:  Diagnosis Date  . Allergy   . Anaphylaxis   . Anxiety   . Arthritis   . Carpal tunnel syndrome   . Depression   . GERD (gastroesophageal reflux disease)   . Hypertension    patient denies  . Peripheral edema   . Trigger thumb of left hand     Assessment/Plan:   3 Days Post-Op Procedure(s) (LRB): TOTAL KNEE ARTHROPLASTY - LEFT (Left) Active Problems:   Status post total knee  replacement, left  Estimated body mass index is 34.39 kg/m as calculated from the following:   Height as of this encounter: 5' 1.5" (1.562 m).   Weight as of this encounter: 83.9 kg (185 lb). Advance diet Up with therapy  Needs BM before discharge today Labs stable Encouraged incentive spirometer CM to assist with discharge, likely home with HHPT Friday Connector Provena to the wound VAC tubing. The wound VAC will remain in place.  DVT Prophylaxis - Lovenox, Foot Pumps and TED hose Weight-Bearing as tolerated to left leg   Dedra Skeensodd Daissy Yerian, PA-C [facility] 03/30/2017, 6:34 AM

## 2017-03-30 NOTE — Discharge Instructions (Signed)

## 2017-03-30 NOTE — Progress Notes (Signed)
Physical Therapy Treatment Patient Details Name: Emily Watson MRN: [id number] DOB: 03/17/1946 Today's Date: 03/30/2017    History of Present Illness 71 y/o female s/p L TKA 8/7.  PMH includes OA, anxiety/depression, GERD, HTN, and L RTC repair.    PT Comments    Pt initially c/o excessive pain, but overall did well with a lot of cuing and encouragement.  She continues to be stiff in knee, but achieved >80 degrees of flexion (still lacking a few degrees of extension) and with continued reps did better with strength and ROM.  Pt also slow and fatigued with ambulation, but was able to circumambulate the nurses' station with heavy cuing and encouragement.     Follow Up Recommendations  SNF     Equipment Recommendations  Rolling walker with 5" wheels;3in1 (PT)    Recommendations for Other Services       Precautions / Restrictions Precautions Precautions: Fall Restrictions LLE Weight Bearing: Weight bearing as tolerated    Mobility  Bed Mobility Overal bed mobility: Needs Assistance Bed Mobility: Supine to Sit     Supine to sit: Min assist     General bed mobility comments: pt initially having a lot of pain with LE movement, used rails, light PT assist  Transfers Overall transfer level: Needs assistance Equipment used: Rolling walker (2 wheeled) Transfers: Sit to/from Stand Sit to Stand: Min assist         General transfer comment: Pt was able to rise to standing w/o excessive assist, showed good effort  Ambulation/Gait Ambulation/Gait assistance: Min guard Ambulation Distance (Feet): 200 Feet Assistive device: Rolling walker (2 wheeled) (KI)       General Gait Details: Pt continues to need a lot of encouragement and cuing with ambulation but was able to do far better than any previous effort.  She was initialy doing short/choppy L leading step-to cadence.  With cuing pt is abe to ambulate better with increased time.  Did need a few standing rest breaks.      Stairs            Wheelchair Mobility    Modified Rankin (Stroke Patients Only)       Balance Overall balance assessment: Needs assistance   Sitting balance-Leahy Scale: Good       Standing balance-Leahy Scale: Fair                              Cognition Arousal/Alertness: Awake/alert Behavior During Therapy: WFL for tasks assessed/performed Overall Cognitive Status: Within Functional Limits for tasks assessed                                        Exercises Total Joint Exercises Ankle Circles/Pumps: Strengthening;10 reps Quad Sets: Strengthening;15 reps Gluteal Sets: Strengthening;15 reps Short Arc Quad: Strengthening;10 reps Heel Slides: AAROM;10 reps Hip ABduction/ADduction: AROM;Strengthening;10 reps Knee Flexion: PROM;5 reps Goniometric ROM: 84    General Comments        Pertinent Vitals/Pain Pain Assessment: 0-10 Pain Score: 8  (after warming up, pain did seem to go down )    Home Living                      Prior Function            PT Goals (current goals can now be found in the care plan  section) Progress towards PT goals: Progressing toward goals    Frequency    BID      PT Plan Discharge plan needs to be updated    Co-evaluation              AM-PAC PT "6 Clicks" Daily Activity  Outcome Measure  Difficulty turning over in bed (including adjusting bedclothes, sheets and blankets)?: Total Difficulty moving from lying on back to sitting on the side of the bed? : Total Difficulty sitting down on and standing up from a chair with arms (e.g., wheelchair, bedside commode, etc,.)?: Total Help needed moving to and from a bed to chair (including a wheelchair)?: A Little Help needed walking in hospital room?: A Little Help needed climbing 3-5 steps with a railing? : A Lot 6 Click Score: 11    End of Session Equipment Utilized During Treatment: Gait belt Activity Tolerance: Patient  limited by pain;Patient limited by fatigue Patient left: in chair;with chair alarm set;with call bell/phone within reach;with family/visitor present   PT Visit Diagnosis: Unsteadiness on feet (R26.81);Muscle weakness (generalized) (M62.81);Pain     Time: 1030-1100 PT Time Calculation (min) (ACUTE ONLY): 30 min  Charges:  $Gait Training: 8-22 mins $Therapeutic Exercise: 8-22 mins                    G Codes:       Malachi ProGalen R Ysabel Cowgill, DPT 03/30/2017, 1:29 PM

## 2017-04-02 DIAGNOSIS — F39 Unspecified mood [affective] disorder: Secondary | ICD-10-CM | POA: Diagnosis not present

## 2017-04-02 DIAGNOSIS — K219 Gastro-esophageal reflux disease without esophagitis: Secondary | ICD-10-CM | POA: Diagnosis not present

## 2017-04-02 DIAGNOSIS — M179 Osteoarthritis of knee, unspecified: Secondary | ICD-10-CM | POA: Diagnosis not present

## 2017-04-18 DIAGNOSIS — E7800 Pure hypercholesterolemia, unspecified: Secondary | ICD-10-CM | POA: Diagnosis not present

## 2017-04-18 DIAGNOSIS — M1990 Unspecified osteoarthritis, unspecified site: Secondary | ICD-10-CM | POA: Diagnosis not present

## 2017-04-18 DIAGNOSIS — Z96652 Presence of left artificial knee joint: Secondary | ICD-10-CM | POA: Diagnosis not present

## 2017-04-18 DIAGNOSIS — Z7982 Long term (current) use of aspirin: Secondary | ICD-10-CM | POA: Diagnosis not present

## 2017-04-18 DIAGNOSIS — K219 Gastro-esophageal reflux disease without esophagitis: Secondary | ICD-10-CM | POA: Diagnosis not present

## 2017-04-18 DIAGNOSIS — F419 Anxiety disorder, unspecified: Secondary | ICD-10-CM | POA: Diagnosis not present

## 2017-04-18 DIAGNOSIS — Z471 Aftercare following joint replacement surgery: Secondary | ICD-10-CM | POA: Diagnosis not present

## 2017-04-18 DIAGNOSIS — I10 Essential (primary) hypertension: Secondary | ICD-10-CM | POA: Diagnosis not present

## 2017-04-18 DIAGNOSIS — F329 Major depressive disorder, single episode, unspecified: Secondary | ICD-10-CM | POA: Diagnosis not present

## 2017-04-19 DIAGNOSIS — Z471 Aftercare following joint replacement surgery: Secondary | ICD-10-CM | POA: Diagnosis not present

## 2017-04-19 DIAGNOSIS — Z96652 Presence of left artificial knee joint: Secondary | ICD-10-CM | POA: Diagnosis not present

## 2017-04-19 DIAGNOSIS — M1990 Unspecified osteoarthritis, unspecified site: Secondary | ICD-10-CM | POA: Diagnosis not present

## 2017-04-19 DIAGNOSIS — I10 Essential (primary) hypertension: Secondary | ICD-10-CM | POA: Diagnosis not present

## 2017-04-19 DIAGNOSIS — F419 Anxiety disorder, unspecified: Secondary | ICD-10-CM | POA: Diagnosis not present

## 2017-04-19 DIAGNOSIS — F329 Major depressive disorder, single episode, unspecified: Secondary | ICD-10-CM | POA: Diagnosis not present

## 2017-04-20 DIAGNOSIS — Z471 Aftercare following joint replacement surgery: Secondary | ICD-10-CM | POA: Diagnosis not present

## 2017-04-20 DIAGNOSIS — F329 Major depressive disorder, single episode, unspecified: Secondary | ICD-10-CM | POA: Diagnosis not present

## 2017-04-20 DIAGNOSIS — I10 Essential (primary) hypertension: Secondary | ICD-10-CM | POA: Diagnosis not present

## 2017-04-20 DIAGNOSIS — M1990 Unspecified osteoarthritis, unspecified site: Secondary | ICD-10-CM | POA: Diagnosis not present

## 2017-04-20 DIAGNOSIS — Z96652 Presence of left artificial knee joint: Secondary | ICD-10-CM | POA: Diagnosis not present

## 2017-04-20 DIAGNOSIS — F419 Anxiety disorder, unspecified: Secondary | ICD-10-CM | POA: Diagnosis not present

## 2017-04-23 DIAGNOSIS — Z471 Aftercare following joint replacement surgery: Secondary | ICD-10-CM | POA: Diagnosis not present

## 2017-04-23 DIAGNOSIS — F329 Major depressive disorder, single episode, unspecified: Secondary | ICD-10-CM | POA: Diagnosis not present

## 2017-04-23 DIAGNOSIS — M1990 Unspecified osteoarthritis, unspecified site: Secondary | ICD-10-CM | POA: Diagnosis not present

## 2017-04-23 DIAGNOSIS — I10 Essential (primary) hypertension: Secondary | ICD-10-CM | POA: Diagnosis not present

## 2017-04-23 DIAGNOSIS — F419 Anxiety disorder, unspecified: Secondary | ICD-10-CM | POA: Diagnosis not present

## 2017-04-23 DIAGNOSIS — Z96652 Presence of left artificial knee joint: Secondary | ICD-10-CM | POA: Diagnosis not present

## 2017-04-25 DIAGNOSIS — I10 Essential (primary) hypertension: Secondary | ICD-10-CM | POA: Diagnosis not present

## 2017-04-25 DIAGNOSIS — F419 Anxiety disorder, unspecified: Secondary | ICD-10-CM | POA: Diagnosis not present

## 2017-04-25 DIAGNOSIS — M1990 Unspecified osteoarthritis, unspecified site: Secondary | ICD-10-CM | POA: Diagnosis not present

## 2017-04-25 DIAGNOSIS — Z471 Aftercare following joint replacement surgery: Secondary | ICD-10-CM | POA: Diagnosis not present

## 2017-04-25 DIAGNOSIS — Z96652 Presence of left artificial knee joint: Secondary | ICD-10-CM | POA: Diagnosis not present

## 2017-04-25 DIAGNOSIS — F329 Major depressive disorder, single episode, unspecified: Secondary | ICD-10-CM | POA: Diagnosis not present

## 2017-04-27 DIAGNOSIS — Z471 Aftercare following joint replacement surgery: Secondary | ICD-10-CM | POA: Diagnosis not present

## 2017-04-27 DIAGNOSIS — F329 Major depressive disorder, single episode, unspecified: Secondary | ICD-10-CM | POA: Diagnosis not present

## 2017-04-27 DIAGNOSIS — F419 Anxiety disorder, unspecified: Secondary | ICD-10-CM | POA: Diagnosis not present

## 2017-04-27 DIAGNOSIS — M1990 Unspecified osteoarthritis, unspecified site: Secondary | ICD-10-CM | POA: Diagnosis not present

## 2017-04-27 DIAGNOSIS — Z96652 Presence of left artificial knee joint: Secondary | ICD-10-CM | POA: Diagnosis not present

## 2017-04-27 DIAGNOSIS — I10 Essential (primary) hypertension: Secondary | ICD-10-CM | POA: Diagnosis not present

## 2017-04-30 ENCOUNTER — Ambulatory Visit: Payer: Medicare Other | Admitting: Unknown Physician Specialty

## 2017-04-30 DIAGNOSIS — I10 Essential (primary) hypertension: Secondary | ICD-10-CM | POA: Diagnosis not present

## 2017-04-30 DIAGNOSIS — M1990 Unspecified osteoarthritis, unspecified site: Secondary | ICD-10-CM | POA: Diagnosis not present

## 2017-04-30 DIAGNOSIS — Z471 Aftercare following joint replacement surgery: Secondary | ICD-10-CM | POA: Diagnosis not present

## 2017-04-30 DIAGNOSIS — F329 Major depressive disorder, single episode, unspecified: Secondary | ICD-10-CM | POA: Diagnosis not present

## 2017-04-30 DIAGNOSIS — F419 Anxiety disorder, unspecified: Secondary | ICD-10-CM | POA: Diagnosis not present

## 2017-04-30 DIAGNOSIS — Z96652 Presence of left artificial knee joint: Secondary | ICD-10-CM | POA: Diagnosis not present

## 2017-05-02 DIAGNOSIS — Z96652 Presence of left artificial knee joint: Secondary | ICD-10-CM | POA: Diagnosis not present

## 2017-05-02 DIAGNOSIS — Z471 Aftercare following joint replacement surgery: Secondary | ICD-10-CM | POA: Diagnosis not present

## 2017-05-02 DIAGNOSIS — F419 Anxiety disorder, unspecified: Secondary | ICD-10-CM | POA: Diagnosis not present

## 2017-05-02 DIAGNOSIS — F329 Major depressive disorder, single episode, unspecified: Secondary | ICD-10-CM | POA: Diagnosis not present

## 2017-05-02 DIAGNOSIS — I10 Essential (primary) hypertension: Secondary | ICD-10-CM | POA: Diagnosis not present

## 2017-05-02 DIAGNOSIS — M1990 Unspecified osteoarthritis, unspecified site: Secondary | ICD-10-CM | POA: Diagnosis not present

## 2017-05-03 ENCOUNTER — Telehealth: Payer: Self-pay | Admitting: Unknown Physician Specialty

## 2017-05-03 NOTE — Telephone Encounter (Signed)
Emily Watson with [facility] called needs to speak to someone regarding the orders for this patients OT/PT. They do not feel she needs OT.  Emily Watson [phone]  Thank You

## 2017-05-03 NOTE — Telephone Encounter (Signed)
Called and spoke with Pankti. She stated that the paid was recently discharged home with PT and OT orders after a knee surgery. Pankti states that they went out to do an evaluation and believe that the patient does not need OT at this time because the patient seemed to being doing well with her daily routines. Pankti states that they just wanted the patient's provider to know that they would not be doing OT at this time.

## 2017-05-04 DIAGNOSIS — F329 Major depressive disorder, single episode, unspecified: Secondary | ICD-10-CM | POA: Diagnosis not present

## 2017-05-04 DIAGNOSIS — Z96652 Presence of left artificial knee joint: Secondary | ICD-10-CM | POA: Diagnosis not present

## 2017-05-04 DIAGNOSIS — M1990 Unspecified osteoarthritis, unspecified site: Secondary | ICD-10-CM | POA: Diagnosis not present

## 2017-05-04 DIAGNOSIS — F419 Anxiety disorder, unspecified: Secondary | ICD-10-CM | POA: Diagnosis not present

## 2017-05-04 DIAGNOSIS — I10 Essential (primary) hypertension: Secondary | ICD-10-CM | POA: Diagnosis not present

## 2017-05-04 DIAGNOSIS — Z471 Aftercare following joint replacement surgery: Secondary | ICD-10-CM | POA: Diagnosis not present

## 2017-05-07 DIAGNOSIS — F419 Anxiety disorder, unspecified: Secondary | ICD-10-CM | POA: Diagnosis not present

## 2017-05-07 DIAGNOSIS — Z96652 Presence of left artificial knee joint: Secondary | ICD-10-CM | POA: Diagnosis not present

## 2017-05-07 DIAGNOSIS — F329 Major depressive disorder, single episode, unspecified: Secondary | ICD-10-CM | POA: Diagnosis not present

## 2017-05-07 DIAGNOSIS — I10 Essential (primary) hypertension: Secondary | ICD-10-CM | POA: Diagnosis not present

## 2017-05-07 DIAGNOSIS — M1990 Unspecified osteoarthritis, unspecified site: Secondary | ICD-10-CM | POA: Diagnosis not present

## 2017-05-07 DIAGNOSIS — Z471 Aftercare following joint replacement surgery: Secondary | ICD-10-CM | POA: Diagnosis not present

## 2017-05-09 ENCOUNTER — Other Ambulatory Visit: Payer: Self-pay | Admitting: Unknown Physician Specialty

## 2017-05-09 DIAGNOSIS — Z96652 Presence of left artificial knee joint: Secondary | ICD-10-CM | POA: Diagnosis not present

## 2017-05-09 MED ORDER — DULOXETINE HCL 60 MG PO CPEP: 60.0000 mg | ORAL_CAPSULE | Freq: Every day | ORAL | 3 refills | Status: DC

## 2017-05-09 NOTE — Telephone Encounter (Signed)
Routing to provider. Patient has appointment 05/18/17.

## 2017-05-09 NOTE — Telephone Encounter (Signed)
Patient called to get Emily Watson to send in renewal for her script Duloxetine to [phone].  If any questions she can be reached at [phone]  Thank you

## 2017-05-11 DIAGNOSIS — F329 Major depressive disorder, single episode, unspecified: Secondary | ICD-10-CM | POA: Diagnosis not present

## 2017-05-11 DIAGNOSIS — F419 Anxiety disorder, unspecified: Secondary | ICD-10-CM | POA: Diagnosis not present

## 2017-05-11 DIAGNOSIS — I10 Essential (primary) hypertension: Secondary | ICD-10-CM | POA: Diagnosis not present

## 2017-05-11 DIAGNOSIS — Z471 Aftercare following joint replacement surgery: Secondary | ICD-10-CM | POA: Diagnosis not present

## 2017-05-11 DIAGNOSIS — M1990 Unspecified osteoarthritis, unspecified site: Secondary | ICD-10-CM | POA: Diagnosis not present

## 2017-05-11 DIAGNOSIS — Z96652 Presence of left artificial knee joint: Secondary | ICD-10-CM | POA: Diagnosis not present

## 2017-05-18 ENCOUNTER — Encounter: Payer: Self-pay | Admitting: Unknown Physician Specialty

## 2017-05-18 ENCOUNTER — Ambulatory Visit (INDEPENDENT_AMBULATORY_CARE_PROVIDER_SITE_OTHER): Payer: Medicare Other | Admitting: Unknown Physician Specialty

## 2017-05-18 VITALS — BP 124/75 | HR 91 | Temp 98.5°F | Ht 61.3 in | Wt 186.1 lb

## 2017-05-18 DIAGNOSIS — Z23 Encounter for immunization: Secondary | ICD-10-CM | POA: Diagnosis not present

## 2017-05-18 DIAGNOSIS — F321 Major depressive disorder, single episode, moderate: Secondary | ICD-10-CM | POA: Diagnosis not present

## 2017-05-18 NOTE — Progress Notes (Signed)
   BP 124/75   Pulse 91   Temp 98.5 F (36.9 C)   Ht 5' 1.3" (1.557 m)   Wt 186 lb 1.6 oz (84.4 kg)   LMP 02/01/1991 (Approximate)   SpO2 93%   BMI 34.82 kg/m    Subjective:    Patient ID: Emily Watson, female    DOB: [birth year], 71 y.o.   MRN: [id number]  HPI: Emily Watson is a 71 y.o. female  Chief Complaint  Patient presents with  . Depression   Depression Taking Cymbalta 60 mg.  Cranky now and then.  Doing well but sleep difficult due to knee pain.   Depression screen [facility] 2/9 05/18/2017 08/18/2016 01/10/2016 08/03/2015  Decreased Interest 0 0 0 0  Down, Depressed, Hopeless 0 0 0 0  PHQ - 2 Score 0 0 0 0  Altered sleeping 1 1 - -  Tired, decreased energy 0 1 - -  Change in appetite 0 0 - -  Feeling bad or failure about yourself  0 0 - -  Trouble concentrating 0 0 - -  Moving slowly or fidgety/restless 0 0 - -  Suicidal thoughts 0 0 - -  PHQ-9 Score 1 2 - -   Knee pain S/P left TKA by Dr Rosita Kea on 03/27/17.  Having PT come to the house.  Starting outpatient PT  Relevant past medical, surgical, family and social history reviewed and updated as indicated. Interim medical history since our last visit reviewed. Allergies and medications reviewed and updated.  Review of Systems  Per HPI unless specifically indicated above     Objective:    BP 124/75   Pulse 91   Temp 98.5 F (36.9 C)   Ht 5' 1.3" (1.557 m)   Wt 186 lb 1.6 oz (84.4 kg)   LMP 02/01/1991 (Approximate)   SpO2 93%   BMI 34.82 kg/m   Wt Readings from Last 3 Encounters:  05/18/17 186 lb 1.6 oz (84.4 kg)  03/27/17 185 lb (83.9 kg)  03/14/17 185 lb (83.9 kg)    Physical Exam  Constitutional: She is oriented to person, place, and time. She appears well-developed and well-nourished. No distress.  HENT:  Head: Normocephalic and atraumatic.  Eyes: Conjunctivae and lids are normal. Right eye exhibits no discharge. Left eye exhibits no discharge. No scleral icterus.  Neck: Normal range of motion. Neck  supple. No JVD present. Carotid bruit is not present.  Cardiovascular: Normal rate, regular rhythm and normal heart sounds.   Pulmonary/Chest: Effort normal and breath sounds normal.  Abdominal: Normal appearance. There is no splenomegaly or hepatomegaly.  Musculoskeletal: Normal range of motion.  Neurological: She is alert and oriented to person, place, and time.  Skin: Skin is warm, dry and intact. No rash noted. No pallor.  Psychiatric: She has a normal mood and affect. Her behavior is normal. Judgment and thought content normal.      Assessment & Plan:   Problem List Items Addressed This Visit      Unprioritized   Depression, major, single episode, moderate (HCC)    Doing well with Cymbalta       Other Visit Diagnoses    Need for influenza vaccination    -  Primary   Relevant Orders   Flu vaccine HIGH DOSE PF       Follow up plan: Return for physical.

## 2017-05-18 NOTE — Assessment & Plan Note (Signed)
Doing well with Cymbalta

## 2017-05-18 NOTE — Patient Instructions (Addendum)

## 2017-05-21 DIAGNOSIS — F321 Major depressive disorder, single episode, moderate: Secondary | ICD-10-CM | POA: Diagnosis not present

## 2017-05-21 DIAGNOSIS — Z23 Encounter for immunization: Secondary | ICD-10-CM | POA: Diagnosis not present

## 2017-05-21 DIAGNOSIS — M25562 Pain in left knee: Secondary | ICD-10-CM | POA: Diagnosis not present

## 2017-05-21 DIAGNOSIS — R262 Difficulty in walking, not elsewhere classified: Secondary | ICD-10-CM | POA: Diagnosis not present

## 2017-05-23 DIAGNOSIS — R262 Difficulty in walking, not elsewhere classified: Secondary | ICD-10-CM | POA: Diagnosis not present

## 2017-05-23 DIAGNOSIS — M25562 Pain in left knee: Secondary | ICD-10-CM | POA: Diagnosis not present

## 2017-06-06 DIAGNOSIS — R262 Difficulty in walking, not elsewhere classified: Secondary | ICD-10-CM | POA: Diagnosis not present

## 2017-06-06 DIAGNOSIS — M25562 Pain in left knee: Secondary | ICD-10-CM | POA: Diagnosis not present

## 2017-06-13 DIAGNOSIS — R262 Difficulty in walking, not elsewhere classified: Secondary | ICD-10-CM | POA: Diagnosis not present

## 2017-06-13 DIAGNOSIS — M25562 Pain in left knee: Secondary | ICD-10-CM | POA: Diagnosis not present

## 2017-06-18 ENCOUNTER — Ambulatory Visit (INDEPENDENT_AMBULATORY_CARE_PROVIDER_SITE_OTHER): Payer: Medicare Other

## 2017-06-18 VITALS — BP 137/84 | HR 83 | Temp 98.3°F | Resp 16 | Ht 62.0 in | Wt 185.9 lb

## 2017-06-18 DIAGNOSIS — Z0000 Encounter for general adult medical examination without abnormal findings: Secondary | ICD-10-CM

## 2017-06-18 NOTE — Patient Instructions (Addendum)
[name] , Thank you for taking time to come for your Medicare Wellness Visit. I appreciate your ongoing commitment to your health goals. Please review the following plan we discussed and let me know if I can assist you in the future.   Screening recommendations/referrals: Colonoscopy: due, declined  Mammogram: completed 02/12/2016 Bone Density: due now,declined Recommended yearly ophthalmology/optometry visit for glaucoma screening and checkup Recommended yearly dental visit for hygiene and checkup  Vaccinations: Influenza vaccine: up to date Pneumococcal vaccine: up to date Tdap vaccine: due, check with your insurance company for coverage  Shingles vaccine: due, check with your insurance company for coverage   Advanced directives: Advance directive discussed with you today. I have provided a copy for you to complete at home and have notarized. Once this is complete please bring a copy in to our office so we can scan it into your chart.  Conditions/risks identified: none  Next appointment: Follow up in one year for your annual wellness exam.    Preventive Care 65 Years and Older, Female Preventive care refers to lifestyle choices and visits with your health care provider that can promote health and wellness. What does preventive care include?  A yearly physical exam. This is also called an annual well check.  Dental exams once or twice a year.  Routine eye exams. Ask your health care provider how often you should have your eyes checked.  Personal lifestyle choices, including:  Daily care of your teeth and gums.  Regular physical activity.  Eating a healthy diet.  Avoiding tobacco and drug use.  Limiting alcohol use.  Practicing safe sex.  Taking low-dose aspirin every day.  Taking vitamin and mineral supplements as recommended by your health care provider. What happens during an annual well check? The services and screenings done by your health care provider  during your annual well check will depend on your age, overall health, lifestyle risk factors, and family history of disease. Counseling  Your health care provider may ask you questions about your:  Alcohol use.  Tobacco use.  Drug use.  Emotional well-being.  Home and relationship well-being.  Sexual activity.  Eating habits.  History of falls.  Memory and ability to understand (cognition).  Work and work Astronomerenvironment.  Reproductive health. Screening  You may have the following tests or measurements:  Height, weight, and BMI.  Blood pressure.  Lipid and cholesterol levels. These may be checked every 5 years, or more frequently if you are over 69 years old.  Skin check.  Lung cancer screening. You may have this screening every year starting at age 69 if you have a 30-pack-year history of smoking and currently smoke or have quit within the past 15 years.  Fecal occult blood test (FOBT) of the stool. You may have this test every year starting at age 69.  Flexible sigmoidoscopy or colonoscopy. You may have a sigmoidoscopy every 5 years or a colonoscopy every 10 years starting at age 69.  Hepatitis C blood test.  Hepatitis B blood test.  Sexually transmitted disease (STD) testing.  Diabetes screening. This is done by checking your blood sugar (glucose) after you have not eaten for a while (fasting). You may have this done every 1-3 years.  Bone density scan. This is done to screen for osteoporosis. You may have this done starting at age 69.  Mammogram. This may be done every 1-2 years. Talk to your health care provider about how often you should have regular mammograms. Talk with your health  care provider about your test results, treatment options, and if necessary, the need for more tests. Vaccines  Your health care provider may recommend certain vaccines, such as:  Influenza vaccine. This is recommended every year.  Tetanus, diphtheria, and acellular pertussis  (Tdap, Td) vaccine. You may need a Td booster every 10 years.  Zoster vaccine. You may need this after age 42.  Pneumococcal 13-valent conjugate (PCV13) vaccine. One dose is recommended after age 47.  Pneumococcal polysaccharide (PPSV23) vaccine. One dose is recommended after age 20. Talk to your health care provider about which screenings and vaccines you need and how often you need them. This information is not intended to replace advice given to you by your health care provider. Make sure you discuss any questions you have with your health care provider. Document Released: 09/03/2015 Document Revised: 04/26/2016 Document Reviewed: 06/08/2015 Elsevier Interactive Patient Education  2017 [place] Prevention in the Home Falls can cause injuries. They can happen to people of all ages. There are many things you can do to make your home safe and to help prevent falls. What can I do on the outside of my home?  Regularly fix the edges of walkways and driveways and fix any cracks.  Remove anything that might make you trip as you walk through a door, such as a raised step or threshold.  Trim any bushes or trees on the path to your home.  Use bright outdoor lighting.  Clear any walking paths of anything that might make someone trip, such as rocks or tools.  Regularly check to see if handrails are loose or broken. Make sure that both sides of any steps have handrails.  Any raised decks and porches should have guardrails on the edges.  Have any leaves, snow, or ice cleared regularly.  Use sand or salt on walking paths during winter.  Clean up any spills in your garage right away. This includes oil or grease spills. What can I do in the bathroom?  Use night lights.  Install grab bars by the toilet and in the tub and shower. Do not use towel bars as grab bars.  Use non-skid mats or decals in the tub or shower.  If you need to sit down in the shower, use a plastic, non-slip  stool.  Keep the floor dry. Clean up any water that spills on the floor as soon as it happens.  Remove soap buildup in the tub or shower regularly.  Attach bath mats securely with double-sided non-slip rug tape.  Do not have throw rugs and other things on the floor that can make you trip. What can I do in the bedroom?  Use night lights.  Make sure that you have a light by your bed that is easy to reach.  Do not use any sheets or blankets that are too big for your bed. They should not hang down onto the floor.  Have a firm chair that has side arms. You can use this for support while you get dressed.  Do not have throw rugs and other things on the floor that can make you trip. What can I do in the kitchen?  Clean up any spills right away.  Avoid walking on wet floors.  Keep items that you use a lot in easy-to-reach places.  If you need to reach something above you, use a strong step stool that has a grab bar.  Keep electrical cords out of the way.  Do not use floor  polish or wax that makes floors slippery. If you must use wax, use non-skid floor wax.  Do not have throw rugs and other things on the floor that can make you trip. What can I do with my stairs?  Do not leave any items on the stairs.  Make sure that there are handrails on both sides of the stairs and use them. Fix handrails that are broken or loose. Make sure that handrails are as long as the stairways.  Check any carpeting to make sure that it is firmly attached to the stairs. Fix any carpet that is loose or worn.  Avoid having throw rugs at the top or bottom of the stairs. If you do have throw rugs, attach them to the floor with carpet tape.  Make sure that you have a light switch at the top of the stairs and the bottom of the stairs. If you do not have them, ask someone to add them for you. What else can I do to help prevent falls?  Wear shoes that:  Do not have high heels.  Have rubber bottoms.  Are  comfortable and fit you well.  Are closed at the toe. Do not wear sandals.  If you use a stepladder:  Make sure that it is fully opened. Do not climb a closed stepladder.  Make sure that both sides of the stepladder are locked into place.  Ask someone to hold it for you, if possible.  Clearly mark and make sure that you can see:  Any grab bars or handrails.  First and last steps.  Where the edge of each step is.  Use tools that help you move around (mobility aids) if they are needed. These include:  Canes.  Walkers.  Scooters.  Crutches.  Turn on the lights when you go into a dark area. Replace any light bulbs as soon as they burn out.  Set up your furniture so you have a clear path. Avoid moving your furniture around.  If any of your floors are uneven, fix them.  If there are any pets around you, be aware of where they are.  Review your medicines with your doctor. Some medicines can make you feel dizzy. This can increase your chance of falling. Ask your doctor what other things that you can do to help prevent falls. This information is not intended to replace advice given to you by your health care provider. Make sure you discuss any questions you have with your health care provider. Document Released: 06/03/2009 Document Revised: 01/13/2016 Document Reviewed: 09/11/2014 Elsevier Interactive Patient Education  2017 [employer].

## 2017-06-18 NOTE — Progress Notes (Signed)
Subjective:   Emily Watson is a 71 y.o. female who presents for an Initial Medicare Annual Wellness Visit.  Review of Systems    Cardiac Risk Factors include: hypertension;obesity (BMI >30kg/m2);advanced age (>6men, >10 women);dyslipidemia     Objective:    Today's Vitals   06/18/17 0808  BP: 137/84  Pulse: 83  Resp: 16  Temp: 98.3 F (36.8 C)  TempSrc: Oral  Weight: 185 lb 14.4 oz (84.3 kg)  Height: 5\' 2"  (1.575 m)   Body mass index is 34 kg/m.   Current Medications (verified) Outpatient Encounter Prescriptions as of 06/18/2017  Medication Sig  . aspirin (ASPIRIN EC) 81 MG EC tablet Take 81 mg by mouth daily. Swallow whole.  . DULoxetine (CYMBALTA) 60 MG capsule Take 1 capsule (60 mg total) by mouth daily.  . Multiple Vitamin (MULTIVITAMIN) tablet Take 1 tablet by mouth daily.  Marland Kitchen omeprazole (PRILOSEC OTC) 20 MG tablet Take 1 tablet (20 mg total) by mouth daily.  Marland Kitchen enoxaparin (LOVENOX) 40 MG/0.4ML injection Inject 0.4 mLs (40 mg total) into the skin daily.   No facility-administered encounter medications on file as of 06/18/2017.     Allergies (verified) Codeine; Chlorpheniramine-phenylephrine; Gabapentin; Other; Peppermint oil; and Triprolidine-pseudoephedrine   History: Past Medical History:  Diagnosis Date  . Allergy   . Anaphylaxis   . Anxiety   . Arthritis   . Carpal tunnel syndrome   . Depression   . GERD (gastroesophageal reflux disease)   . Hypertension    patient denies  . Peripheral edema   . Trigger thumb of left hand    Past Surgical History:  Procedure Laterality Date  . CARPAL TUNNEL RELEASE    . HERNIA REPAIR  04/2009  . ROTATOR CUFF REPAIR Left   . TOTAL KNEE ARTHROPLASTY Left 03/27/2017   Procedure: TOTAL KNEE ARTHROPLASTY - LEFT;  Surgeon: Kennedy Bucker, MD;  Location: [facility] ORS;  Service: Orthopedics;  Laterality: Left;   Family History  Problem Relation Age of Onset  . Kidney disease Mother   . Cancer Mother   . Kidney disease  Father   . Cancer Father   . Cancer Sister        bone  . Hypertension Sister   . Osteoporosis Sister   . Alzheimer's disease Sister   . Hypertension Brother    Social History   Occupational History  . Not on file.   Social History Main Topics  . Smoking status: Former Smoker    Quit date: 03/14/1997  . Smokeless tobacco: Never Used  . Alcohol use No  . Drug use: No  . Sexual activity: Not on file    Tobacco Counseling Counseling given: Not Answered   Activities of Daily Living In your present state of health, do you have any difficulty performing the following activities: 06/18/2017 03/27/2017  Hearing? Y Y  Comment ringing in right ear occasionally  -  Vision? N N  Difficulty concentrating or making decisions? N N  Walking or climbing stairs? Y Y  Comment due to knee surgery -  Dressing or bathing? N N  Doing errands, shopping? N N  Preparing Food and eating ? N -  Using the Toilet? N -  In the past six months, have you accidently leaked urine? N -  Do you have problems with loss of bowel control? N -  Managing your Medications? N -  Managing your Finances? N -  Housekeeping or managing your Housekeeping? N -  Some recent data might be  hidden    Immunizations and Health Maintenance Immunization History  Administered Date(s) Administered  . Influenza, High Dose Seasonal PF 06/08/2016, 05/21/2017  . Influenza,inj,Quad PF,6+ Mos 06/04/2015  . Pneumococcal Conjugate-13 07/20/2014  . Pneumococcal Polysaccharide-23 08/03/2015  . Td 08/21/2005   There are no preventive care reminders to display for this patient.  Patient Care Team: Gabriel CirriWicker, Cheryl, NP as PCP - General (Nurse Practitioner) Kennedy BuckerMenz, Michael, MD as Consulting Physician (Orthopedic Surgery)  Indicate any recent Medical Services you may have received from other than [facility] providers in the past year (date may be approximate).     Assessment:   This is a routine wellness examination for Emily Watson.    Hearing/Vision screen Vision Screening Comments: [facility]  Dietary issues and exercise activities discussed: Current Exercise Habits: Home exercise routine, Type of exercise: strength training/weights (Physical Therapy 1x a week), Time (Minutes): 60, Frequency (Times/Week): 1, Weekly Exercise (Minutes/Week): 60, Intensity: Mild, Exercise limited by: None identified  Goals    None     Depression Screen PHQ 2/9 Scores 06/18/2017 05/18/2017 08/18/2016 01/10/2016 08/03/2015  PHQ - 2 Score 0 0 0 0 0  PHQ- 9 Score 0 1 2 - -    Fall Risk Fall Risk  06/18/2017 08/18/2016  Falls in the past year? No No    Cognitive Function:     6CIT Screen 06/18/2017  What Year? 0 points  What month? 0 points  What time? 0 points  Count back from 20 0 points  Months in reverse 0 points  Repeat phrase 4 points  Total Score 4    Screening Tests Health Maintenance  Topic Date Due  . COLONOSCOPY  08/18/2017 (Originally 05/07/1998)  . DEXA SCAN  06/18/2018 (Originally 05/07/2013)  . TETANUS/TDAP  06/18/2018 (Originally 08/22/2015)  . Hepatitis C Screening  06/18/2018 (Originally Sep 05, 1945)  . MAMMOGRAM  02/11/2018  . INFLUENZA VACCINE  Completed  . PNA vac Low Risk Adult  Completed      Plan:    I have personally reviewed and addressed the Medicare Annual Wellness questionnaire and have noted the following in the patient's chart:  A. Medical and social history B. Use of alcohol, tobacco or illicit drugs  C. Current medications and supplements D. Functional ability and status E.  Nutritional status F.  Physical activity G. Advance directives H. List of other physicians I.  Hospitalizations, surgeries, and ER visits in previous 12 months J.  Vitals K. Screenings such as hearing and vision if needed, cognitive and depression L. Referrals and appointments   In addition, I have reviewed and discussed with patient certain preventive protocols, quality metrics, and  best practice recommendations. A written personalized care plan for preventive services as well as general preventive health recommendations were provided to patient.   Signed,  Marin Robertsiffany Erron Wengert, LPN Nurse Health Advisor   MD Recommendations: none

## 2017-06-20 DIAGNOSIS — M25562 Pain in left knee: Secondary | ICD-10-CM | POA: Diagnosis not present

## 2017-06-20 DIAGNOSIS — R262 Difficulty in walking, not elsewhere classified: Secondary | ICD-10-CM | POA: Diagnosis not present

## 2017-09-05 ENCOUNTER — Ambulatory Visit (INDEPENDENT_AMBULATORY_CARE_PROVIDER_SITE_OTHER): Payer: Medicare Other | Admitting: Unknown Physician Specialty

## 2017-09-05 ENCOUNTER — Encounter: Payer: Self-pay | Admitting: Unknown Physician Specialty

## 2017-09-05 VITALS — BP 128/78 | HR 73 | Temp 98.0°F | Wt 189.4 lb

## 2017-09-05 DIAGNOSIS — J029 Acute pharyngitis, unspecified: Secondary | ICD-10-CM

## 2017-09-05 DIAGNOSIS — R059 Cough, unspecified: Secondary | ICD-10-CM

## 2017-09-05 DIAGNOSIS — R05 Cough: Secondary | ICD-10-CM | POA: Diagnosis not present

## 2017-09-05 DIAGNOSIS — J069 Acute upper respiratory infection, unspecified: Secondary | ICD-10-CM

## 2017-09-05 MED ORDER — BENZONATATE 100 MG PO CAPS: 100.0000 mg | ORAL_CAPSULE | Freq: Three times a day (TID) | ORAL | 0 refills | Status: DC | PRN

## 2017-09-05 NOTE — Progress Notes (Signed)
BP 128/78   Pulse 73   Temp 98 F (36.7 C) (Oral)   Wt 189 lb 6.4 oz (85.9 kg)   LMP 02/01/1991 (Approximate)   SpO2 98%   BMI 34.64 kg/m    Subjective:    Patient ID: Emily Watson, female    DOB: Dec 24, 1945, 72 y.o.   MRN: [id number]  HPI: Emily Realheresa Kundinger is a 72 y.o. female  Chief Complaint  Patient presents with  . Sore Throat    pt states she has had a sore throat since Friday. States a cough started a few days ago    URI   This is a new problem. The problem has been unchanged. There has been no fever. Associated symptoms include congestion, coughing, rhinorrhea and a sore throat. Pertinent negatives include no abdominal pain, chest pain, diarrhea, dysuria, ear pain, headaches, joint pain, joint swelling, nausea, neck pain, plugged ear sensation, rash, sinus pain, sneezing, swollen glands, vomiting or wheezing. She has tried nothing for the symptoms.    Relevant past medical, surgical, family and social history reviewed and updated as indicated. Interim medical history since our last visit reviewed. Allergies and medications reviewed and updated.  Review of Systems  HENT: Positive for congestion, rhinorrhea and sore throat. Negative for ear pain, sinus pain and sneezing.   Respiratory: Positive for cough. Negative for wheezing.   Cardiovascular: Negative for chest pain.  Gastrointestinal: Negative for abdominal pain, diarrhea, nausea and vomiting.  Genitourinary: Negative for dysuria.  Musculoskeletal: Negative for joint pain and neck pain.  Skin: Negative for rash.  Neurological: Negative for headaches.    Per HPI unless specifically indicated above     Objective:    BP 128/78   Pulse 73   Temp 98 F (36.7 C) (Oral)   Wt 189 lb 6.4 oz (85.9 kg)   LMP 02/01/1991 (Approximate)   SpO2 98%   BMI 34.64 kg/m   Wt Readings from Last 3 Encounters:  09/05/17 189 lb 6.4 oz (85.9 kg)  06/18/17 185 lb 14.4 oz (84.3 kg)  05/18/17 186 lb 1.6 oz (84.4 kg)      Physical Exam  Constitutional: She is oriented to person, place, and time. She appears well-developed and well-nourished. No distress.  HENT:  Head: Normocephalic and atraumatic.  Right Ear: Tympanic membrane and ear canal normal.  Left Ear: Tympanic membrane and ear canal normal.  Nose: Rhinorrhea present. Right sinus exhibits no maxillary sinus tenderness and no frontal sinus tenderness. Left sinus exhibits no maxillary sinus tenderness and no frontal sinus tenderness.  Mouth/Throat: Mucous membranes are normal. Posterior oropharyngeal erythema present.  Eyes: Conjunctivae and lids are normal. Right eye exhibits no discharge. Left eye exhibits no discharge. No scleral icterus.  Cardiovascular: Normal rate and regular rhythm.  Pulmonary/Chest: Effort normal and breath sounds normal. No respiratory distress.  Abdominal: Normal appearance. There is no splenomegaly or hepatomegaly.  Musculoskeletal: Normal range of motion.  Neurological: She is alert and oriented to person, place, and time.  Skin: Skin is intact. No rash noted. No pallor.  Psychiatric: She has a normal mood and affect. Her behavior is normal. Judgment and thought content normal.    Results for orders placed or performed during the hospital encounter of 03/27/17  CBC  Result Value Ref Range   WBC 16.0 (H) 3.6 - 11.0 K/uL   RBC 3.49 (L) 3.80 - 5.20 MIL/uL   Hemoglobin 11.9 (L) 12.0 - 16.0 g/dL   HCT 38.734.2 (L) 56.435.0 - 33.247.0 %  MCV 97.8 80.0 - 100.0 fL   MCH 34.0 26.0 - 34.0 pg   MCHC 34.8 32.0 - 36.0 g/dL   RDW 16.1 09.6 - 04.5 %   Platelets 323 150 - 440 K/uL  Creatinine, serum  Result Value Ref Range   Creatinine, Ser 0.60 0.44 - 1.00 mg/dL   GFR calc non Af Amer >60 >60 mL/min   GFR calc Af Amer >60 >60 mL/min  CBC  Result Value Ref Range   WBC 9.0 3.6 - 11.0 K/uL   RBC 3.86 3.80 - 5.20 MIL/uL   Hemoglobin 13.2 12.0 - 16.0 g/dL   HCT 40.9 81.1 - 91.4 %   MCV 100.3 (H) 80.0 - 100.0 fL   MCH 34.3 (H) 26.0 - 34.0  pg   MCHC 34.2 32.0 - 36.0 g/dL   RDW 78.2 (H) 95.6 - 21.3 %   Platelets 300 150 - 440 K/uL  Basic metabolic panel  Result Value Ref Range   Sodium 135 135 - 145 mmol/L   Potassium 4.2 3.5 - 5.1 mmol/L   Chloride 103 101 - 111 mmol/L   CO2 25 22 - 32 mmol/L   Glucose, Bld 118 (H) 65 - 99 mg/dL   BUN 17 6 - 20 mg/dL   Creatinine, Ser 0.86 0.44 - 1.00 mg/dL   Calcium 8.3 (L) 8.9 - 10.3 mg/dL   GFR calc non Af Amer >60 >60 mL/min   GFR calc Af Amer >60 >60 mL/min   Anion gap 7 5 - 15  CBC  Result Value Ref Range   WBC 8.2 3.6 - 11.0 K/uL   RBC 3.62 (L) 3.80 - 5.20 MIL/uL   Hemoglobin 12.3 12.0 - 16.0 g/dL   HCT 57.8 46.9 - 62.9 %   MCV 98.2 80.0 - 100.0 fL   MCH 34.1 (H) 26.0 - 34.0 pg   MCHC 34.7 32.0 - 36.0 g/dL   RDW 52.8 41.3 - 24.4 %   Platelets 290 150 - 440 K/uL  Basic metabolic panel  Result Value Ref Range   Sodium 138 135 - 145 mmol/L   Potassium 3.6 3.5 - 5.1 mmol/L   Chloride 104 101 - 111 mmol/L   CO2 27 22 - 32 mmol/L   Glucose, Bld 117 (H) 65 - 99 mg/dL   BUN 8 6 - 20 mg/dL   Creatinine, Ser 0.10 0.44 - 1.00 mg/dL   Calcium 8.6 (L) 8.9 - 10.3 mg/dL   GFR calc non Af Amer >60 >60 mL/min   GFR calc Af Amer >60 >60 mL/min   Anion gap 7 5 - 15  ABO/Rh  Result Value Ref Range   ABO/RH(D) B POS       Assessment & Plan:   Problem List Items Addressed This Visit    None    Visit Diagnoses    Sore throat    -  Primary   discussed salt water gargles   Relevant Orders   Rapid Strep Screen (Not at [facility])   Viral upper respiratory tract infection       Saline nasal sprays.  Fluids and rest   Cough       Rx for Tessalon Perles.         Follow up plan: Return if symptoms worsen or fail to improve.

## 2017-09-07 LAB — CULTURE, GROUP A STREP: STREP A CULTURE: NEGATIVE

## 2017-09-07 LAB — RAPID STREP SCREEN (MED CTR MEBANE ONLY): STREP GP A AG, IA W/REFLEX: NEGATIVE

## 2018-02-28 ENCOUNTER — Ambulatory Visit (INDEPENDENT_AMBULATORY_CARE_PROVIDER_SITE_OTHER): Payer: Medicare Other | Admitting: Family Medicine

## 2018-02-28 DIAGNOSIS — L03116 Cellulitis of left lower limb: Secondary | ICD-10-CM | POA: Diagnosis not present

## 2018-02-28 DIAGNOSIS — W57XXXA Bitten or stung by nonvenomous insect and other nonvenomous arthropods, initial encounter: Secondary | ICD-10-CM | POA: Diagnosis not present

## 2018-02-28 MED ORDER — SULFAMETHOXAZOLE-TRIMETHOPRIM 800-160 MG PO TABS: 1.0000 | ORAL_TABLET | Freq: Two times a day (BID) | ORAL | 0 refills | Status: DC

## 2018-02-28 NOTE — Patient Instructions (Signed)
Start bactrim, your antibiotic which will be twice daily for a week Clean with mild soap and water at least once daily, apply neosporin at least twice daily

## 2018-02-28 NOTE — Progress Notes (Signed)
   LMP 02/01/1991 (Approximate)    Subjective:    Patient ID: Emily Watson, female    DOB: 10/12/1945, 72 y.o.   MRN: [id number]  HPI: Emily Watson is a 72 y.o. female  Chief Complaint  Patient presents with  . Insect Bite    Started Monday. Thought it was a mosquito bite. Now is larger, worse and drainig.   Marland Kitchen. Anxiety    Patient states she's getting aggravated easily and anxious. Doesn't know if her Cymbalta is working anymore.   Pt here today for eval of a spider bite of posterior left leg x 4 days. Start out red itchy and swollen, now draining clear fluids and scabbing. Denies fever, chills, sweats. Using benadryl cream with no relief.   Relevant past medical, surgical, family and social history reviewed and updated as indicated. Interim medical history since our last visit reviewed. Allergies and medications reviewed and updated.  Review of Systems  Per HPI unless specifically indicated above     Objective:    LMP 02/01/1991 (Approximate)   Wt Readings from Last 3 Encounters:  09/05/17 189 lb 6.4 oz (85.9 kg)  06/18/17 185 lb 14.4 oz (84.3 kg)  05/18/17 186 lb 1.6 oz (84.4 kg)    Physical Exam  Constitutional: She is oriented to person, place, and time. She appears well-developed and well-nourished. No distress.  HENT:  Head: Atraumatic.  Eyes: Conjunctivae are normal.  Neck: Normal range of motion.  Cardiovascular: Normal rate, regular rhythm and normal heart sounds.  Pulmonary/Chest: Effort normal and breath sounds normal.  Musculoskeletal: Normal range of motion. She exhibits no edema.  Neurological: She is alert and oriented to person, place, and time.  Skin: Skin is warm and dry. There is erythema.  Insect bite with surrounding cellulitis posterior left leg. Some crusting and clear drainage present. Warm around wound  Psychiatric: She has a normal mood and affect. Her behavior is normal.  Nursing note and vitals reviewed.   Results for orders placed or  performed in visit on 09/05/17  Rapid St[facility] Screen ([facility])  Result Value Ref Range   Strep Gp A Ag, IA W/Reflex Negative Negative  Culture, Group A Strep  Result Value Ref Range   Strep A Culture Negative       Assessment & Plan:   Problem List Items Addressed This Visit    None    Visit Diagnoses    Cellulitis of left lower extremity    -  Primary   Start bactrim, antibiotic ointment, cold compresses, leg elevation. F/u if worsening or no improvement   Bug bite with infection, initial encounter       Relevant Medications   sulfamethoxazole-trimethoprim (BACTRIM DS,SEPTRA DS) 800-160 MG tablet       Follow up plan: Return if symptoms worsen or fail to improve.

## 2018-04-29 ENCOUNTER — Ambulatory Visit (INDEPENDENT_AMBULATORY_CARE_PROVIDER_SITE_OTHER): Payer: Medicare Other | Admitting: Family Medicine

## 2018-04-29 ENCOUNTER — Other Ambulatory Visit: Payer: Self-pay

## 2018-04-29 ENCOUNTER — Encounter: Payer: Self-pay | Admitting: Family Medicine

## 2018-04-29 VITALS — BP 133/81 | HR 66 | Temp 98.8°F | Ht 63.0 in | Wt 190.1 lb

## 2018-04-29 DIAGNOSIS — F321 Major depressive disorder, single episode, moderate: Secondary | ICD-10-CM

## 2018-04-29 DIAGNOSIS — L03115 Cellulitis of right lower limb: Secondary | ICD-10-CM | POA: Diagnosis not present

## 2018-04-29 DIAGNOSIS — Z23 Encounter for immunization: Secondary | ICD-10-CM

## 2018-04-29 MED ORDER — CEPHALEXIN 250 MG PO CAPS: 250.0000 mg | ORAL_CAPSULE | Freq: Three times a day (TID) | ORAL | 0 refills | Status: DC

## 2018-04-29 MED ORDER — FLUOXETINE HCL 20 MG PO TABS: 20.0000 mg | ORAL_TABLET | Freq: Every day | ORAL | 1 refills | Status: DC

## 2018-04-29 MED ORDER — HYDROXYZINE HCL 25 MG PO TABS: 25.0000 mg | ORAL_TABLET | Freq: Three times a day (TID) | ORAL | 1 refills | Status: DC | PRN

## 2018-04-29 MED ORDER — FLUOXETINE HCL 20 MG PO TABS: 20.0000 mg | ORAL_TABLET | Freq: Every day | ORAL | 3 refills | Status: DC

## 2018-04-29 NOTE — Progress Notes (Signed)
BP 133/81   Pulse 66   Temp 98.8 F (37.1 C) (Oral)   Ht 5\' 3"  (1.6 m)   Wt 190 lb 1.6 oz (86.2 kg)   LMP 02/01/1991 (Approximate)   SpO2 97%   BMI 33.67 kg/m    Subjective:    Patient ID: Emily Watson, female    DOB: 02-22-1946, 72 y.o.   MRN: [id number]  HPI: Emily Watson is a 72 y.o. female  Chief Complaint  Patient presents with  . Anxiety    pt would like to discuss duloxetine and a red spot she has on right leg since last nigh  . Depression   Bug bite on right calf that she noticed a day or two ago, now become red, hot, draining clear, very itchy. Starting to become painful. Using antibiotic ointment.   Depression f/u - currently on cymbalta. Gets very agitated with her dog quite often, as well as family members. Crying spells sometimes. Has also lost a lot of family members lately. Just feels like the medicine is helping sleep, minimal improvement to moods. Denies SI/HI.   Depression screen [facility] 2/9 04/29/2018 06/18/2017 05/18/2017  Decreased Interest 0 0 0  Down, Depressed, Hopeless 0 0 0  PHQ - 2 Score 0 0 0  Altered sleeping - 0 1  Tired, decreased energy - 0 0  Change in appetite - 0 0  Feeling bad or failure about yourself  0 0 0  Trouble concentrating 0 0 0  Moving slowly or fidgety/restless 0 0 0  Suicidal thoughts 0 0 0  PHQ-9 Score - 0 1  No flowsheet data found.  Relevant past medical, surgical, family and social history reviewed and updated as indicated. Interim medical history since our last visit reviewed. Allergies and medications reviewed and updated.  Review of Systems  Per HPI unless specifically indicated above     Objective:    BP 133/81   Pulse 66   Temp 98.8 F (37.1 C) (Oral)   Ht 5\' 3"  (1.6 m)   Wt 190 lb 1.6 oz (86.2 kg)   LMP 02/01/1991 (Approximate)   SpO2 97%   BMI 33.67 kg/m   Wt Readings from Last 3 Encounters:  04/29/18 190 lb 1.6 oz (86.2 kg)  09/05/17 189 lb 6.4 oz (85.9 kg)  06/18/17 185 lb 14.4 oz (84.3 kg)    Physical Exam  Constitutional: She appears well-developed and well-nourished.  HENT:  Head: Atraumatic.  Eyes: Conjunctivae and EOM are normal.  Neck: Normal range of motion. Neck supple.  Cardiovascular: Normal rate and regular rhythm.  Pulmonary/Chest: Effort normal and breath sounds normal.  Musculoskeletal: Normal range of motion.  Neurological: She is alert.  Skin: Skin is warm and dry. There is erythema.  Open area left posterior calf where a bug bite has been scratched, surrounding warmth, edema, and erythema. No active drainage  Psychiatric: She has a normal mood and affect. Her behavior is normal.  Nursing note and vitals reviewed.   Results for orders placed or performed in visit on 09/05/17  Rapid Strep Screen (Not at [facility])  Result Value Ref Range   Strep Gp A Ag, IA W/Reflex Negative Negative  Culture, Group A Strep  Result Value Ref Range   Strep A Culture Negative       Assessment & Plan:   Problem List Items Addressed This Visit      Other   Depression, major, single episode, moderate (HCC)    Minimal benefit on cymbalta. Will  switch to prozac and prn hydroxyzine for sleep and anxiety. Pt not wanting to try counseling at this point.       Relevant Medications   hydrOXYzine (ATARAX/VISTARIL) 25 MG tablet   FLUoxetine (PROZAC) 20 MG tablet    Other Visit Diagnoses    Cellulitis of right lower extremity    -  Primary   Start keflex, wash with soap and water BID and dress with antibiotic ointment   Flu vaccine need       Relevant Orders   Flu vaccine HIGH DOSE PF (Completed)       Follow up plan: Return in about 6 weeks (around 06/10/2018) for Mood f/u with Jolene.

## 2018-04-30 NOTE — Assessment & Plan Note (Signed)
Minimal benefit on cymbalta. Will switch to prozac and prn hydroxyzine for sleep and anxiety. Pt not wanting to try counseling at this point.

## 2018-04-30 NOTE — Patient Instructions (Signed)
Follow-up in 6 weeks

## 2018-05-31 ENCOUNTER — Encounter: Payer: Self-pay | Admitting: Nurse Practitioner

## 2018-05-31 ENCOUNTER — Ambulatory Visit (INDEPENDENT_AMBULATORY_CARE_PROVIDER_SITE_OTHER): Payer: Medicare Other | Admitting: Nurse Practitioner

## 2018-05-31 DIAGNOSIS — F321 Major depressive disorder, single episode, moderate: Secondary | ICD-10-CM

## 2018-05-31 DIAGNOSIS — G4700 Insomnia, unspecified: Secondary | ICD-10-CM | POA: Insufficient documentation

## 2018-05-31 DIAGNOSIS — F5101 Primary insomnia: Secondary | ICD-10-CM | POA: Diagnosis not present

## 2018-05-31 MED ORDER — TRAZODONE HCL 50 MG PO TABS: 25.0000 mg | ORAL_TABLET | Freq: Every day | ORAL | 2 refills | Status: DC

## 2018-05-31 NOTE — Assessment & Plan Note (Signed)
Chronic, ongoing.  Reports minimal benefit from Prozac and continued issues with sleep.  Will stop Prozac, educated patient on this.  Start Trazodone 25MG  PO QHS which offers benefit for depression and insomnia.  Continue to use Hydroxyzine AS NEEDED for sleep at HS.  Return in 4 weeks.

## 2018-05-31 NOTE — Assessment & Plan Note (Signed)
Chronic, ongoing.  Has not been taking PRN Hydroxyzine that was ordered one month ago, encouraged her to take this as needed.  Will discontinue Prozac at this time and change to Trazodone 25MG  PO QHS to start, which offers benefit for sleep and depression.  Patient to return in 4 weeks and will reassess, increase dose as needed.

## 2018-05-31 NOTE — Progress Notes (Signed)
BP 123/75 (BP Location: Left Arm, Patient Position: Sitting, Cuff Size: Normal)   Pulse 63   Temp 98.5 F (36.9 C) (Oral)   Ht 5\' 3"  (1.6 m)   Wt 185 lb 12.8 oz (84.3 kg)   LMP 02/01/1991 (Approximate)   SpO2 98%   BMI 32.91 kg/m    Subjective:    Patient ID: Emily Watson, female    DOB: [birth year], 72 y.o.   MRN: [id number]  HPI: Armonii Sieh is a 72 y.o. female presents for follow-up depression and insomnia.  Chief Complaint  Patient presents with  . Depression    6 week f/u. Still having trouble sleeping. Only taking Hydroxizine PRN.   DEPRESSION Mood status: exacerbated Satisfied with current treatment?: no Symptom severity: mild  Duration of current treatment : chronic Side effects: no Medication compliance: good compliance Psychotherapy/counseling: no none Previous psychiatric medications: Lexapro and Prozac Depressed mood: yes Anxious mood: yes Anhedonia: no Significant weight loss or gain: no Insomnia: yes hard to fall asleep Fatigue: no Feelings of worthlessness or guilt: no Impaired concentration/indecisiveness: no Suicidal ideations: no Hopelessness: no Crying spells: no   Insomnia: Chronic, ongoing.  Has not been sleeping well per her report d/t increased stressors in life, including losses and landlord issues.  States she has not been taking Hydroxyzine she was ordered as needed, for fear of it making her too tired.  Reports the Prozac has not been helping her mood or sleep pattern.  States when she gets to sleep she does not wake up often, but has a hard time getting to sleep.  Was given Prozac during her last visit, which she been taking and reports it is not helping.  Was also given PRN Hydroxyzine, which she reports she has not been taking as she is worried it will make her "too tired".  Reports she increased stressors in life at this time with recent deaths in family and difficulty with her landlord.  She feels this is causing increased issues  with depression and insomnia.    Relevant past medical, surgical, family and social history reviewed and updated as indicated. Interim medical history since our last visit reviewed. Allergies and medications reviewed and updated.  Review of Systems  Constitutional: Negative for activity change, appetite change and fatigue.  Respiratory: Negative for cough, chest tightness and shortness of breath.   Cardiovascular: Negative for chest pain, palpitations and leg swelling.  Gastrointestinal: Negative for abdominal distention and abdominal pain.  Neurological: Negative for dizziness, numbness and headaches.  Psychiatric/Behavioral: Positive for sleep disturbance. Negative for agitation, confusion and decreased concentration.    Per HPI unless specifically indicated above     Objective:    BP 123/75 (BP Location: Left Arm, Patient Position: Sitting, Cuff Size: Normal)   Pulse 63   Temp 98.5 F (36.9 C) (Oral)   Ht 5\' 3"  (1.6 m)   Wt 185 lb 12.8 oz (84.3 kg)   LMP 02/01/1991 (Approximate)   SpO2 98%   BMI 32.91 kg/m   Wt Readings from Last 3 Encounters:  05/31/18 185 lb 12.8 oz (84.3 kg)  04/29/18 190 lb 1.6 oz (86.2 kg)  09/05/17 189 lb 6.4 oz (85.9 kg)    Physical Exam  Constitutional: She is oriented to person, place, and time. She appears well-developed and well-nourished.  HENT:  Head: Normocephalic and atraumatic.  Eyes: Pupils are equal, round, and reactive to light.  Cardiovascular: Normal rate, regular rhythm, normal heart sounds and intact distal pulses.  Pulmonary/Chest: Effort normal  and breath sounds normal.  Abdominal: Soft. Bowel sounds are normal.  Neurological: She is alert and oriented to person, place, and time.  Skin: Skin is warm and dry.  Psychiatric: She has a normal mood and affect. Her behavior is normal. Judgment and thought content normal.  Very talkative    Depression screen [facility] 2/9 05/31/2018 04/29/2018 06/18/2017 05/18/2017 08/18/2016  Decreased  Interest 0 0 0 0 0  Down, Depressed, Hopeless 0 0 0 0 0  PHQ - 2 Score 0 0 0 0 0  Altered sleeping - - 0 1 1  Tired, decreased energy - - 0 0 1  Change in appetite - - 0 0 0  Feeling bad or failure about yourself  0 0 0 0 0  Trouble concentrating 1 0 0 0 0  Moving slowly or fidgety/restless 0 0 0 0 0  Suicidal thoughts 0 0 0 0 0  PHQ-9 Score - - 0 1 2    Results for orders placed or performed in visit on 09/05/17  Rapid Strep Screen (Not at [facility])  Result Value Ref Range   Strep Gp A Ag, IA W/Reflex Negative Negative  Culture, Group A Strep  Result Value Ref Range   Strep A Culture Negative       Assessment & Plan:   Problem List Items Addressed This Visit      Other   Depression, major, single episode, moderate (HCC)    Chronic, ongoing.  Reports minimal benefit from Prozac and continued issues with sleep.  Will stop Prozac, educated patient on this.  Start Trazodone 25MG  PO QHS which offers benefit for depression and insomnia.  Continue to use Hydroxyzine AS NEEDED for sleep at HS.  Return in 4 weeks.      Relevant Medications   traZODone (DESYREL) 50 MG tablet   Insomnia    Chronic, ongoing.  Has not been taking PRN Hydroxyzine that was ordered one month ago, encouraged her to take this as needed.  Will discontinue Prozac at this time and change to Trazodone 25MG  PO QHS to start, which offers benefit for sleep and depression.  Patient to return in 4 weeks and will reassess, increase dose as needed.          Follow up plan: Return in about 4 weeks (around 06/28/2018), or if symptoms worsen or fail to improve, for for depression.

## 2018-05-31 NOTE — Patient Instructions (Addendum)
Trazodone tablets  What is this medicine?  TRAZODONE (TRAZ oh done) is used to treat depression.  This medicine may be used for other purposes; ask your health care provider or pharmacist if you have questions.  COMMON BRAND NAME(S): Desyrel  What should I tell my health care provider before I take this medicine?  They need to know if you have any of these conditions:  -attempted suicide or thinking about it  -bipolar disorder  -bleeding problems  -glaucoma  -heart disease, or previous heart attack  -irregular heart beat  -kidney or liver disease  -low levels of sodium in the blood  -an unusual or allergic reaction to trazodone, other medicines, foods, dyes or preservatives  -pregnant or trying to get pregnant  -breast-feeding  How should I use this medicine?  Take this medicine by mouth with a glass of water. Follow the directions on the prescription label. Take this medicine shortly after a meal or a light snack. Take your medicine at regular intervals. Do not take your medicine more often than directed. Do not stop taking this medicine suddenly except upon the advice of your doctor. Stopping this medicine too quickly may cause serious side effects or your condition may worsen.  A special MedGuide will be given to you by the pharmacist with each prescription and refill. Be sure to read this information carefully each time.  Talk to your pediatrician regarding the use of this medicine in children. Special care may be needed.  Overdosage: If you think you have taken too much of this medicine contact a poison control center or emergency room at once.  NOTE: This medicine is only for you. Do not share this medicine with others.  What if I miss a dose?  If you miss a dose, take it as soon as you can. If it is almost time for your next dose, take only that dose. Do not take double or extra doses.  What may interact with this medicine?  Do not take this medicine with any of the following medications:  -certain medicines  for fungal infections like fluconazole, itraconazole, ketoconazole, posaconazole, voriconazole  -cisapride  -dofetilide  -dronedarone  -linezolid  -MAOIs like Carbex, Eldepryl, Marplan, Nardil, and Parnate  -mesoridazine  -methylene blue (injected into a vein)  -pimozide  -saquinavir  -thioridazine  -ziprasidone  This medicine may also interact with the following medications:  -alcohol  -antiviral medicines for HIV or AIDS  -aspirin and aspirin-like medicines  -barbiturates like phenobarbital  -certain medicines for blood pressure, heart disease, irregular heart beat  -certain medicines for depression, anxiety, or psychotic disturbances  -certain medicines for migraine headache like almotriptan, eletriptan, frovatriptan, naratriptan, rizatriptan, sumatriptan, zolmitriptan  -certain medicines for seizures like carbamazepine and phenytoin  -certain medicines for sleep  -certain medicines that treat or prevent blood clots like dalteparin, enoxaparin, warfarin  -digoxin  -fentanyl  -lithium  -NSAIDS, medicines for pain and inflammation, like ibuprofen or naproxen  -other medicines that prolong the QT interval (cause an abnormal heart rhythm)  -rasagiline  -supplements like St. John's wort, kava kava, valerian  -tramadol  -tryptophan  This list may not describe all possible interactions. Give your health care provider a list of all the medicines, herbs, non-prescription drugs, or dietary supplements you use. Also tell them if you smoke, drink alcohol, or use illegal drugs. Some items may interact with your medicine.  What should I watch for while using this medicine?  Tell your doctor if your symptoms do not get better   or if they get worse. Visit your doctor or health care professional for regular checks on your progress. Because it may take several weeks to see the full effects of this medicine, it is important to continue your treatment as prescribed by your doctor.  Patients and their families should watch out for new  or worsening thoughts of suicide or depression. Also watch out for sudden changes in feelings such as feeling anxious, agitated, panicky, irritable, hostile, aggressive, impulsive, severely restless, overly excited and hyperactive, or not being able to sleep. If this happens, especially at the beginning of treatment or after a change in dose, call your health care professional.  You may get drowsy or dizzy. Do not drive, use machinery, or do anything that needs mental alertness until you know how this medicine affects you. Do not stand or sit up quickly, especially if you are an older patient. This reduces the risk of dizzy or fainting spells. Alcohol may interfere with the effect of this medicine. Avoid alcoholic drinks.  This medicine may cause dry eyes and blurred vision. If you wear contact lenses you may feel some discomfort. Lubricating drops may help. See your eye doctor if the problem does not go away or is severe.  Your mouth may get dry. Chewing sugarless gum, sucking hard candy and drinking plenty of water may help. Contact your doctor if the problem does not go away or is severe.  What side effects may I notice from receiving this medicine?  Side effects that you should report to your doctor or health care professional as soon as possible:  -allergic reactions like skin rash, itching or hives, swelling of the face, lips, or tongue  -elevated mood, decreased need for sleep, racing thoughts, impulsive behavior  -confusion  -fast, irregular heartbeat  -feeling faint or lightheaded, falls  -feeling agitated, angry, or irritable  -loss of balance or coordination  -painful or prolonged erections  -restlessness, pacing, inability to keep still  -suicidal thoughts or other mood changes  -tremors  -trouble sleeping  -seizures  -unusual bleeding or bruising  Side effects that usually do not require medical attention (report to your doctor or health care professional if they continue or are bothersome):  -change in  sex drive or performance  -change in appetite or weight  -constipation  -headache  -muscle aches or pains  -nausea  This list may not describe all possible side effects. Call your doctor for medical advice about side effects. You may report side effects to FDA at 1-800-FDA-1088.  Where should I keep my medicine?  Keep out of the reach of children.  Store at room temperature between 15 and 30 degrees C (59 to 86 degrees F). Protect from light. Keep container tightly closed. Throw away any unused medicine after the expiration date.  NOTE: This sheet is a summary. It may not cover all possible information. If you have questions about this medicine, talk to your doctor, pharmacist, or health care provider.   2018 Elsevier/Gold Standard (2016-01-06 16:57:05)     Insomnia  Insomnia is a sleep disorder that makes it difficult to fall asleep or to stay asleep. Insomnia can cause tiredness (fatigue), low energy, difficulty concentrating, mood swings, and poor performance at work or school.  There are three different ways to classify insomnia:   Difficulty falling asleep.   Difficulty staying asleep.   Waking up too early in the morning.    Any type of insomnia can be long-term (chronic) or short-term (acute). Both are   common. Short-term insomnia usually lasts for three months or less. Chronic insomnia occurs at least three times a week for longer than three months.  What are the causes?  Insomnia may be caused by another condition, situation, or substance, such as:   Anxiety.   Certain medicines.   Gastroesophageal reflux disease (GERD) or other gastrointestinal conditions.   Asthma or other breathing conditions.   Restless legs syndrome, sleep apnea, or other sleep disorders.   Chronic pain.   Menopause. This may include hot flashes.   Stroke.   Abuse of alcohol, tobacco, or illegal drugs.   Depression.   Caffeine.   Neurological disorders, such as Alzheimer disease.   An overactive thyroid  (hyperthyroidism).    The cause of insomnia may not be known.  What increases the risk?  Risk factors for insomnia include:   Gender. Women are more commonly affected than men.   Age. Insomnia is more common as you get older.   Stress. This may involve your professional or personal life.   Income. Insomnia is more common in people with lower income.   Lack of exercise.   Irregular work schedule or night shifts.   Traveling between different time zones.    What are the signs or symptoms?  If you have insomnia, trouble falling asleep or trouble staying asleep is the main symptom. This may lead to other symptoms, such as:   Feeling fatigued.   Feeling nervous about going to sleep.   Not feeling rested in the morning.   Having trouble concentrating.   Feeling irritable, anxious, or depressed.    How is this treated?  Treatment for insomnia depends on the cause. If your insomnia is caused by an underlying condition, treatment will focus on addressing the condition. Treatment may also include:   Medicines to help you sleep.   Counseling or therapy.   Lifestyle adjustments.    Follow these instructions at home:   Take medicines only as directed by your health care provider.   Keep regular sleeping and waking hours. Avoid naps.   Keep a sleep diary to help you and your health care provider figure out what could be causing your insomnia. Include:  ? When you sleep.  ? When you wake up during the night.  ? How well you sleep.  ? How rested you feel the next day.  ? Any side effects of medicines you are taking.  ? What you eat and drink.   Make your bedroom a comfortable place where it is easy to fall asleep:  ? Put up shades or special blackout curtains to block light from outside.  ? Use a white noise machine to block noise.  ? Keep the temperature cool.   Exercise regularly as directed by your health care provider. Avoid exercising right before bedtime.   Use relaxation techniques to manage stress. Ask  your health care provider to suggest some techniques that may work well for you. These may include:  ? Breathing exercises.  ? Routines to release muscle tension.  ? Visualizing peaceful scenes.   Cut back on alcohol, caffeinated beverages, and cigarettes, especially close to bedtime. These can disrupt your sleep.   Do not overeat or eat spicy foods right before bedtime. This can lead to digestive discomfort that can make it hard for you to sleep.   Limit screen use before bedtime. This includes:  ? Watching TV.  ? Using your smartphone, tablet, and computer.   Stick to a routine.   This can help you fall asleep faster. Try to do a quiet activity, brush your teeth, and go to bed at the same time each night.   Get out of bed if you are still awake after 15 minutes of trying to sleep. Keep the lights down, but try reading or doing a quiet activity. When you feel sleepy, go back to bed.   Make sure that you drive carefully. Avoid driving if you feel very sleepy.   Keep all follow-up appointments as directed by your health care provider. This is important.  Contact a health care provider if:   You are tired throughout the day or have trouble in your daily routine due to sleepiness.   You continue to have sleep problems or your sleep problems get worse.  Get help right away if:   You have serious thoughts about hurting yourself or someone else.  This information is not intended to replace advice given to you by your health care provider. Make sure you discuss any questions you have with your health care provider.  Document Released: 08/04/2000 Document Revised: 01/07/2016 Document Reviewed: 05/08/2014  Elsevier Interactive Patient Education  2018 Elsevier Inc[employer]

## 2018-06-19 ENCOUNTER — Ambulatory Visit (INDEPENDENT_AMBULATORY_CARE_PROVIDER_SITE_OTHER): Payer: Medicare Other

## 2018-06-19 VITALS — BP 112/72 | HR 56 | Temp 98.3°F | Resp 14 | Ht 60.5 in | Wt 189.0 lb

## 2018-06-19 DIAGNOSIS — Z1159 Encounter for screening for other viral diseases: Secondary | ICD-10-CM

## 2018-06-19 DIAGNOSIS — Z0000 Encounter for general adult medical examination without abnormal findings: Secondary | ICD-10-CM

## 2018-06-19 DIAGNOSIS — M7520 Bicipital tendinitis, unspecified shoulder: Secondary | ICD-10-CM | POA: Insufficient documentation

## 2018-06-19 DIAGNOSIS — S46009A Unspecified injury of muscle(s) and tendon(s) of the rotator cuff of unspecified shoulder, initial encounter: Secondary | ICD-10-CM | POA: Insufficient documentation

## 2018-06-19 DIAGNOSIS — Z780 Asymptomatic menopausal state: Secondary | ICD-10-CM | POA: Diagnosis not present

## 2018-06-19 NOTE — Progress Notes (Signed)
Subjective:   Emily Watson is a 72 y.o. female who presents for Medicare Annual (Subsequent) preventive examination.  Review of Systems:   Cardiac Risk Factors include: advanced age (>49men, >69 women);hypertension;dyslipidemia     Objective:     Vitals: BP 112/72 (BP Location: Left Arm, Patient Position: Sitting, Cuff Size: Normal)   Pulse (!) 56   Temp 98.3 F (36.8 C)   Resp 14   Ht 5' 0.5" (1.537 m)   Wt 189 lb (85.7 kg)   LMP 02/01/1991 (Approximate)   SpO2 98%   BMI 36.30 kg/m   Body mass index is 36.3 kg/m.  Advanced Directives 06/18/2017 03/27/2017 03/14/2017  Does Patient Have a Medical Advance Directive? No No No  Does patient want to make changes to medical advance directive? Yes (MAU/Ambulatory/Procedural Areas - Information given) - -  Would patient like information on creating a medical advance directive? - No - Patient declined -    Tobacco Social History   Tobacco Use  Smoking Status Former Smoker  . Last attempt to quit: 03/14/1997  . Years since quitting: 21.2  Smokeless Tobacco Never Used     Counseling given: Not Answered   Clinical Intake:  Pre-visit preparation completed: Yes  Pain : 0-10 Pain Score: 4  Pain Type: Chronic pain Pain Location: Knee Pain Orientation: Right Pain Descriptors / Indicators: Aching, Sore Pain Onset: More than a month ago Pain Frequency: Constant     Nutritional Status: BMI > 30  Obese Nutritional Risks: None Diabetes: No  How often do you need to have someone help you when you read instructions, pamphlets, or other written materials from your doctor or pharmacy?: 1 - Never What is the last grade level you completed in school?: 12th grade  Interpreter Needed?: No  Information entered by :: Reather Littler LPN  Past Medical History:  Diagnosis Date  . Allergy   . Anaphylaxis   . Anxiety   . Arthritis   . Carpal tunnel syndrome   . Depression   . GERD (gastroesophageal reflux disease)   .  Hypertension    patient denies  . Peripheral edema   . Trigger thumb of left hand    Past Surgical History:  Procedure Laterality Date  . CARPAL TUNNEL RELEASE    . HERNIA REPAIR  04/2009  . ROTATOR CUFF REPAIR Left   . TOTAL KNEE ARTHROPLASTY Left 03/27/2017   Procedure: TOTAL KNEE ARTHROPLASTY - LEFT;  Surgeon: Kennedy Bucker, MD;  Location: [facility] ORS;  Service: Orthopedics;  Laterality: Left;   Family History  Problem Relation Age of Onset  . Kidney disease Mother   . Cancer Mother   . Kidney disease Father   . Cancer Father   . Cancer Sister        bone  . Hypertension Sister   . Osteoporosis Sister   . Other Son 41       brain tumor  . Alzheimer's disease Sister   . Hypertension Brother    Social History   Socioeconomic History  . Marital status: Divorced    Spouse name: Not on file  . Number of children: Not on file  . Years of education: Not on file  . Highest education level: High school graduate  Occupational History  . Occupation: retired  Engineer, production  . Financial resource strain: Not on file  . Food insecurity:    Worry: Not on file    Inability: Not on file  . Transportation needs:  Medical: Not on file    Non-medical: Not on file  Tobacco Use  . Smoking status: Former Smoker    Last attempt to quit: 03/14/1997    Years since quitting: 21.2  . Smokeless tobacco: Never Used  Substance and Sexual Activity  . Alcohol use: No    Alcohol/week: 0.0 standard drinks  . Drug use: No  . Sexual activity: Not on file  Lifestyle  . Physical activity:    Days per week: Not on file    Minutes per session: Not on file  . Stress: Not on file  Relationships  . Social connections:    Talks on phone: Not on file    Gets together: Not on file    Attends religious service: Not on file    Active member of club or organization: Not on file    Attends meetings of clubs or organizations: Not on file    Relationship status: Not on file  Other Topics Concern  . Not  on file  Social History Narrative  . Not on file    Outpatient Encounter Medications as of 06/19/2018  Medication Sig  . aspirin (ASPIRIN EC) 81 MG EC tablet Take 81 mg by mouth daily. Swallow whole.  . hydrOXYzine (ATARAX/VISTARIL) 25 MG tablet Take 1 tablet (25 mg total) by mouth 3 (three) times daily as needed.  . Multiple Vitamin (MULTIVITAMIN) tablet Take 1 tablet by mouth daily.  Marland Kitchen omeprazole (PRILOSEC OTC) 20 MG tablet Take 1 tablet (20 mg total) by mouth daily.  . traZODone (DESYREL) 50 MG tablet Take 0.5 tablets (25 mg total) by mouth at bedtime.   No facility-administered encounter medications on file as of 06/19/2018.     Activities of Daily Living In your present state of health, do you have any difficulty performing the following activities: 06/19/2018  Hearing? Y  Comment pt c/o ringing in the ears, declines hearing aids for now  Vision? N  Comment wears glasses  Difficulty concentrating or making decisions? N  Walking or climbing stairs? Y  Comment right knee pain  Dressing or bathing? N  Doing errands, shopping? N  Preparing Food and eating ? N  Using the Toilet? N  In the past six months, have you accidently leaked urine? N  Do you have problems with loss of bowel control? N  Managing your Medications? N  Managing your Finances? N  Housekeeping or managing your Housekeeping? N  Some recent data might be hidden    Patient Care Team: Marjie Skiff, NP as PCP - General (Nurse Practitioner) Kennedy Bucker, MD as Consulting Physician (Orthopedic Surgery)    Assessment:   This is a routine wellness examination for Emily Watson.  Exercise Activities and Dietary recommendations Current Exercise Habits: The patient does not participate in regular exercise at present  Goals   None     Fall Risk Fall Risk  06/19/2018 05/31/2018 06/18/2017 08/18/2016  Falls in the past year? No No No No   FALL RISK PREVENTION PERTAINING TO THE HOME:  Any stairs in or  around the home WITH handrails? Yes  Home free of loose throw rugs in walkways, pet beds, electrical cords, etc? Yes  Adequate lighting in your home to reduce risk of falls? Yes   ASSISTIVE DEVICES UTILIZED TO PREVENT FALLS:  Life alert? No  Use of a cane, walker or w/c? No  Grab bars in the bathroom? Yes  Shower chair or bench in shower? No  Elevated toilet seat or a handicapped  toilet? No   DME ORDERS:  DME order needed?  No   TIMED UP AND GO:  Was the test performed? Yes .  Length of time to ambulate 10 feet: 8 sec.   GAIT:  Appearance of gait: Gait stead-fast and without the use of an assistive device. OEducation: Fall risk prevention has been discussed.  Intervention(s) required? No   Depression Screen PHQ 2/9 Scores 06/19/2018 05/31/2018 04/29/2018 06/18/2017  PHQ - 2 Score 0 0 0 0  PHQ- 9 Score - - - 0     Cognitive Function     6CIT Screen 06/18/2017  What Year? 0 points  What month? 0 points  What time? 0 points  Count back from 20 0 points  Months in reverse 0 points  Repeat phrase 4 points  Total Score 4    Immunization History  Administered Date(s) Administered  . Influenza, High Dose Seasonal PF 06/08/2016, 05/21/2017, 04/29/2018  . Influenza,inj,Quad PF,6+ Mos 06/04/2015  . Pneumococcal Conjugate-13 07/20/2014  . Pneumococcal Polysaccharide-23 08/03/2015  . Td 08/21/2005    Qualifies for Shingles Vaccine? No   Due for Shingrix. Education has been provided regarding the importance of this vaccine. Pt has been advised to call insurance company to determine out of pocket expense. Advised may also receive vaccine at local pharmacy or Health Dept. Verbalized acceptance and understanding.  Tdap: Although this vaccine is not a covered service during a Wellness Exam, does the patient still wish to receive this vaccine today?  No .  Education has been provided regarding the importance of this vaccine. Advised may receive this vaccine at local pharmacy or  Health Dept. Aware to provide a copy of the vaccination record if obtained from local pharmacy or Health Dept. Verbalized acceptance and understanding.  Flu Vaccine: Up to date   Pneumococcal Vaccine: Up to date    Screening Tests Health Maintenance  Topic Date Due  . Hepatitis C Screening  Oct 22, 1945  . COLONOSCOPY  05/07/1998  . DEXA SCAN  05/07/2013  . TETANUS/TDAP  08/22/2015  . MAMMOGRAM  02/11/2018  . INFLUENZA VACCINE  Completed  . PNA vac Low Risk Adult  Completed    Cancer Screenings:  Colorectal Screening: pt declines  Mammogram: Completed 02/12/16.  Order pended for 07/01/18, pt needs to sign ABN for screening mammogram, will inform PCP. Pt provided with contact info and advised to call to schedule appt.   Bone Density:  Ordered today. Pt provided with contact info and advised to call to schedule appt.   Lung Cancer Screening: (Low Dose CT Chest recommended if Age 30-80 years, 30 pack-year currently smoking OR have quit w/in 15years.) does not qualify.    Additional Screening:  Hepatitis C Screening: does qualify; future order placed today.  Vision Screening: Recommended annual ophthalmology exams for early detection of glaucoma and other disorders of the eye. Is the patient up to date with their annual eye exam?  No  Who is the provider or what is the name of the office in which the pt attends annual eye exams? [facility]   Dental Screening: Recommended annual dental exams for proper oral hygiene  Community Resource Referral:  CRR required this visit?  No  pt on a fixed income from retirement and social security, states her budget is tight but she manages and receives food stamps; she switched her pharmacy to [facility] for more affordable medications.      Plan:    I have personally reviewed and addressed the Medicare  Annual Wellness questionnaire and have noted the following in the patient's chart:  A. Medical and social history B. Use of  alcohol, tobacco or illicit drugs  C. Current medications and supplements D. Functional ability and status E.  Nutritional status F.  Physical activity G. Advance directives H. List of other physicians I.  Hospitalizations, surgeries, and ER visits in previous 12 months J.  Vitals K. Screenings such as hearing and vision if needed, cognitive and depression L. Referrals and appointments   In addition, I have reviewed and discussed with patient certain preventive protocols, quality metrics, and best practice recommendations. A written personalized care plan for preventive services as well as general preventive health recommendations were provided to patient.   Signed,  Reather Littler, LPN Nurse Health Advisor   Nurse Notes: Mammogram ordered pended, pt will need to sign ABN at next visit with PCP.

## 2018-06-19 NOTE — Patient Instructions (Signed)
Emily Watson , Thank you for taking time to come for your Medicare Wellness Visit. I appreciate your ongoing commitment to your health goals. Please review the following plan we discussed and let me know if I can assist you in the future.   Screening recommendations/referrals: Colonoscopy: declined  Mammogram: Please call [phone] to schedule your mammogram and bone density scan.   Recommended yearly ophthalmology/optometry visit for glaucoma screening and checkup Recommended yearly dental visit for hygiene and checkup  Vaccinations: Influenza vaccine: Up to date Pneumococcal vaccine: Up to date Tdap vaccine: due call if cut or scrape Shingles vaccine: Shingrix discussed     Advanced directives: Advance directive discussed with you today. I have provided a copy for you to complete at home and have notarized. Once this is complete please bring a copy in to our office so we can scan it into your chart.  Conditions/risks identified: recommend drinking 6-8 glasses of water per day   Next appointment: 07/01/18 with Emily Watson 9:30 amBone Densitometry Bone densitometry is an imaging test that uses a special X-ray to measure the amount of calcium and other minerals in your bones (bone density). This test is also known as a bone mineral density test or dual-energy X-ray absorptiometry (DXA). The test can measure bone density at your hip and your spine. It is similar to having a regular X-ray. You may have this test to:  Diagnose a condition that causes weak or thin bones (osteoporosis).  Predict your risk of a broken bone (fracture).  Determine how well osteoporosis treatment is working.  Tell a health care provider about:  Any allergies you have.  All medicines you are taking, including vitamins, herbs, eye drops, creams, and over-the-counter medicines.  Any problems you or family members have had with anesthetic medicines.  Any blood disorders you have.  Any surgeries you  have had.  Any medical conditions you have.  Possibility of pregnancy.  Any other medical test you had within the previous 14 days that used contrast material. What are the risks? Generally, this is a safe procedure. However, problems can occur and may include the following:  This test exposes you to a very small amount of radiation.  The risks of radiation exposure may be greater to unborn children.  What happens before the procedure?  Do not take any calcium supplements for 24 hours before having the test. You can otherwise eat and drink what you usually do.  Take off all metal jewelry, eyeglasses, dental appliances, and any other metal objects. What happens during the procedure?  You may lie on an exam table. There will be an X-ray generator below you and an imaging device above you.  Other devices, such as boxes or braces, may be used to position your body properly for the scan.  You will need to lie still while the machine slowly scans your body.  The images will show up on a computer monitor. What happens after the procedure? You may need more testing at a later time. This information is not intended to replace advice given to you by your health care provider. Make sure you discuss any questions you have with your health care provider. Document Released: 08/29/2004 Document Revised: 01/13/2016 Document Reviewed: 01/15/2014 Elsevier Interactive Patient Education  2018 [employer].  Mammogram A mammogram is an X-ray of the breasts that is done to check for changes that are not normal. This test can screen for and find any changes that may suggest breast cancer. This test  can also help to find other changes and variations in the breast. What happens before the procedure? Have this test done about 1-2 weeks after your period. This is usually when your breasts are the least tender. If you are visiting a new doctor or clinic, send any past mammogram images to your new doctor's  office. Wash your breasts and under your arms the day of the test. Do not use deodorants, perfumes, lotions, or powders on the day of the test. Take off any jewelry from your neck. Wear clothes that you can change into and out of easily. What happens during the procedure? You will undress from the waist up. You will put on a gown. You will stand in front of the X-ray machine. Each breast will be placed between two plastic or glass plates. The plates will press down on your breast for a few seconds. Try to stay as relaxed as possible. This does not cause any harm to your breasts. Any discomfort you feel will be very brief. X-rays will be taken from different angles of each breast. The procedure may vary among doctors and hospitals. What happens after the procedure? The mammogram will be looked at by a specialist (radiologist). You may need to do certain parts of the test again. This depends on the quality of the images. Ask when your test results will be ready. Make sure you get your test results. You may go back to your normal activities. This information is not intended to replace advice given to you by your health care provider. Make sure you discuss any questions you have with your health care provider. Document Released: 11/03/2008 Document Revised: 01/13/2016 Document Reviewed: 10/16/2014 Elsevier Interactive Patient Education  2018 [employer].    Preventive Care 65 Years and Older, Female Preventive care refers to lifestyle choices and visits with your health care provider that can promote health and wellness. What does preventive care include?  A yearly physical exam. This is also called an annual well check.  Dental exams once or twice a year.  Routine eye exams. Ask your health care provider how often you should have your eyes checked.  Personal lifestyle choices, including:  Daily care of your teeth and gums.  Regular physical activity.  Eating a healthy  diet.  Avoiding tobacco and drug use.  Limiting alcohol use.  Practicing safe sex.  Taking low-dose aspirin every day.  Taking vitamin and mineral supplements as recommended by your health care provider. What happens during an annual well check? The services and screenings done by your health care provider during your annual well check will depend on your age, overall health, lifestyle risk factors, and family history of disease. Counseling  Your health care provider may ask you questions about your:  Alcohol use.  Tobacco use.  Drug use.  Emotional well-being.  Home and relationship well-being.  Sexual activity.  Eating habits.  History of falls.  Memory and ability to understand (cognition).  Work and work Astronomer.  Reproductive health. Screening  You may have the following tests or measurements:  Height, weight, and BMI.  Blood pressure.  Lipid and cholesterol levels. These may be checked every 5 years, or more frequently if you are over 47 years old.  Skin check.  Lung cancer screening. You may have this screening every year starting at age 13 if you have a 30-pack-year history of smoking and currently smoke or have quit within the past 15 years.  Fecal occult blood test (FOBT) of the  stool. You may have this test every year starting at age 41.  Flexible sigmoidoscopy or colonoscopy. You may have a sigmoidoscopy every 5 years or a colonoscopy every 10 years starting at age 31.  Hepatitis C blood test.  Hepatitis B blood test.  Sexually transmitted disease (STD) testing.  Diabetes screening. This is done by checking your blood sugar (glucose) after you have not eaten for a while (fasting). You may have this done every 1-3 years.  Bone density scan. This is done to screen for osteoporosis. You may have this done starting at age 24.  Mammogram. This may be done every 1-2 years. Talk to your health care provider about how often you should have  regular mammograms. Talk with your health care provider about your test results, treatment options, and if necessary, the need for more tests. Vaccines  Your health care provider may recommend certain vaccines, such as:  Influenza vaccine. This is recommended every year.  Tetanus, diphtheria, and acellular pertussis (Tdap, Td) vaccine. You may need a Td booster every 10 years.  Zoster vaccine. You may need this after age 18.  Pneumococcal 13-valent conjugate (PCV13) vaccine. One dose is recommended after age 64.  Pneumococcal polysaccharide (PPSV23) vaccine. One dose is recommended after age 47. Talk to your health care provider about which screenings and vaccines you need and how often you need them. This information is not intended to replace advice given to you by your health care provider. Make sure you discuss any questions you have with your health care provider. Document Released: 09/03/2015 Document Revised: 04/26/2016 Document Reviewed: 06/08/2015 Elsevier Interactive Patient Education  2017 [employer].  Fall Prevention in the Home Falls can cause injuries. They can happen to people of all ages. There are many things you can do to make your home safe and to help prevent falls. What can I do on the outside of my home?  Regularly fix the edges of walkways and driveways and fix any cracks.  Remove anything that might make you trip as you walk through a door, such as a raised step or threshold.  Trim any bushes or trees on the path to your home.  Use bright outdoor lighting.  Clear any walking paths of anything that might make someone trip, such as rocks or tools.  Regularly check to see if handrails are loose or broken. Make sure that both sides of any steps have handrails.  Any raised decks and porches should have guardrails on the edges.  Have any leaves, snow, or ice cleared regularly.  Use sand or salt on walking paths during winter.  Clean up any spills in your  garage right away. This includes oil or grease spills. What can I do in the bathroom?  Use night lights.  Install grab bars by the toilet and in the tub and shower. Do not use towel bars as grab bars.  Use non-skid mats or decals in the tub or shower.  If you need to sit down in the shower, use a plastic, non-slip stool.  Keep the floor dry. Clean up any water that spills on the floor as soon as it happens.  Remove soap buildup in the tub or shower regularly.  Attach bath mats securely with double-sided non-slip rug tape.  Do not have throw rugs and other things on the floor that can make you trip. What can I do in the bedroom?  Use night lights.  Make sure that you have a light by your bed  that is easy to reach.  Do not use any sheets or blankets that are too big for your bed. They should not hang down onto the floor.  Have a firm chair that has side arms. You can use this for support while you get dressed.  Do not have throw rugs and other things on the floor that can make you trip. What can I do in the kitchen?  Clean up any spills right away.  Avoid walking on wet floors.  Keep items that you use a lot in easy-to-reach places.  If you need to reach something above you, use a strong step stool that has a grab bar.  Keep electrical cords out of the way.  Do not use floor polish or wax that makes floors slippery. If you must use wax, use non-skid floor wax.  Do not have throw rugs and other things on the floor that can make you trip. What can I do with my stairs?  Do not leave any items on the stairs.  Make sure that there are handrails on both sides of the stairs and use them. Fix handrails that are broken or loose. Make sure that handrails are as long as the stairways.  Check any carpeting to make sure that it is firmly attached to the stairs. Fix any carpet that is loose or worn.  Avoid having throw rugs at the top or bottom of the stairs. If you do have throw  rugs, attach them to the floor with carpet tape.  Make sure that you have a light switch at the top of the stairs and the bottom of the stairs. If you do not have them, ask someone to add them for you. What else can I do to help prevent falls?  Wear shoes that:  Do not have high heels.  Have rubber bottoms.  Are comfortable and fit you well.  Are closed at the toe. Do not wear sandals.  If you use a stepladder:  Make sure that it is fully opened. Do not climb a closed stepladder.  Make sure that both sides of the stepladder are locked into place.  Ask someone to hold it for you, if possible.  Clearly mark and make sure that you can see:  Any grab bars or handrails.  First and last steps.  Where the edge of each step is.  Use tools that help you move around (mobility aids) if they are needed. These include:  Canes.  Walkers.  Scooters.  Crutches.  Turn on the lights when you go into a dark area. Replace any light bulbs as soon as they burn out.  Set up your furniture so you have a clear path. Avoid moving your furniture around.  If any of your floors are uneven, fix them.  If there are any pets around you, be aware of where they are.  Review your medicines with your doctor. Some medicines can make you feel dizzy. This can increase your chance of falling. Ask your doctor what other things that you can do to help prevent falls. This information is not intended to replace advice given to you by your health care provider. Make sure you discuss any questions you have with your health care provider. Document Released: 06/03/2009 Document Revised: 01/13/2016 Document Reviewed: 09/11/2014 Elsevier Interactive Patient Education  2017 [employer].

## 2018-07-01 ENCOUNTER — Encounter: Payer: Self-pay | Admitting: Nurse Practitioner

## 2018-07-01 ENCOUNTER — Other Ambulatory Visit: Payer: Self-pay

## 2018-07-01 ENCOUNTER — Ambulatory Visit (INDEPENDENT_AMBULATORY_CARE_PROVIDER_SITE_OTHER): Payer: Medicare Other | Admitting: Nurse Practitioner

## 2018-07-01 ENCOUNTER — Telehealth: Payer: Self-pay | Admitting: Nurse Practitioner

## 2018-07-01 VITALS — BP 126/65 | HR 58 | Temp 98.5°F | Ht 62.0 in | Wt 186.0 lb

## 2018-07-01 DIAGNOSIS — F5101 Primary insomnia: Secondary | ICD-10-CM

## 2018-07-01 DIAGNOSIS — F321 Major depressive disorder, single episode, moderate: Secondary | ICD-10-CM

## 2018-07-01 DIAGNOSIS — F411 Generalized anxiety disorder: Secondary | ICD-10-CM | POA: Diagnosis not present

## 2018-07-01 MED ORDER — BUSPIRONE HCL 5 MG PO TABS: 5.0000 mg | ORAL_TABLET | ORAL | 3 refills | Status: DC | PRN

## 2018-07-01 NOTE — Assessment & Plan Note (Signed)
Chronic, improved with Trazodone (offers benefit to sleep and mood).  No ADR.  Continue current regimen and adjust dose as needed.

## 2018-07-01 NOTE — Patient Instructions (Signed)

## 2018-07-01 NOTE — Telephone Encounter (Signed)
Copied from CRM [id number]. Topic: Quick Communication - Rx Refill/Question >> Jul 01, 2018  2:29 PM Crist Infante wrote: Medication: traZODone (DESYREL) 50 MG tablet  Pt wants to know if she should keep taking .5 tab of this or a whole tab of this med? Pt states she forgot to ask at her appt today.

## 2018-07-01 NOTE — Telephone Encounter (Signed)
She should only take a 1/2 a tablet, as this is working well for her.  We will only increase if needed.

## 2018-07-01 NOTE — Assessment & Plan Note (Signed)
Chronic, improved with Trazodone.  Continue current medication regimen.

## 2018-07-01 NOTE — Assessment & Plan Note (Signed)
Chronic, ongoing.  Does not use Hydroxyzine d/t concerns for fatigue and side effects.  Switch to Buspar as needed.

## 2018-07-01 NOTE — Progress Notes (Signed)
BP 126/65   Pulse (!) 58   Temp 98.5 F (36.9 C) (Oral)   Ht 5\' 2"  (1.575 m)   Wt 186 lb (84.4 kg)   LMP 02/01/1991 (Approximate)   SpO2 96%   BMI 34.02 kg/m    Subjective:    Patient ID: Emily Watson, female    DOB: October 12, 1945, 72 y.o.   MRN: [id number]  HPI: Marvelyn Bouchillon is a 72 y.o. female presents for depression, anxiety, insomnia follow-up  Chief Complaint  Patient presents with  . Depression    DEPRESSION/ANXIETY Last visit discontinued Prozac and initiated Trazodone, for benefit of mood and sleep, during last visit.  Continues on Hydroxyzine, but does not use due to concern of side effects.  Discussed Buspar with patient, risks/benefits.  She would prefer trying it as needed for anxiety. Mood status: better Satisfied with current treatment?: yes Symptom severity: mild  Duration of current treatment : chronic Side effects: no Medication compliance: excellent compliance Psychotherapy/counseling: no none Previous psychiatric medications: Prozac and Hydroxyzine Depressed mood: yes Anxious mood: yes Anhedonia: no Significant weight loss or gain: no Insomnia: no sleeping better with Trazodone Fatigue: no Feelings of worthlessness or guilt: no Impaired concentration/indecisiveness: no Suicidal ideations: no Hopelessness: no Crying spells: no Depression screen [facility] 2/9 07/01/2018 06/19/2018 05/31/2018 04/29/2018 06/18/2017  Decreased Interest 0 0 0 0 0  Down, Depressed, Hopeless 0 0 0 0 0  PHQ - 2 Score 0 0 0 0 0  Altered sleeping 0 - - - 0  Tired, decreased energy 0 - - - 0  Change in appetite 0 - - - 0  Feeling bad or failure about yourself  0 - 0 0 0  Trouble concentrating 0 - 1 0 0  Moving slowly or fidgety/restless 0 - 0 0 0  Suicidal thoughts 0 - 0 0 0  PHQ-9 Score 0 - - - 0  Difficult doing work/chores Not difficult at all - - - -   GAD 7 : Generalized Anxiety Score 07/01/2018 05/31/2018  Nervous, Anxious, on Edge 1 0  Control/stop worrying 0 0    Worry too much - different things 0 1  Trouble relaxing 0 0  Restless 0 0  Easily annoyed or irritable 0 2  Afraid - awful might happen 0 0  Total GAD 7 Score 1 3  Anxiety Difficulty Not difficult at all -    INSOMNIA Started Trazodone at last visit.  Reports she is sleeping better with it, only waking up occasionally to urinate and then has a hard time falling back to sleep d/t turning on the light when using bathroom.  Encouraged use of night light for low light. Duration: chronic Satisfied with sleep quality: yes Difficulty falling asleep: no Difficulty staying asleep: yes Waking a few hours after sleep onset: no Early morning awakenings: no Daytime hypersomnolence: no Wakes feeling refreshed: yes Good sleep hygiene: yes Apnea: no Snoring: no Depressed/anxious mood: yes Recent stress: no Restless legs/nocturnal leg cramps: no Chronic pain/arthritis: no History of sleep study: no Treatments attempted: melatonin   Relevant past medical, surgical, family and social history reviewed and updated as indicated. Interim medical history since our last visit reviewed. Allergies and medications reviewed and updated.  Review of Systems  Constitutional: Negative for activity change, appetite change and fatigue.  Respiratory: Negative for cough, chest tightness and shortness of breath.   Cardiovascular: Negative for chest pain, palpitations and leg swelling.  Gastrointestinal: Negative for abdominal distention, abdominal pain, constipation, diarrhea, nausea and vomiting.  Musculoskeletal: Negative.   Skin: Negative.   Allergic/Immunologic: Negative.   Neurological: Negative for dizziness, tremors, syncope, speech difficulty, weakness, light-headedness, numbness and headaches.  Psychiatric/Behavioral: Negative for behavioral problems, confusion, decreased concentration, sleep disturbance and suicidal ideas. The patient is not nervous/anxious.     Per HPI unless specifically  indicated above     Objective:    BP 126/65   Pulse (!) 58   Temp 98.5 F (36.9 C) (Oral)   Ht 5\' 2"  (1.575 m)   Wt 186 lb (84.4 kg)   LMP 02/01/1991 (Approximate)   SpO2 96%   BMI 34.02 kg/m   Wt Readings from Last 3 Encounters:  07/01/18 186 lb (84.4 kg)  06/19/18 189 lb (85.7 kg)  05/31/18 185 lb 12.8 oz (84.3 kg)    Physical Exam  Constitutional: She is oriented to person, place, and time. She appears well-developed and well-nourished.  HENT:  Head: Normocephalic and atraumatic.  Right Ear: Hearing normal.  Left Ear: Hearing normal.  Eyes: Pupils are equal, round, and reactive to light. Conjunctivae and EOM are normal. Right eye exhibits no discharge. Left eye exhibits no discharge.  Neck: Normal range of motion. Neck supple. No JVD present. Carotid bruit is not present. No thyromegaly present.  Cardiovascular: Normal rate, regular rhythm, normal heart sounds and intact distal pulses.  Pulmonary/Chest: Effort normal and breath sounds normal.  Abdominal: Soft. Bowel sounds are normal. There is no splenomegaly or hepatomegaly.  Musculoskeletal: Normal range of motion.  Lymphadenopathy:    She has no cervical adenopathy.  Neurological: She is alert and oriented to person, place, and time. She has normal reflexes.  Skin: Skin is warm and dry.  Psychiatric: She has a normal mood and affect. Her speech is normal and behavior is normal. Judgment and thought content normal. Cognition and memory are normal.    Results for orders placed or performed in visit on 09/05/17  Rapid Strep Screen (Not at [facility])  Result Value Ref Range   Strep Gp A Ag, IA W/Reflex Negative Negative  Culture, Group A Strep  Result Value Ref Range   Strep A Culture Negative       Assessment & Plan:   Problem List Items Addressed This Visit      Other   Generalized anxiety disorder    Chronic, ongoing.  Does not use Hydroxyzine d/t concerns for fatigue and side effects.  Switch to Buspar as  needed.      Relevant Medications   busPIRone (BUSPAR) 5 MG tablet   Depression, major, single episode, moderate (HCC) - Primary    Chronic, improved with Trazodone (offers benefit to sleep and mood).  No ADR.  Continue current regimen and adjust dose as needed.      Relevant Medications   busPIRone (BUSPAR) 5 MG tablet   Insomnia    Chronic, improved with Trazodone.  Continue current medication regimen.         Time: 25 minutes, >50% spent counseling and discussing changes to medication regimen + sleep hygiene techniques  Follow up plan: Return in about 6 months (around 12/30/2018) for Depression, HTN, GERD.

## 2018-07-09 ENCOUNTER — Other Ambulatory Visit: Payer: Self-pay

## 2018-10-22 ENCOUNTER — Encounter: Payer: Self-pay | Admitting: Nurse Practitioner

## 2018-10-22 ENCOUNTER — Ambulatory Visit (INDEPENDENT_AMBULATORY_CARE_PROVIDER_SITE_OTHER): Payer: Medicare Other | Admitting: Nurse Practitioner

## 2018-10-22 VITALS — BP 133/79 | HR 78 | Temp 98.4°F | Ht 62.0 in | Wt 187.2 lb

## 2018-10-22 DIAGNOSIS — Z780 Asymptomatic menopausal state: Secondary | ICD-10-CM

## 2018-10-22 DIAGNOSIS — Z1239 Encounter for other screening for malignant neoplasm of breast: Secondary | ICD-10-CM

## 2018-10-22 DIAGNOSIS — J0100 Acute maxillary sinusitis, unspecified: Secondary | ICD-10-CM

## 2018-10-22 DIAGNOSIS — Z87898 Personal history of other specified conditions: Secondary | ICD-10-CM

## 2018-10-22 LAB — VERITOR FLU A/B WAIVED
INFLUENZA B: NEGATIVE
Influenza A: NEGATIVE

## 2018-10-22 MED ORDER — DOXYCYCLINE HYCLATE 100 MG PO TABS: 100.0000 mg | ORAL_TABLET | Freq: Two times a day (BID) | ORAL | 0 refills | Status: AC

## 2018-10-22 NOTE — Assessment & Plan Note (Signed)
Acute, negative flu testing.  Script sent for Doxycycline.  Recommended use of Coricidin at home for symptom control and educated on medication.  May use saline nasal spray for nasal congestion.  Sinus rinses and humidifier at home for comfort.  Return for worsening or continued symptoms.

## 2018-10-22 NOTE — Progress Notes (Signed)
BP 133/79 (BP Location: Left Arm, Patient Position: Sitting, Cuff Size: Large)   Pulse 78   Temp 98.4 F (36.9 C)   Ht  (1.575 m)   Wt 187 lb 4 oz (84.9 kg)   LMP 02/01/1991 (Approximate)   SpO2 94%   BMI 34.25 kg/m    Subjective:    Patient ID: Emily Watson, female    DOB: 1946-03-04, 73 y.o.   MRN: [id number]  HPI: Emily Watson is a 73 y.o. female  Chief Complaint  Patient presents with  . URI    Started Saturday, cough, headache, runny nose, chest pain due to cough, and pressure in head  . mammogram    Please order mammo, patient states that she will go to [facility] location  . cold hands    Patient states that it started when she was outside looking at the lights, she said that they get extremely cold, but she can get them warm    UPPER RESPIRATORY TRACT INFECTION Started with symptoms on Saturday.   States she was around her great grandkids, but none of them were sick.  Denies any flu exposure.  Has h/o sinus infections.   Worst symptom: sinus pressure and headache Fever: no Cough: yes Shortness of breath: no Wheezing: no Chest pain: yes, with cough Chest tightness: yes Chest congestion: yes Nasal congestion: yes Runny nose: yes Post nasal drip: yes Sneezing: yes Sore throat: no Swollen glands: no Sinus pressure: yes Headache: yes Face pain: no Toothache: no Ear pain: no pain, only ringing Ear pressure: yes bilateral Eyes red/itching:no Eye drainage/crusting: no  Vomiting: no Rash: no Fatigue: yes Sick contacts: no Strep contacts: no  Context: worse Recurrent sinusitis: no Relief with OTC cold/cough medications: no  Treatments attempted: none   Relevant past medical, surgical, family and social history reviewed and updated as indicated. Interim medical history since our last visit reviewed. Allergies and medications reviewed and updated.  Review of Systems  Constitutional: Positive for fatigue. Negative for activity change, appetite change  and fever.  HENT: Positive for congestion, postnasal drip, rhinorrhea and sinus pressure. Negative for ear discharge, ear pain, facial swelling, sinus pain, sneezing, sore throat and voice change.   Eyes: Negative for pain and visual disturbance.  Respiratory: Positive for cough and chest tightness. Negative for shortness of breath and wheezing.   Cardiovascular: Negative for chest pain, palpitations and leg swelling.  Gastrointestinal: Negative for abdominal distention, abdominal pain, constipation, diarrhea, nausea and vomiting.  Endocrine: Negative.   Musculoskeletal: Negative for myalgias.  Neurological: Negative for dizziness, weakness, numbness and headaches.  Psychiatric/Behavioral: Negative.     Per HPI unless specifically indicated above     Objective:    BP 133/79 (BP Location: Left Arm, Patient Position: Sitting, Cuff Size: Large)   Pulse 78   Temp 98.4 F (36.9 C)   Ht  (1.575 m)   Wt 187 lb 4 oz (84.9 kg)   LMP 02/01/1991 (Approximate)   SpO2 94%   BMI 34.25 kg/m   Wt Readings from Last 3 Encounters:  10/22/18 187 lb 4 oz (84.9 kg)  07/01/18 186 lb (84.4 kg)  06/19/18 189 lb (85.7 kg)    Physical Exam Vitals signs and nursing note reviewed.  Constitutional:      General: She is awake.     Appearance: She is well-developed.  HENT:     Head: Normocephalic.     Right Ear: Hearing, ear canal and external ear normal. No drainage. A middle  ear effusion is present.     Left Ear: Hearing, ear canal and external ear normal. No drainage. A middle ear effusion is present.     Nose: Mucosal edema and rhinorrhea present. Rhinorrhea is purulent.     Right Sinus: Maxillary sinus tenderness present. No frontal sinus tenderness.     Left Sinus: Maxillary sinus tenderness present. No frontal sinus tenderness.     Mouth/Throat:     Mouth: Mucous membranes are moist.     Pharynx: Posterior oropharyngeal erythema (mild with cobblestoning) present. No pharyngeal swelling or  oropharyngeal exudate.     Tonsils: Swelling: 0 on the right. 0 on the left.  Eyes:     General:        Right eye: No discharge.        Left eye: No discharge.     Conjunctiva/sclera: Conjunctivae normal.     Pupils: Pupils are equal, round, and reactive to light.  Neck:     Musculoskeletal: Normal range of motion and neck supple.     Thyroid: No thyromegaly.     Vascular: No carotid bruit or JVD.  Cardiovascular:     Rate and Rhythm: Normal rate and regular rhythm.     Heart sounds: Normal heart sounds.  Pulmonary:     Effort: Pulmonary effort is normal.     Breath sounds: Normal breath sounds.     Comments: Clear throughout Abdominal:     General: Bowel sounds are normal.     Palpations: Abdomen is soft.  Lymphadenopathy:     Head:     Right side of head: No submental, submandibular, tonsillar or preauricular adenopathy.     Left side of head: No submental, submandibular, tonsillar or preauricular adenopathy.     Cervical: No cervical adenopathy.  Skin:    General: Skin is warm and dry.  Neurological:     Mental Status: She is alert and oriented to person, place, and time.     Deep Tendon Reflexes: Reflexes are normal and symmetric.  Psychiatric:        Attention and Perception: Attention normal.        Mood and Affect: Mood normal.        Speech: Speech normal.        Behavior: Behavior normal. Behavior is cooperative.        Thought Content: Thought content normal.        Judgment: Judgment normal.     Results for orders placed or performed in visit on 09/05/17  Rapid Strep Screen (Not at [facility])  Result Value Ref Range   Strep Gp A Ag, IA W/Reflex Negative Negative  Culture, Group A Strep  Result Value Ref Range   Strep A Culture Negative       Assessment & Plan:   Problem List Items Addressed This Visit      Respiratory   Sinusitis, acute, maxillary    Acute, negative flu testing.  Script sent for Doxycycline.  Recommended use of Coricidin at home for  symptom control and educated on medication.  May use saline nasal spray for nasal congestion.  Sinus rinses and humidifier at home for comfort.  Return for worsening or continued symptoms.      Relevant Medications   doxycycline (VIBRA-TABS) 100 MG tablet    Other Visit Diagnoses    Screening for breast cancer    -  Primary   Relevant Orders   Veritor Flu A/B Waived   Postmenopausal estrogen deficiency  Relevant Orders   DG Bone Density   History of abnormal mammogram       Relevant Orders   MM DIGITAL SCREENING BILATERAL       Follow up plan: Return if symptoms worsen or fail to improve.

## 2018-10-22 NOTE — Patient Instructions (Signed)
May Take Coricidin for symptom control.   Sinusitis, Adult Sinusitis is soreness and swelling (inflammation) of your sinuses. Sinuses are hollow spaces in the bones around your face. They are located:  Around your eyes.  In the middle of your forehead.  Behind your nose.  In your cheekbones. Your sinuses and nasal passages are lined with a fluid called mucus. Mucus drains out of your sinuses. Swelling can trap mucus in your sinuses. This lets germs (bacteria, virus, or fungus) grow, which leads to infection. Most of the time, this condition is caused by a virus. What are the causes? This condition is caused by:  Allergies.  Asthma.  Germs.  Things that block your nose or sinuses.  Growths in the nose (nasal polyps).  Chemicals or irritants in the air.  Fungus (rare). What increases the risk? You are more likely to develop this condition if:  You have a weak body defense system (immune system).  You do a lot of swimming or diving.  You use nasal sprays too much.  You smoke. What are the signs or symptoms? The main symptoms of this condition are pain and a feeling of pressure around the sinuses. Other symptoms include:  Stuffy nose (congestion).  Runny nose (drainage).  Swelling and warmth in the sinuses.  Headache.  Toothache.  A cough that may get worse at night.  Mucus that collects in the throat or the back of the nose (postnasal drip).  Being unable to smell and taste.  Being very tired (fatigue).  A fever.  Sore throat.  Bad breath. How is this diagnosed? This condition is diagnosed based on:  Your symptoms.  Your medical history.  A physical exam.  Tests to find out if your condition is short-term (acute) or long-term (chronic). Your doctor may: ? Check your nose for growths (polyps). ? Check your sinuses using a tool that has a light (endoscope). ? Check for allergies or germs. ? Do imaging tests, such as an MRI or CT scan. How is  this treated? Treatment for this condition depends on the cause and whether it is short-term or long-term.  If caused by a virus, your symptoms should go away on their own within 10 days. You may be given medicines to relieve symptoms. They include: ? Medicines that shrink swollen tissue in the nose. ? Medicines that treat allergies (antihistamines). ? A spray that treats swelling of the nostrils. ? Rinses that help get rid of thick mucus in your nose (nasal saline washes).  If caused by bacteria, your doctor may wait to see if you will get better without treatment. You may be given antibiotic medicine if you have: ? A very bad infection. ? A weak body defense system.  If caused by growths in the nose, you may need to have surgery. Follow these instructions at home: Medicines  Take, use, or apply over-the-counter and prescription medicines only as told by your doctor. These may include nasal sprays.  If you were prescribed an antibiotic medicine, take it as told by your doctor. Do not stop taking the antibiotic even if you start to feel better. Hydrate and humidify   Drink enough water to keep your pee (urine) pale yellow.  Use a cool mist humidifier to keep the humidity level in your home above 50%.  Breathe in steam for 10-15 minutes, 3-4 times a day, or as told by your doctor. You can do this in the bathroom while a hot shower is running.  Try not  to spend time in cool or dry air. Rest  Rest as much as you can.  Sleep with your head raised (elevated).  Make sure you get enough sleep each night. General instructions   Put a warm, moist washcloth on your face 3-4 times a day, or as often as told by your doctor. This will help with discomfort.  Wash your hands often with soap and water. If there is no soap and water, use hand sanitizer.  Do not smoke. Avoid being around people who are smoking (secondhand smoke).  Keep all follow-up visits as told by your doctor. This is  important. Contact a doctor if:  You have a fever.  Your symptoms get worse.  Your symptoms do not get better within 10 days. Get help right away if:  You have a very bad headache.  You cannot stop throwing up (vomiting).  You have very bad pain or swelling around your face or eyes.  You have trouble seeing.  You feel confused.  Your neck is stiff.  You have trouble breathing. Summary  Sinusitis is swelling of your sinuses. Sinuses are hollow spaces in the bones around your face.  This condition is caused by tissues in your nose that become inflamed or swollen. This traps germs. These can lead to infection.  If you were prescribed an antibiotic medicine, take it as told by your doctor. Do not stop taking it even if you start to feel better.  Keep all follow-up visits as told by your doctor. This is important. This information is not intended to replace advice given to you by your health care provider. Make sure you discuss any questions you have with your health care provider. Document Released: 01/24/2008 Document Revised: 01/07/2018 Document Reviewed: 01/07/2018 Elsevier Interactive Patient Education  2019 [employer].

## 2018-11-13 ENCOUNTER — Other Ambulatory Visit: Payer: Self-pay | Admitting: Nurse Practitioner

## 2018-11-13 DIAGNOSIS — F321 Major depressive disorder, single episode, moderate: Secondary | ICD-10-CM

## 2018-11-14 ENCOUNTER — Other Ambulatory Visit: Payer: Self-pay | Admitting: Nurse Practitioner

## 2018-11-14 DIAGNOSIS — F321 Major depressive disorder, single episode, moderate: Secondary | ICD-10-CM

## 2018-11-14 NOTE — Telephone Encounter (Signed)
Requested Prescriptions  Pending Prescriptions Disp Refills  . traZODone (DESYREL) 50 MG tablet [Pharmacy Med Name: traZODone HCl 50 MG Oral Tablet] 30 tablet 0    Sig: TAKE 1/2 (ONE-HALF) TABLET BY MOUTH AT BEDTIME     Psychiatry: Antidepressants - Serotonin Modulator Passed - 11/14/2018  9:42 AM      Passed - Completed PHQ-2 or PHQ-9 in the last 360 days.      Passed - Valid encounter within last 6 months    Recent Outpatient Visits          3 weeks ago Screening for breast cancer   [facility] [place], Jolene T, NP   4 months ago Depression, major, single episode, moderate (HCC)   [facility] [place], Harlan T, NP   5 months ago Depression, major, single episode, moderate (HCC)   [facility] [place], [place] T, NP   6 months ago Cellulitis of right lower extremity   [facility] [place] Maser [place], [place]   8 months ago Cellulitis of left lower extremity   [facility] [place], Salley Hews, PA-C      Future Appointments            In 1 month Cannady, Dorie Rank, NP [facility]   In 7 months  [facility]

## 2018-11-29 ENCOUNTER — Other Ambulatory Visit: Payer: Self-pay | Admitting: Nurse Practitioner

## 2018-12-28 ENCOUNTER — Other Ambulatory Visit: Payer: Self-pay | Admitting: Nurse Practitioner

## 2018-12-30 ENCOUNTER — Ambulatory Visit: Payer: Medicare Other | Admitting: Nurse Practitioner

## 2019-01-15 ENCOUNTER — Other Ambulatory Visit: Payer: Self-pay | Admitting: Nurse Practitioner

## 2019-01-15 DIAGNOSIS — F321 Major depressive disorder, single episode, moderate: Secondary | ICD-10-CM

## 2019-01-30 ENCOUNTER — Encounter: Payer: Self-pay | Admitting: Nurse Practitioner

## 2019-01-30 ENCOUNTER — Other Ambulatory Visit: Payer: Self-pay

## 2019-01-30 ENCOUNTER — Ambulatory Visit (INDEPENDENT_AMBULATORY_CARE_PROVIDER_SITE_OTHER): Payer: Medicare Other | Admitting: Nurse Practitioner

## 2019-01-30 VITALS — BP 148/69 | HR 60 | Ht 63.0 in

## 2019-01-30 DIAGNOSIS — E7800 Pure hypercholesterolemia, unspecified: Secondary | ICD-10-CM

## 2019-01-30 DIAGNOSIS — I10 Essential (primary) hypertension: Secondary | ICD-10-CM

## 2019-01-30 DIAGNOSIS — Z1329 Encounter for screening for other suspected endocrine disorder: Secondary | ICD-10-CM

## 2019-01-30 DIAGNOSIS — F321 Major depressive disorder, single episode, moderate: Secondary | ICD-10-CM

## 2019-01-30 DIAGNOSIS — F411 Generalized anxiety disorder: Secondary | ICD-10-CM | POA: Diagnosis not present

## 2019-01-30 DIAGNOSIS — K219 Gastro-esophageal reflux disease without esophagitis: Secondary | ICD-10-CM

## 2019-01-30 DIAGNOSIS — Z131 Encounter for screening for diabetes mellitus: Secondary | ICD-10-CM

## 2019-01-30 NOTE — Assessment & Plan Note (Signed)
Chronic, ongoing.  Check outpatient lipid panel and initiate medications as indicated and if patient agrees with plan.

## 2019-01-30 NOTE — Assessment & Plan Note (Signed)
Chronic, ongoing.  Continue current medication regimen, has benefit from Buspar.

## 2019-01-30 NOTE — Patient Instructions (Signed)

## 2019-01-30 NOTE — Assessment & Plan Note (Signed)
Chronic, ongoing.  Will increase Trazodone to 50 MG at night (benefit for mood and sleep) + continue Buspar as needed.  Return in 4 weeks for follow-up mood.

## 2019-01-30 NOTE — Assessment & Plan Note (Signed)
Chronic, stable on current regimen.  Continue current medication regimen and obtain outpatient Mag level.

## 2019-01-30 NOTE — Assessment & Plan Note (Addendum)
Chronic, ongoing.  Have recommended monitoring BP at home 3 mornings a week and documenting for provider.  Initial BPs today are below goal and third one was slightly elevated.  Will monitor and initiate medications if consistently elevated.  Will obtain outpatient labs.

## 2019-01-30 NOTE — Progress Notes (Signed)
BP (!) 148/69   Pulse 60   Ht 5\' 3"  (1.6 m)   LMP 02/01/1991 (Approximate)   BMI 33.17 kg/m    Subjective:    Patient ID: Emily Watson, female    DOB: [birth year], 73 y.o.   MRN: [id number]  HPI: Emily Watson is a 73 y.o. female  Chief Complaint  Patient presents with  . Depression    1134m f/u  . Hypertension  . Gastroesophageal Reflux    . This visit was completed via Doximity due to the restrictions of the COVID-19 pandemic. All issues as above were discussed and addressed. Physical exam was done as above through visual confirmation on Doximity. If it was felt that the patient should be evaluated in the office, they were directed there. The patient verbally consented to this visit. . Location of the patient: home . Location of the provider: home . Those involved with this call:  . Provider: Aura DialsJolene Cannady, DNP . CMA: Elton SinAnita Quito, CMA . Front Desk/Registration: Harriet PhoJoliza Johnson  . Time spent on call: 15 minutes with patient face to face via video conference. More than 50% of this time was spent in counseling and coordination of care. 10 minutes total spent in review of patient's record and preparation of their chart. I verified patient identity using two factors (patient name and date of birth). Patient consents verbally to being seen via telemedicine visit today.   DEPRESSION/ANXIETY Continues on Trazodone 25 MG at bedtime and Buspar as needed (currently taking once or twice a day), which was started at last visit due to patient concern about Hydroxyzine. She reports some improvement with Buspar, but states some issues with mood and sleep remain as current Covid pandemic has caused some anxiety.  Discussed that we have lots of room to increase Trazodone dose, she is interested in trying this. Mood status: stable Satisfied with current treatment?: no Symptom severity: mild  Duration of current treatment : chronic Side effects: no Medication compliance: good compliance  Psychotherapy/counseling: none Depressed mood: no Anxious mood: yes Anhedonia: no Significant weight loss or gain: no Insomnia: yes hard to stay asleep Fatigue: no Feelings of worthlessness or guilt: no Impaired concentration/indecisiveness: no Suicidal ideations: no Hopelessness: no Crying spells: no Depression screen [facility] 2[facility] 01/30/2019 10/22/2018 07/01/2018 06/19/2018 05/31/2018  Decreased Interest 0 0 0 0 0  Down, Depressed, Hopeless 1 0 0 0 0  PHQ - 2 Score 1 0 0 0 0  Altered sleeping 1 - 0 - -  Tired, decreased energy 0 - 0 - -  Change in appetite 0 - 0 - -  Feeling bad or failure about yourself  0 - 0 - 0  Trouble concentrating 0 - 0 - 1  Moving slowly or fidgety/restless 0 - 0 - 0  Suicidal thoughts 0 - 0 - 0  PHQ-9 Score 2 - 0 - -  Difficult doing work/chores Not difficult at all - Not difficult at all - -   GAD 7 : Generalized Anxiety Score 01/30/2019 07/01/2018 05/31/2018  Nervous, Anxious, on Edge 1 1 0  Control/stop worrying 0 0 0  Worry too much - different things 1 0 1  Trouble relaxing 0 0 0  Restless 0 0 0  Easily annoyed or irritable 1 0 2  Afraid - awful might happen 1 0 0  Total GAD 7 Score 4 1 3   Anxiety Difficulty - Not difficult at all -    GERD Continues on Omeprazole daily. GERD control status: controlled  Satisfied  with current treatment? yes Heartburn frequency: none Medication side effects: no  Medication compliance: stable Previous GERD medications: Dysphagia: no Odynophagia:  no Hematemesis: no Blood in stool: no EGD: no   HYPERTENSION / HYPERLIPIDEMIA No current medications.  Is on daily ASA. Satisfied with current treatment? yes Duration of hypertension: chronic BP monitoring frequency: rarely BP range: 109/65 this morning at 0830, then at 0845 125/67, then ate something and BP was slightly elevated BP medication side effects: no Duration of hyperlipidemia: chronic Aspirin: no Recent stressors: no Recurrent headaches: no  Visual changes: no Palpitations: no Dyspnea: no Chest pain: no Lower extremity edema: no Dizzy/lightheaded: no  Relevant past medical, surgical, family and social history reviewed and updated as indicated. Interim medical history since our last visit reviewed. Allergies and medications reviewed and updated.  Review of Systems  Constitutional: Negative for activity change, appetite change, diaphoresis, fatigue and fever.  Respiratory: Negative for cough, chest tightness and shortness of breath.   Cardiovascular: Negative for chest pain, palpitations and leg swelling.  Gastrointestinal: Negative for abdominal distention, abdominal pain, constipation, diarrhea, nausea and vomiting.  Endocrine: Negative for cold intolerance, heat intolerance, polydipsia, polyphagia and polyuria.  Neurological: Negative for dizziness, syncope, weakness, light-headedness, numbness and headaches.  Psychiatric/Behavioral: Negative.     Per HPI unless specifically indicated above     Objective:    BP (!) 148/69   Pulse 60   Ht 5\' 3"  (1.6 m)   LMP 02/01/1991 (Approximate)   BMI 33.17 kg/m   Wt Readings from Last 3 Encounters:  10/22/18 187 lb 4 oz (84.9 kg)  07/01/18 186 lb (84.4 kg)  06/19/18 189 lb (85.7 kg)    Physical Exam Vitals signs and nursing note reviewed.  Constitutional:      General: She is awake. She is not in acute distress.    Appearance: She is well-developed. She is not ill-appearing.  HENT:     Head: Normocephalic.     Right Ear: Hearing normal.     Left Ear: Hearing normal.  Eyes:     General: Lids are normal.        Right eye: No discharge.        Left eye: No discharge.     Conjunctiva/sclera: Conjunctivae normal.  Neck:     Musculoskeletal: Normal range of motion.  Cardiovascular:     Comments: Unable to auscultate due to virtual exam only Pulmonary:     Effort: Pulmonary effort is normal. No accessory muscle usage or respiratory distress.     Comments: Unable to  auscultate due to virtual exam only Neurological:     Mental Status: She is alert and oriented to person, place, and time.  Psychiatric:        Attention and Perception: Attention normal.        Mood and Affect: Mood normal.        Behavior: Behavior normal. Behavior is cooperative.        Thought Content: Thought content normal.        Judgment: Judgment normal.     Results for orders placed or performed in visit on 10/22/18  Veritor Flu A/B Waived  Result Value Ref Range   Influenza A Negative Negative   Influenza B Negative Negative      Assessment & Plan:   Problem List Items Addressed This Visit      Cardiovascular and Mediastinum   Hypertension    Chronic, ongoing.  Have recommended monitoring BP at home 3 mornings a  week and documenting for provider.  Initial BPs today are below goal and third one was slightly elevated.  Will monitor and initiate medications if consistently elevated.  Will obtain outpatient labs.      Relevant Orders   CBC with Differential/Platelet   Comprehensive metabolic panel     Digestive   GERD (gastroesophageal reflux disease)    Chronic, stable on current regimen.  Continue current medication regimen and obtain outpatient Mag level.          Other   Hypercholesteremia    Chronic, ongoing.  Check outpatient lipid panel and initiate medications as indicated and if patient agrees with plan.      Relevant Orders   Comprehensive metabolic panel   Lipid Panel w/o Chol/HDL Ratio   Generalized anxiety disorder    Chronic, ongoing.  Continue current medication regimen, has benefit from Buspar.        Depression, major, single episode, moderate (HCC) - Primary    Chronic, ongoing.  Will increase Trazodone to 50 MG at night (benefit for mood and sleep) + continue Buspar as needed.  Return in 4 weeks for follow-up mood.       Other Visit Diagnoses    Thyroid disorder screen       Relevant Orders   TSH   Diabetes mellitus screening        Relevant Orders   HgB A1c      I discussed the assessment and treatment plan with the patient. The patient was provided an opportunity to ask questions and all were answered. The patient agreed with the plan and demonstrated an understanding of the instructions.  The patient was advised to call back or seek an in-person evaluation if the symptoms worsen or if the condition fails to improve as anticipated.  I provided 15 minutes of time during this encounter.  Follow up plan: Return in about 4 weeks (around 02/27/2019) for Mood follow-up.

## 2019-02-01 ENCOUNTER — Other Ambulatory Visit: Payer: Self-pay | Admitting: Nurse Practitioner

## 2019-02-03 ENCOUNTER — Other Ambulatory Visit: Payer: Medicare Other

## 2019-02-03 ENCOUNTER — Other Ambulatory Visit: Payer: Self-pay

## 2019-02-03 DIAGNOSIS — Z1329 Encounter for screening for other suspected endocrine disorder: Secondary | ICD-10-CM

## 2019-02-03 DIAGNOSIS — Z131 Encounter for screening for diabetes mellitus: Secondary | ICD-10-CM

## 2019-02-03 DIAGNOSIS — I10 Essential (primary) hypertension: Secondary | ICD-10-CM

## 2019-02-03 DIAGNOSIS — E7800 Pure hypercholesterolemia, unspecified: Secondary | ICD-10-CM

## 2019-02-04 ENCOUNTER — Other Ambulatory Visit: Payer: Self-pay | Admitting: Nurse Practitioner

## 2019-02-04 ENCOUNTER — Telehealth: Payer: Self-pay | Admitting: Nurse Practitioner

## 2019-02-04 LAB — TSH: TSH: 1.03 u[IU]/mL (ref 0.450–4.500)

## 2019-02-04 LAB — CBC WITH DIFFERENTIAL/PLATELET
Basophils Absolute: 0.1 10*3/uL (ref 0.0–0.2)
Basos: 1 %
EOS (ABSOLUTE): 0.2 10*3/uL (ref 0.0–0.4)
Eos: 3 %
Hematocrit: 37.3 % (ref 34.0–46.6)
Hemoglobin: 12.6 g/dL (ref 11.1–15.9)
Immature Grans (Abs): 0 10*3/uL (ref 0.0–0.1)
Immature Granulocytes: 0 %
Lymphocytes Absolute: 1.9 10*3/uL (ref 0.7–3.1)
Lymphs: 35 %
MCH: 31.7 pg (ref 26.6–33.0)
MCHC: 33.8 g/dL (ref 31.5–35.7)
MCV: 94 fL (ref 79–97)
Monocytes Absolute: 0.4 10*3/uL (ref 0.1–0.9)
Monocytes: 7 %
Neutrophils Absolute: 2.9 10*3/uL (ref 1.4–7.0)
Neutrophils: 54 %
Platelets: 356 10*3/uL (ref 150–450)
RBC: 3.98 x10E6/uL (ref 3.77–5.28)
RDW: 14.7 % (ref 11.7–15.4)
WBC: 5.4 10*3/uL (ref 3.4–10.8)

## 2019-02-04 LAB — COMPREHENSIVE METABOLIC PANEL
ALT: 10 IU/L (ref 0–32)
AST: 18 IU/L (ref 0–40)
Albumin/Globulin Ratio: 1.6 (ref 1.2–2.2)
Albumin: 4.2 g/dL (ref 3.8–4.8)
Alkaline Phosphatase: 58 IU/L (ref 39–117)
BUN/Creatinine Ratio: 19 (ref 12–28)
BUN: 13 mg/dL (ref 8–27)
Bilirubin Total: 0.6 mg/dL (ref 0.0–1.2)
CO2: 25 mmol/L (ref 20–29)
Calcium: 9.4 mg/dL (ref 8.7–10.3)
Chloride: 102 mmol/L (ref 96–106)
Creatinine, Ser: 0.67 mg/dL (ref 0.57–1.00)
GFR calc Af Amer: 103 mL/min/{1.73_m2} (ref >59)
GFR calc non Af Amer: 89 mL/min/{1.73_m2} (ref >59)
Globulin, Total: 2.7 g/dL (ref 1.5–4.5)
Glucose: 87 mg/dL (ref 65–99)
Potassium: 4.6 mmol/L (ref 3.5–5.2)
Sodium: 142 mmol/L (ref 134–144)
Total Protein: 6.9 g/dL (ref 6.0–8.5)

## 2019-02-04 LAB — LIPID PANEL W/O CHOL/HDL RATIO
Cholesterol, Total: 224 mg/dL — ABNORMAL HIGH (ref 100–199)
HDL: 55 mg/dL (ref >39)
LDL Calculated: 147 mg/dL — ABNORMAL HIGH (ref 0–99)
Triglycerides: 112 mg/dL (ref 0–149)
VLDL Cholesterol Cal: 22 mg/dL (ref 5–40)

## 2019-02-04 LAB — HEMOGLOBIN A1C
Est. average glucose Bld gHb Est-mCnc: 108 mg/dL
Hgb A1c MFr Bld: 5.4 % (ref 4.8–5.6)

## 2019-02-04 MED ORDER — ATORVASTATIN CALCIUM 10 MG PO TABS: 10.0000 mg | ORAL_TABLET | Freq: Every day | ORAL | 3 refills | Status: DC

## 2019-02-04 NOTE — Telephone Encounter (Signed)
Sent in medication for cholesterol and spoke to patient via telephone.

## 2019-02-04 NOTE — Telephone Encounter (Signed)
Patient states a medication for her blood pressure was supposed to be sent to pharmacy today. She could not remember name of medication but states she was advised by pharmacy to contact PCP as no BP medication has been sent in.

## 2019-02-04 NOTE — Progress Notes (Signed)
Adding on Atorvastatin due to high TCHOL and LDL with high risk score:  The 10-year ASCVD risk score Mikey Bussing DC Brooke Bonito., et al., 2013) is: 12.8%   Values used to calculate the score:     Age: 73 years     Sex: Female     Is Non-Hispanic African American: No     Diabetic: No     Tobacco smoker: No     Systolic Blood Pressure: 098 mmHg     Is BP treated: No     HDL Cholesterol: 55 mg/dL     Total Cholesterol: 224 mg/dL   Will start low and increase as needed.

## 2019-02-10 ENCOUNTER — Telehealth: Payer: Self-pay

## 2019-02-10 NOTE — Telephone Encounter (Signed)
Patient notified

## 2019-02-10 NOTE — Telephone Encounter (Signed)
Those are good numbers. Often statin does not cause decreased BP.  I would recommend increasing her water intake and continuing to monitor daily.  Big side effect of statin is leg cramps.

## 2019-02-10 NOTE — Telephone Encounter (Signed)
Patient called and states that her BP is 108/65 Pulse is 61. She wants to know if this is ok, since she started the atorvastatin on 02/05/19, she is concerned that this may be because of the new medication.

## 2019-03-01 ENCOUNTER — Other Ambulatory Visit: Payer: Self-pay | Admitting: Nurse Practitioner

## 2019-03-02 NOTE — Telephone Encounter (Signed)
Requested Prescriptions  Pending Prescriptions Disp Refills  . busPIRone (BUSPAR) 5 MG tablet [Pharmacy Med Name: busPIRone HCl 5 MG Oral Tablet] 60 tablet 0    Sig: Take 1 tablet by mouth twice daily as needed for anxiety     Psychiatry: Anxiolytics/Hypnotics - Non-controlled Passed - 03/01/2019 10:39 AM      Passed - Valid encounter within last 6 months    Recent Outpatient Visits          1 month ago Depression, major, single episode, moderate ([place])   [place], [place] T, NP   4 months ago Screening for breast cancer   [place], Jolene T, NP   8 months ago Depression, major, single episode, moderate ([place])   [place], Jolene T, NP   9 months ago Depression, major, single episode, moderate ([place])   [place], Rogers T, NP   10 months ago Cellulitis of right lower extremity   [place], Lilia Argue, PA-C      Future Appointments            In 2 days [place], Barbaraann Faster, NP [facility], [place]   In 3 months  [facility], [facility]

## 2019-03-04 ENCOUNTER — Ambulatory Visit (INDEPENDENT_AMBULATORY_CARE_PROVIDER_SITE_OTHER): Payer: Medicare Other | Admitting: Nurse Practitioner

## 2019-03-04 ENCOUNTER — Encounter: Payer: Self-pay | Admitting: Nurse Practitioner

## 2019-03-04 ENCOUNTER — Other Ambulatory Visit: Payer: Self-pay

## 2019-03-04 VITALS — BP 120/62 | HR 54

## 2019-03-04 DIAGNOSIS — F321 Major depressive disorder, single episode, moderate: Secondary | ICD-10-CM

## 2019-03-04 DIAGNOSIS — E7800 Pure hypercholesterolemia, unspecified: Secondary | ICD-10-CM | POA: Diagnosis not present

## 2019-03-04 NOTE — Progress Notes (Signed)
BP 120/62   Pulse (!) 54   LMP 02/01/1991 (Approximate)    Subjective:    Patient ID: Emily Watson, female    DOB: 04/17/1946, 73 y.o.   MRN: [id number]  HPI: Emily Watson is a 73 y.o. female  Chief Complaint  Patient presents with  . Depression    . This visit was completed via Doximity due to the restrictions of the COVID-19 pandemic. All issues as above were discussed and addressed. Physical exam was done as above through visual confirmation on Doximity. If it was felt that the patient should be evaluated in the office, they were directed there. The patient verbally consented to this visit. . Location of the patient: home . Location of the provider: work . Those involved with this call:  . Provider: Marnee Guarneri, DNP . CMA: Yvonna Alanis, CMA . Front Desk/Registration: Jill Side  . Time spent on call: 15 minutes with patient face to face via video conference. More than 50% of this time was spent in counseling and coordination of care. 5 minutes total spent in review of patient's record and preparation of their chart. I verified patient identity using two factors (patient name and date of birth). Patient consents verbally to being seen via telemedicine visit today.   DEPRESSION Trazodone increased at last visit to 50 MG at night.  Continues on Buspar as needed.  Reports she has been driving around to help mood.  She reports a better mood and sleep with increase in medication. Mood status: stable Satisfied with current treatment?: yes Symptom severity: mild  Duration of current treatment : chronic Side effects: no Medication compliance: good compliance Psychotherapy/counseling: none Depressed mood: no Anxious mood: no Anhedonia: no Significant weight loss or gain: no Insomnia: none Fatigue: no Feelings of worthlessness or guilt: no Impaired concentration/indecisiveness: no Suicidal ideations: no Hopelessness: no Crying spells: no Depression screen [facility]  2/9 03/04/2019 01/30/2019 10/22/2018 07/01/2018 06/19/2018  Decreased Interest 0 0 0 0 0  Down, Depressed, Hopeless 1 1 0 0 0  PHQ - 2 Score 1 1 0 0 0  Altered sleeping 0 1 - 0 -  Tired, decreased energy 0 0 - 0 -  Change in appetite 0 0 - 0 -  Feeling bad or failure about yourself  0 0 - 0 -  Trouble concentrating 0 0 - 0 -  Moving slowly or fidgety/restless 0 0 - 0 -  Suicidal thoughts 0 0 - 0 -  PHQ-9 Score 1 2 - 0 -  Difficult doing work/chores - Not difficult at all - Not difficult at all -   HYPERLIPIDEMIA Started on Lipitor 10 MG on June 17th.  02/03/19 labs LDL 147 and TCHOL 224.  Denies any ADr with medication. Hyperlipidemia status: good compliance Satisfied with current treatment?  yes Side effects:  no Medication compliance: good compliance Past cholesterol meds: atorvastain (lipitor) Supplements: none Aspirin:  yes The 10-year ASCVD risk score Mikey Bussing DC Jr., et al., 2013) is: 8.7%   Values used to calculate the score:     Age: 86 years     Sex: Female     Is Non-Hispanic African American: No     Diabetic: No     Tobacco smoker: No     Systolic Blood Pressure: 097 mmHg     Is BP treated: No     HDL Cholesterol: 55 mg/dL     Total Cholesterol: 224 mg/dL Chest pain:  no Coronary artery disease:  no Family history CAD:  no Family history early CAD:  no  Relevant past medical, surgical, family and social history reviewed and updated as indicated. Interim medical history since our last visit reviewed. Allergies and medications reviewed and updated.  Review of Systems  Constitutional: Negative for activity change, appetite change, diaphoresis, fatigue and fever.  Respiratory: Negative for cough, chest tightness and shortness of breath.   Cardiovascular: Negative for chest pain, palpitations and leg swelling.  Gastrointestinal: Negative for abdominal distention, abdominal pain, constipation, diarrhea, nausea and vomiting.  Neurological: Negative for dizziness, syncope,  weakness, light-headedness, numbness and headaches.  Psychiatric/Behavioral: Negative.     Per HPI unless specifically indicated above     Objective:    BP 120/62   Pulse (!) 54   LMP 02/01/1991 (Approximate)   Wt Readings from Last 3 Encounters:  10/22/18 187 lb 4 oz (84.9 kg)  07/01/18 186 lb (84.4 kg)  06/19/18 189 lb (85.7 kg)    Physical Exam Vitals signs and nursing note reviewed.  Constitutional:      General: She is awake. She is not in acute distress.    Appearance: She is well-developed. She is not ill-appearing.  HENT:     Head: Normocephalic.     Right Ear: Hearing normal.     Left Ear: Hearing normal.  Eyes:     General: Lids are normal.        Right eye: No discharge.        Left eye: No discharge.     Conjunctiva/sclera: Conjunctivae normal.  Neck:     Musculoskeletal: Normal range of motion.  Cardiovascular:     Comments: Unable to auscultate due to virtual exam only  Pulmonary:     Effort: Pulmonary effort is normal. No accessory muscle usage or respiratory distress.     Comments: Unable to auscultate due to virtual exam only  Neurological:     Mental Status: She is alert and oriented to person, place, and time.  Psychiatric:        Attention and Perception: Attention normal.        Mood and Affect: Mood normal.        Behavior: Behavior normal. Behavior is cooperative.        Thought Content: Thought content normal.        Judgment: Judgment normal.     Results for orders placed or performed in visit on 02/03/19  TSH  Result Value Ref Range   TSH 1.030 0.450 - 4.500 uIU/mL  Lipid Panel w/o Chol/HDL Ratio  Result Value Ref Range   Cholesterol, Total 224 (H) 100 - 199 mg/dL   Triglycerides 161112 0 - 149 mg/dL   HDL 55 >09>39 mg/dL   VLDL Cholesterol Cal 22 5 - 40 mg/dL   LDL Calculated 604147 (H) 0 - 99 mg/dL  HgB V4UA1c  Result Value Ref Range   Hgb A1c MFr Bld 5.4 4.8 - 5.6 %   Est. average glucose Bld gHb Est-mCnc 108 mg/dL  Comprehensive  metabolic panel  Result Value Ref Range   Glucose 87 65 - 99 mg/dL   BUN 13 8 - 27 mg/dL   Creatinine, Ser 9.810.67 0.57 - 1.00 mg/dL   GFR calc non Af Amer 89 >59 mL/min/1.73   GFR calc Af Amer 103 >59 mL/min/1.73   BUN/Creatinine Ratio 19 12 - 28   Sodium 142 134 - 144 mmol/L   Potassium 4.6 3.5 - 5.2 mmol/L   Chloride 102 96 - 106 mmol/L   CO2 25  20 - 29 mmol/L   Calcium 9.4 8.7 - 10.3 mg/dL   Total Protein 6.9 6.0 - 8.5 g/dL   Albumin 4.2 3.8 - 4.8 g/dL   Globulin, Total 2.7 1.5 - 4.5 g/dL   Albumin/Globulin Ratio 1.6 1.2 - 2.2   Bilirubin Total 0.6 0.0 - 1.2 mg/dL   Alkaline Phosphatase 58 39 - 117 IU/L   AST 18 0 - 40 IU/L   ALT 10 0 - 32 IU/L  CBC with Differential/Platelet  Result Value Ref Range   WBC 5.4 3.4 - 10.8 x10E3/uL   RBC 3.98 3.77 - 5.28 x10E6/uL   Hemoglobin 12.6 11.1 - 15.9 g/dL   Hematocrit 16.137.3 09.634.0 - 46.6 %   MCV 94 79 - 97 fL   MCH 31.7 26.6 - 33.0 pg   MCHC 33.8 31.5 - 35.7 g/dL   RDW 04.514.7 40.911.7 - 81.115.4 %   Platelets 356 150 - 450 x10E3/uL   Neutrophils 54 Not Estab. %   Lymphs 35 Not Estab. %   Monocytes 7 Not Estab. %   Eos 3 Not Estab. %   Basos 1 Not Estab. %   Neutrophils Absolute 2.9 1.4 - 7.0 x10E3/uL   Lymphocytes Absolute 1.9 0.7 - 3.1 x10E3/uL   Monocytes Absolute 0.4 0.1 - 0.9 x10E3/uL   EOS (ABSOLUTE) 0.2 0.0 - 0.4 x10E3/uL   Basophils Absolute 0.1 0.0 - 0.2 x10E3/uL   Immature Granulocytes 0 Not Estab. %   Immature Grans (Abs) 0.0 0.0 - 0.1 x10E3/uL      Assessment & Plan:   Problem List Items Addressed This Visit      Other   Hypercholesteremia - Primary    Chronic, ongoing.  Continue current medication regimen as no ADR reported.  Plan on outpatient labs to check benefit.  Return in 3 months.        Relevant Orders   Lipid Panel w/o Chol/HDL Ratio   Depression, major, single episode, moderate (H[facility])    Chronic, stable.  Continue current medication regimen as reports improvement in mood/sleep, Trazodone 50 MG & Buspar PRN.   Denies SI/HI.  Return in 3 months.         I discussed the assessment and treatment plan with the patient. The patient was provided an opportunity to ask questions and all were answered. The patient agreed with the plan and demonstrated an understanding of the instructions.   The patient was advised to call back or seek an in-person evaluation if the symptoms worsen or if the condition fails to improve as anticipated.   I provided 15 minutes of time during this encounter.  Follow up plan: Return in about 3 months (around 06/04/2019) for HLD/HTN, mood, insomnia, GERD.

## 2019-03-04 NOTE — Assessment & Plan Note (Signed)
Chronic, ongoing.  Continue current medication regimen as no ADR reported.  Plan on outpatient labs to check benefit.  Return in 3 months.

## 2019-03-04 NOTE — Patient Instructions (Signed)
Fat and Cholesterol Restricted Eating Plan Getting too much fat and cholesterol in your diet may cause health problems. Choosing the right foods helps keep your fat and cholesterol at normal levels. This can keep you from getting certain diseases. Your doctor may recommend an eating plan that includes:  Total fat: ______% or less of total calories a day.  Saturated fat: ______% or less of total calories a day.  Cholesterol: less than _________mg a day.  Fiber: ______g a day. What are tips for following this plan? Meal planning  At meals, divide your plate into four equal parts: ? Fill one-half of your plate with vegetables and green salads. ? Fill one-fourth of your plate with whole grains. ? Fill one-fourth of your plate with low-fat (lean) protein foods.  Eat fish that is high in omega-3 fats at least two times a week. This includes mackerel, tuna, sardines, and salmon.  Eat foods that are high in fiber, such as whole grains, beans, apples, broccoli, carrots, peas, and barley. General tips   Work with your doctor to lose weight if you need to.  Avoid: ? Foods with added sugar. ? Fried foods. ? Foods with partially hydrogenated oils.  Limit alcohol intake to no more than 1 drink a day for nonpregnant women and 2 drinks a day for men. One drink equals 12 oz of beer, 5 oz of wine, or 1 oz of hard liquor. Reading food labels  Check food labels for: ? Trans fats. ? Partially hydrogenated oils. ? Saturated fat (g) in each serving. ? Cholesterol (mg) in each serving. ? Fiber (g) in each serving.  Choose foods with healthy fats, such as: ? Monounsaturated fats. ? Polyunsaturated fats. ? Omega-3 fats.  Choose grain products that have whole grains. Look for the word "whole" as the first word in the ingredient list. Cooking  Cook foods using low-fat methods. These include baking, boiling, grilling, and broiling.  Eat more home-cooked foods. Eat at restaurants and buffets  less often.  Avoid cooking using saturated fats, such as butter, cream, palm oil, palm kernel oil, and coconut oil. Recommended foods  Fruits  All fresh, canned (in natural juice), or frozen fruits. Vegetables  Fresh or frozen vegetables (raw, steamed, roasted, or grilled). Green salads. Grains  Whole grains, such as whole wheat or whole grain breads, crackers, cereals, and pasta. Unsweetened oatmeal, bulgur, barley, quinoa, or brown rice. Corn or whole wheat flour tortillas. Meats and other protein foods  Ground beef (85% or leaner), grass-fed beef, or beef trimmed of fat. Skinless chicken or turkey. Ground chicken or turkey. Pork trimmed of fat. All fish and seafood. Egg whites. Dried beans, peas, or lentils. Unsalted nuts or seeds. Unsalted canned beans. Nut butters without added sugar or oil. Dairy  Low-fat or nonfat dairy products, such as skim or 1% milk, 2% or reduced-fat cheeses, low-fat and fat-free ricotta or cottage cheese, or plain low-fat and nonfat yogurt. Fats and oils  Tub margarine without trans fats. Light or reduced-fat mayonnaise and salad dressings. Avocado. Olive, canola, sesame, or safflower oils. The items listed above may not be a complete list of foods and beverages you can eat. Contact a dietitian for more information. Foods to avoid Fruits  Canned fruit in heavy syrup. Fruit in cream or butter sauce. Fried fruit. Vegetables  Vegetables cooked in cheese, cream, or butter sauce. Fried vegetables. Grains  White bread. White pasta. White rice. Cornbread. Bagels, pastries, and croissants. Crackers and snack foods that contain trans fat   and hydrogenated oils. Meats and other protein foods  Fatty cuts of meat. Ribs, chicken wings, bacon, sausage, bologna, salami, chitterlings, fatback, hot dogs, bratwurst, and packaged lunch meats. Liver and organ meats. Whole eggs and egg yolks. Chicken and turkey with skin. Fried meat. Dairy  Whole or 2% milk, cream,  half-and-half, and cream cheese. Whole milk cheeses. Whole-fat or sweetened yogurt. Full-fat cheeses. Nondairy creamers and whipped toppings. Processed cheese, cheese spreads, and cheese curds. Beverages  Alcohol. Sugar-sweetened drinks such as sodas, lemonade, and fruit drinks. Fats and oils  Butter, stick margarine, lard, shortening, ghee, or bacon fat. Coconut, palm kernel, and palm oils. Sweets and desserts  Corn syrup, sugars, honey, and molasses. Candy. Jam and jelly. Syrup. Sweetened cereals. Cookies, pies, cakes, donuts, muffins, and ice cream. The items listed above may not be a complete list of foods and beverages you should avoid. Contact a dietitian for more information. Summary  Choosing the right foods helps keep your fat and cholesterol at normal levels. This can keep you from getting certain diseases.  At meals, fill one-half of your plate with vegetables and green salads.  Eat high-fiber foods, like whole grains, beans, apples, carrots, peas, and barley.  Limit added sugar, saturated fats, alcohol, and fried foods. This information is not intended to replace advice given to you by your health care provider. Make sure you discuss any questions you have with your health care provider. Document Released: 02/06/2012 Document Revised: 04/10/2018 Document Reviewed: 04/24/2017 Elsevier Patient Education  2020 Elsevier Inc.  

## 2019-03-04 NOTE — Assessment & Plan Note (Signed)
Chronic, stable.  Continue current medication regimen as reports improvement in mood/sleep, Trazodone 50 MG & Buspar PRN.  Denies SI/HI.  Return in 3 months.

## 2019-03-07 ENCOUNTER — Other Ambulatory Visit: Payer: Self-pay | Admitting: Nurse Practitioner

## 2019-03-07 ENCOUNTER — Other Ambulatory Visit: Payer: Self-pay

## 2019-03-07 ENCOUNTER — Other Ambulatory Visit: Payer: Medicare Other

## 2019-03-07 DIAGNOSIS — E7800 Pure hypercholesterolemia, unspecified: Secondary | ICD-10-CM

## 2019-03-07 DIAGNOSIS — F321 Major depressive disorder, single episode, moderate: Secondary | ICD-10-CM

## 2019-03-07 DIAGNOSIS — Z1159 Encounter for screening for other viral diseases: Secondary | ICD-10-CM

## 2019-03-07 NOTE — Telephone Encounter (Signed)
Requested Prescriptions  Pending Prescriptions Disp Refills  . traZODone (DESYREL) 50 MG tablet [Pharmacy Med Name: traZODone HCl 50 MG Oral Tablet] 30 tablet 2     Psychiatry: Antidepressants - Serotonin Modulator Passed - 03/07/2019  9:07 AM      Passed - Valid encounter within last 6 months    Recent Outpatient Visits          3 days ago [place], [place] T, NP   1 month ago Depression, major, single episode, moderate ([place])   [place], [place] T, NP   4 months ago Screening for breast cancer   [place], Jolene T, NP   8 months ago Depression, major, single episode, moderate ([place])   [place], Jolene T, NP   9 months ago Depression, major, single episode, moderate ([place])   [place], [place] T, NP      Future Appointments            In 3 months [place]            Passed - Completed PHQ-2 or PHQ-9 in the last 360 days.

## 2019-03-08 LAB — LIPID PANEL W/O CHOL/HDL RATIO
Cholesterol, Total: 157 mg/dL (ref 100–199)
HDL: 56 mg/dL (ref >39)
LDL Calculated: 86 mg/dL (ref 0–99)
Triglycerides: 74 mg/dL (ref 0–149)
VLDL Cholesterol Cal: 15 mg/dL (ref 5–40)

## 2019-03-08 LAB — HEPATITIS C ANTIBODY: Hep C Virus Ab: <0.1 s/co ratio (ref 0.0–0.9)

## 2019-03-29 ENCOUNTER — Other Ambulatory Visit: Payer: Self-pay | Admitting: Nurse Practitioner

## 2019-03-29 NOTE — Telephone Encounter (Signed)
Requested Prescriptions  Pending Prescriptions Disp Refills  . busPIRone (BUSPAR) 5 MG tablet [Pharmacy Med Name: busPIRone HCl 5 MG Oral Tablet] 60 tablet 0    Sig: Take 1 tablet by mouth twice daily as needed for anxiety     Psychiatry: Anxiolytics/Hypnotics - Non-controlled Passed - 03/29/2019 10:35 AM      Passed - Valid encounter within last 6 months    Recent Outpatient Visits          3 weeks ago [place], [place] T, NP   1 month ago Depression, major, single episode, moderate ([place])   [place], [place] T, NP   5 months ago Screening for breast cancer   [place], Jolene T, NP   9 months ago Depression, major, single episode, moderate ([place])   [place], Jolene T, NP   10 months ago Depression, major, single episode, moderate ([place])   [place], Barbaraann Faster, NP      Future Appointments            In 2 months [place]

## 2019-05-02 ENCOUNTER — Other Ambulatory Visit: Payer: Self-pay | Admitting: Nurse Practitioner

## 2019-05-15 ENCOUNTER — Other Ambulatory Visit: Payer: Self-pay | Admitting: Nurse Practitioner

## 2019-06-10 ENCOUNTER — Other Ambulatory Visit: Payer: Self-pay

## 2019-06-10 ENCOUNTER — Encounter: Payer: Self-pay | Admitting: Nurse Practitioner

## 2019-06-10 ENCOUNTER — Ambulatory Visit (INDEPENDENT_AMBULATORY_CARE_PROVIDER_SITE_OTHER): Payer: Medicare Other | Admitting: Nurse Practitioner

## 2019-06-10 VITALS — BP 118/75 | HR 61 | Temp 98.5°F | Wt 173.0 lb

## 2019-06-10 DIAGNOSIS — H9193 Unspecified hearing loss, bilateral: Secondary | ICD-10-CM

## 2019-06-10 DIAGNOSIS — F5101 Primary insomnia: Secondary | ICD-10-CM

## 2019-06-10 DIAGNOSIS — E7800 Pure hypercholesterolemia, unspecified: Secondary | ICD-10-CM | POA: Diagnosis not present

## 2019-06-10 DIAGNOSIS — F411 Generalized anxiety disorder: Secondary | ICD-10-CM

## 2019-06-10 DIAGNOSIS — Z23 Encounter for immunization: Secondary | ICD-10-CM | POA: Diagnosis not present

## 2019-06-10 DIAGNOSIS — F321 Major depressive disorder, single episode, moderate: Secondary | ICD-10-CM

## 2019-06-10 DIAGNOSIS — I10 Essential (primary) hypertension: Secondary | ICD-10-CM

## 2019-06-10 DIAGNOSIS — E669 Obesity, unspecified: Secondary | ICD-10-CM | POA: Insufficient documentation

## 2019-06-10 DIAGNOSIS — Z6830 Body mass index (BMI) 30.0-30.9, adult: Secondary | ICD-10-CM

## 2019-06-10 DIAGNOSIS — E6609 Other obesity due to excess calories: Secondary | ICD-10-CM

## 2019-06-10 MED ORDER — ATORVASTATIN CALCIUM 10 MG PO TABS: 10.0000 mg | ORAL_TABLET | Freq: Every day | ORAL | 3 refills | Status: DC

## 2019-06-10 MED ORDER — TRAZODONE HCL 50 MG PO TABS: 50.0000 mg | ORAL_TABLET | Freq: Every day | ORAL | 2 refills | Status: DC

## 2019-06-10 MED ORDER — BUSPIRONE HCL 5 MG PO TABS: 5.0000 mg | ORAL_TABLET | Freq: Two times a day (BID) | ORAL | 3 refills | Status: DC | PRN

## 2019-06-10 NOTE — Patient Instructions (Addendum)
Please ensure to schedule your mammogram and bone scan at [facility] at [place]: [address], [place], [place] [address]  Phone: [phone]  Influenza (Flu) Vaccine (Inactivated or Recombinant): What You Need to Know 1. Why get vaccinated? Influenza vaccine can prevent influenza (flu). Flu is a contagious disease that spreads around the [place] every year, usually between October and May. Anyone can get the flu, but it is more dangerous for some people. Infants and young children, people 26 years of age and older, pregnant women, and people with certain health conditions or a weakened immune system are at greatest risk of flu complications. Pneumonia, bronchitis, sinus infections and ear infections are examples of flu-related complications. If you have a medical condition, such as heart disease, cancer or diabetes, flu can make it worse. Flu can cause fever and chills, sore throat, muscle aches, fatigue, cough, headache, and runny or stuffy nose. Some people may have vomiting and diarrhea, though this is more common in children than adults. Each year thousands of people in the [place] die from flu, and many more are hospitalized. Flu vaccine prevents millions of illnesses and flu-related visits to the doctor each year. 2. Influenza vaccine CDC recommends everyone 23 months of age and older get vaccinated every flu season. Children 6 months through 65 years of age may need 2 doses during a single flu season. Everyone else needs only 1 dose each flu season. It takes about 2 weeks for protection to develop after vaccination. There are many flu viruses, and they are always changing. Each year a new flu vaccine is made to protect against three or four viruses that are likely to cause disease in the upcoming flu season. Even when the vaccine doesn't exactly match these viruses, it may still provide some protection. Influenza vaccine does not cause  flu. Influenza vaccine may be given at the same time as other vaccines. 3. Talk with your health care provider Tell your vaccine provider if the person getting the vaccine:  Has had an allergic reaction after a previous dose of influenza vaccine, or has any severe, life-threatening allergies.  Has ever had Guillain-Barr Syndrome (also called GBS). In some cases, your health care provider may decide to postpone influenza vaccination to a future visit. People with minor illnesses, such as a cold, may be vaccinated. People who are moderately or severely ill should usually wait until they recover before getting influenza vaccine. Your health care provider can give you more information. 4. Risks of a vaccine reaction  Soreness, redness, and swelling where shot is given, fever, muscle aches, and headache can happen after influenza vaccine.  There may be a very small increased risk of Guillain-Barr Syndrome (GBS) after inactivated influenza vaccine (the flu shot). Young children who get the flu shot along with pneumococcal vaccine (PCV13), and/or DTaP vaccine at the same time might be slightly more likely to have a seizure caused by fever. Tell your health care provider if a child who is getting flu vaccine has ever had a seizure. People sometimes faint after medical procedures, including vaccination. Tell your provider if you feel dizzy or have vision changes or ringing in the ears. As with any medicine, there is a very remote chance of a vaccine causing a severe allergic reaction, other serious injury, or death. 5. What if there is a serious problem? An allergic reaction could occur after the vaccinated person leaves the clinic. If you see signs of a severe  allergic reaction (hives, swelling of the face and throat, difficulty breathing, a fast heartbeat, dizziness, or weakness), call 9-1-1 and get the person to the nearest hospital. For other signs that concern you, call your health care  provider. Adverse reactions should be reported to the Vaccine Adverse Event Reporting System (VAERS). Your health care provider will usually file this report, or you can do it yourself. Visit the VAERS website at www.vaers.LAgents.no or call 864-730-9119.VAERS is only for reporting reactions, and VAERS staff do not give medical advice. 6. The National Vaccine Injury Compensation Program The [employer] Vaccine Injury Compensation Program (VICP) is a federal program that was created to compensate people who may have been injured by certain vaccines. Visit the VICP website at SpiritualWord.at or call 872-464-5206 to learn about the program and about filing a claim. There is a time limit to file a claim for compensation. 7. How can I learn more?  Ask your healthcare provider.  Call your local or state health department.  Contact the Centers for Disease Control and Prevention (CDC): ? Call 510-873-7810 (1-800-CDC-INFO) or ? Visit CDC's BiotechRoom.com.cy Vaccine Information Statement (Interim) Inactivated Influenza Vaccine (04/04/2018) This information is not intended to replace advice given to you by your health care provider. Make sure you discuss any questions you have with your health care provider. Document Released: 06/01/2006 Document Revised: 11/26/2018 Document Reviewed: 04/08/2018 Elsevier Patient Education  2020 [employer].

## 2019-06-10 NOTE — Assessment & Plan Note (Signed)
ENT referral placed for hearing testing.

## 2019-06-10 NOTE — Assessment & Plan Note (Signed)
Chronic, stable on current medication regimen. Denies SI/HI.  Continue Trazodone which benefits sleep and mood.

## 2019-06-10 NOTE — Assessment & Plan Note (Signed)
Praised for weight loss of 15 pounds since March.  Recommend continued focus on health diet choices and regular physical activity (30 minutes 5 days a week).

## 2019-06-10 NOTE — Progress Notes (Signed)
BP 118/75   Pulse 61   Temp 98.5 F (36.9 C) (Oral)   Wt 173 lb (78.5 kg)   LMP 02/01/1991 (Approximate)   SpO2 97%   BMI 30.65 kg/m    Subjective:    Patient ID: Emily Watson, female    DOB: [birth year], 73 y.o.   MRN: [id number]  HPI: Emily Watson is a 73 y.o. female  Chief Complaint  Patient presents with  . Depression  . Hyperlipidemia   HYPERTENSION / HYPERLIPIDEMIA Started on Lipitor 10 MG on June 17th with no ADR and improved numbers last labs.  No medications for BP at this time.  Has been focused on changing diet, with current weight loss from 187 lbs to 173 lbs since March. Satisfied with current treatment? yes Duration of hypertension: chronic BP monitoring frequency: a few times a week BP range: 109/63 this morning Duration of hyperlipidemia: chronic Cholesterol medication side effects: no Cholesterol supplements: none Medication compliance: good compliance Aspirin: yes Recent stressors: no Recurrent headaches: no Visual changes: no Palpitations: no Dyspnea: no Chest pain: no Lower extremity edema: no Dizzy/lightheaded: no   DEPRESSION/ANXIETY Trazodone 50 MG at night.  Continues on Buspar as needed.  Reports she has been driving around to help mood.  She reports a better mood and sleep with increase in medication. Mood status: stable Satisfied with current treatment?: yes Symptom severity: mild  Duration of current treatment : chronic Side effects: no Medication compliance: good compliance Psychotherapy/counseling: none Depressed mood: yes Anxious mood: yes Anhedonia: no Significant weight loss or gain: no Insomnia: none improved with Trazodone Fatigue: no Feelings of worthlessness or guilt: no Impaired concentration/indecisiveness: no Suicidal ideations: no Hopelessness: no Crying spells: no Depression screen [facility] 2/9 06/10/2019 03/04/2019 01/30/2019 10/22/2018 07/01/2018  Decreased Interest 0 0 0 0 0  Down, Depressed, Hopeless 0 1 1 0 0   PHQ - 2 Score 0 1 1 0 0  Altered sleeping 0 0 1 - 0  Tired, decreased energy 0 0 0 - 0  Change in appetite 0 0 0 - 0  Feeling bad or failure about yourself  0 0 0 - 0  Trouble concentrating 0 0 0 - 0  Moving slowly or fidgety/restless 1 0 0 - 0  Suicidal thoughts 0 0 0 - 0  PHQ-9 Score 1 1 2  - 0  Difficult doing work/chores Not difficult at all - Not difficult at all - Not difficult at all  Some recent data might be hidden   GAD 7 : Generalized Anxiety Score 06/10/2019 03/04/2019 01/30/2019 07/01/2018  Nervous, Anxious, on Edge 1 1 1 1   Control/stop worrying 0 1 0 0  Worry too much - different things 0 1 1 0  Trouble relaxing 0 0 0 0  Restless 0 0 0 0  Easily annoyed or irritable 0 1 1 0  Afraid - awful might happen 0 1 1 0  Total GAD 7 Score 1 5 4 1   Anxiety Difficulty Not difficult at all - - Not difficult at all   HEARING LOSS: Requesting hearing testing as has had tinnitus on/off for several years and reports having to turn volume up louder on television.  When worked at [employer] she worked with [employer] and feels this led to some issues with hearing.    Relevant past medical, surgical, family and social history reviewed and updated as indicated. Interim medical history since our last visit reviewed. Allergies and medications reviewed and updated.  Review of Systems  Constitutional:  Negative for activity change, appetite change, diaphoresis, fatigue and fever.  Respiratory: Negative for cough, chest tightness and shortness of breath.   Cardiovascular: Negative for chest pain, palpitations and leg swelling.  Gastrointestinal: Negative for abdominal distention, abdominal pain, constipation, diarrhea, nausea and vomiting.  Endocrine: Negative for cold intolerance, heat intolerance, polydipsia, polyphagia and polyuria.  Neurological: Negative for dizziness, syncope, weakness, light-headedness, numbness and headaches.  Psychiatric/Behavioral: Negative.     Per HPI unless  specifically indicated above     Objective:    BP 118/75   Pulse 61   Temp 98.5 F (36.9 C) (Oral)   Wt 173 lb (78.5 kg)   LMP 02/01/1991 (Approximate)   SpO2 97%   BMI 30.65 kg/m   Wt Readings from Last 3 Encounters:  06/10/19 173 lb (78.5 kg)  10/22/18 187 lb 4 oz (84.9 kg)  07/01/18 186 lb (84.4 kg)    Physical Exam Vitals signs and nursing note reviewed.  Constitutional:      General: She is awake. She is not in acute distress.    Appearance: She is well-developed and overweight. She is not ill-appearing.  HENT:     Head: Normocephalic.     Right Ear: Hearing, tympanic membrane, ear canal and external ear normal.     Left Ear: Hearing, tympanic membrane, ear canal and external ear normal.  Eyes:     General: Lids are normal.        Right eye: No discharge.        Left eye: No discharge.     Conjunctiva/sclera: Conjunctivae normal.     Pupils: Pupils are equal, round, and reactive to light.  Neck:     Musculoskeletal: Normal range of motion and neck supple.     Thyroid: No thyromegaly.     Vascular: No carotid bruit.  Cardiovascular:     Rate and Rhythm: Normal rate and regular rhythm.     Heart sounds: Normal heart sounds. No murmur. No gallop.   Pulmonary:     Effort: Pulmonary effort is normal. No accessory muscle usage or respiratory distress.     Breath sounds: Normal breath sounds.  Abdominal:     General: Bowel sounds are normal.     Palpations: Abdomen is soft. There is no hepatomegaly or splenomegaly.  Musculoskeletal:     Right lower leg: No edema.     Left lower leg: No edema.  Skin:    General: Skin is warm and dry.  Neurological:     Mental Status: She is alert and oriented to person, place, and time.  Psychiatric:        Attention and Perception: Attention normal.        Mood and Affect: Mood normal.        Behavior: Behavior normal. Behavior is cooperative.        Thought Content: Thought content normal.        Judgment: Judgment normal.      Results for orders placed or performed in visit on 03/07/19  Lipid Panel w/o Chol/HDL Ratio  Result Value Ref Range   Cholesterol, Total 157 100 - 199 mg/dL   Triglycerides 74 0 - 149 mg/dL   HDL 56 >49 mg/dL   VLDL Cholesterol Cal 15 5 - 40 mg/dL   LDL Calculated 86 0 - 99 mg/dL  Hepatitis C antibody  Result Value Ref Range   Hep C Virus Ab <0.1 0.0 - 0.9 s/co ratio      Assessment & Plan:  Problem List Items Addressed This Visit      Cardiovascular and Mediastinum   Hypertension - Primary    Chronic, stable with BP at goal today.  Have recommended monitoring BP at home 3 mornings a week and documenting for provider.  Will monitor and initiate medications if consistently elevated.  CMP today.      Relevant Medications   atorvastatin (LIPITOR) 10 MG tablet     Nervous and Auditory   Bilateral hearing loss    ENT referral placed for hearing testing.      Relevant Orders   Ambulatory referral to ENT     Other   Hypercholesteremia    Chronic, ongoing.  Continue current medication regimen and adjust as needed.  Lipid panel and CMP today.  Praised for recent weight loss.      Relevant Medications   atorvastatin (LIPITOR) 10 MG tablet   Other Relevant Orders   Lipid Panel w/o Chol/HDL Ratio   Comprehensive metabolic panel   Generalized anxiety disorder    Chronic, ongoing.  Continue current medication regimen, has benefit from Buspar.        Relevant Medications   traZODone (DESYREL) 50 MG tablet   busPIRone (BUSPAR) 5 MG tablet   Depression, major, single episode, moderate (HCC)    Chronic, stable on current medication regimen. Denies SI/HI.  Continue Trazodone which benefits sleep and mood.      Relevant Medications   traZODone (DESYREL) 50 MG tablet   busPIRone (BUSPAR) 5 MG tablet   Insomnia    Chronic, improved with Trazodone.  Continue current medication which benefits mood and sleep.  Adjust as needed.        Obesity    Praised for weight loss of  15 pounds since March.  Recommend continued focus on health diet choices and regular physical activity (30 minutes 5 days a week).        Other Visit Diagnoses    Need for influenza vaccination       Relevant Orders   Flu Vaccine QUAD High Dose(Fluad) (Completed)       Follow up plan: Return in about 6 months (around 12/09/2019) for Mood, HTN/HLD, GERD.

## 2019-06-10 NOTE — Assessment & Plan Note (Signed)
Chronic, ongoing.  Continue current medication regimen and adjust as needed.  Lipid panel and CMP today.  Praised for recent weight loss.

## 2019-06-10 NOTE — Assessment & Plan Note (Signed)
Chronic, ongoing.  Continue current medication regimen, has benefit from Buspar.

## 2019-06-10 NOTE — Assessment & Plan Note (Addendum)
Chronic, stable with BP at goal today.  Have recommended monitoring BP at home 3 mornings a week and documenting for provider.  Will monitor and initiate medications if consistently elevated.  CMP today.

## 2019-06-10 NOTE — Assessment & Plan Note (Signed)
Chronic, improved with Trazodone.  Continue current medication which benefits mood and sleep.  Adjust as needed.   

## 2019-06-11 LAB — LIPID PANEL W/O CHOL/HDL RATIO
Cholesterol, Total: 171 mg/dL (ref 100–199)
HDL: 54 mg/dL (ref >39)
LDL Chol Calc (NIH): 102 mg/dL — ABNORMAL HIGH (ref 0–99)
Triglycerides: 78 mg/dL (ref 0–149)
VLDL Cholesterol Cal: 15 mg/dL (ref 5–40)

## 2019-06-11 LAB — COMPREHENSIVE METABOLIC PANEL
ALT: 14 IU/L (ref 0–32)
AST: 19 IU/L (ref 0–40)
Albumin/Globulin Ratio: 1.8 (ref 1.2–2.2)
Albumin: 4.5 g/dL (ref 3.7–4.7)
Alkaline Phosphatase: 70 IU/L (ref 39–117)
BUN/Creatinine Ratio: 23 (ref 12–28)
BUN: 16 mg/dL (ref 8–27)
Bilirubin Total: 0.9 mg/dL (ref 0.0–1.2)
CO2: 26 mmol/L (ref 20–29)
Calcium: 9.2 mg/dL (ref 8.7–10.3)
Chloride: 102 mmol/L (ref 96–106)
Creatinine, Ser: 0.7 mg/dL (ref 0.57–1.00)
GFR calc Af Amer: 101 mL/min/{1.73_m2} (ref >59)
GFR calc non Af Amer: 87 mL/min/{1.73_m2} (ref >59)
Globulin, Total: 2.5 g/dL (ref 1.5–4.5)
Glucose: 83 mg/dL (ref 65–99)
Potassium: 4.6 mmol/L (ref 3.5–5.2)
Sodium: 142 mmol/L (ref 134–144)
Total Protein: 7 g/dL (ref 6.0–8.5)

## 2019-06-26 ENCOUNTER — Ambulatory Visit (INDEPENDENT_AMBULATORY_CARE_PROVIDER_SITE_OTHER): Payer: Medicare Other

## 2019-06-26 VITALS — BP 119/59 | HR 53 | Ht 60.0 in | Wt 173.0 lb

## 2019-06-26 DIAGNOSIS — Z780 Asymptomatic menopausal state: Secondary | ICD-10-CM

## 2019-06-26 DIAGNOSIS — Z1231 Encounter for screening mammogram for malignant neoplasm of breast: Secondary | ICD-10-CM

## 2019-06-26 DIAGNOSIS — Z0000 Encounter for general adult medical examination without abnormal findings: Secondary | ICD-10-CM | POA: Diagnosis not present

## 2019-06-26 NOTE — Patient Instructions (Addendum)
[name] , Thank you for taking time to come for your Medicare Wellness Visit. I appreciate your ongoing commitment to your health goals. Please review the following plan we discussed and let me know if I can assist you in the future.   Screening recommendations/referrals: Colonoscopy: declined Mammogram: Please call [phone] to schedule your mammogram.  Bone Density: Please call [phone] to schedule  Recommended yearly ophthalmology/optometry visit for glaucoma screening and checkup Recommended yearly dental visit for hygiene and checkup  Vaccinations: Influenza vaccine: up to date Pneumococcal vaccine: up to date Tdap vaccine: due now Shingles vaccine: shingrix eligible    Advanced directives: please pick up a copy of this paperwork at the office.   Conditions/risks identified: fall prevention discussed below  Next appointment: Follow up in one year for your annual wellness visit.    Preventive Care 25 Years and Older, Female Preventive care refers to lifestyle choices and visits with your health care provider that can promote health and wellness. What does preventive care include?  A yearly physical exam. This is also called an annual well check.  Dental exams once or twice a year.  Routine eye exams. Ask your health care provider how often you should have your eyes checked.  Personal lifestyle choices, including:  Daily care of your teeth and gums.  Regular physical activity.  Eating a healthy diet.  Avoiding tobacco and drug use.  Limiting alcohol use.  Practicing safe sex.  Taking low-dose aspirin every day.  Taking vitamin and mineral supplements as recommended by your health care provider. What happens during an annual well check? The services and screenings done by your health care provider during your annual well check will depend on your age, overall health, lifestyle risk factors, and family history of disease. Counseling  Your health care  provider may ask you questions about your:  Alcohol use.  Tobacco use.  Drug use.  Emotional well-being.  Home and relationship well-being.  Sexual activity.  Eating habits.  History of falls.  Memory and ability to understand (cognition).  Work and work Statistician.  Reproductive health. Screening  You may have the following tests or measurements:  Height, weight, and BMI.  Blood pressure.  Lipid and cholesterol levels. These may be checked every 5 years, or more frequently if you are over 9 years old.  Skin check.  Lung cancer screening. You may have this screening every year starting at age 43 if you have a 30-pack-year history of smoking and currently smoke or have quit within the past 15 years.  Fecal occult blood test (FOBT) of the stool. You may have this test every year starting at age 3.  Flexible sigmoidoscopy or colonoscopy. You may have a sigmoidoscopy every 5 years or a colonoscopy every 10 years starting at age 42.  Hepatitis C blood test.  Hepatitis B blood test.  Sexually transmitted disease (STD) testing.  Diabetes screening. This is done by checking your blood sugar (glucose) after you have not eaten for a while (fasting). You may have this done every 1-3 years.  Bone density scan. This is done to screen for osteoporosis. You may have this done starting at age 66.  Mammogram. This may be done every 1-2 years. Talk to your health care provider about how often you should have regular mammograms. Talk with your health care provider about your test results, treatment options, and if necessary, the need for more tests. Vaccines  Your health care provider may recommend certain vaccines, such as:  Influenza vaccine. This is recommended every year.  Tetanus, diphtheria, and acellular pertussis (Tdap, Td) vaccine. You may need a Td booster every 10 years.  Zoster vaccine. You may need this after age 44.  Pneumococcal 13-valent conjugate (PCV13)  vaccine. One dose is recommended after age 64.  Pneumococcal polysaccharide (PPSV23) vaccine. One dose is recommended after age 59. Talk to your health care provider about which screenings and vaccines you need and how often you need them. This information is not intended to replace advice given to you by your health care provider. Make sure you discuss any questions you have with your health care provider. Document Released: 09/03/2015 Document Revised: 04/26/2016 Document Reviewed: 06/08/2015 Elsevier Interactive Patient Education  2017 [place] Prevention in the Home Falls can cause injuries. They can happen to people of all ages. There are many things you can do to make your home safe and to help prevent falls. What can I do on the outside of my home?  Regularly fix the edges of walkways and driveways and fix any cracks.  Remove anything that might make you trip as you walk through a door, such as a raised step or threshold.  Trim any bushes or trees on the path to your home.  Use bright outdoor lighting.  Clear any walking paths of anything that might make someone trip, such as rocks or tools.  Regularly check to see if handrails are loose or broken. Make sure that both sides of any steps have handrails.  Any raised decks and porches should have guardrails on the edges.  Have any leaves, snow, or ice cleared regularly.  Use sand or salt on walking paths during winter.  Clean up any spills in your garage right away. This includes oil or grease spills. What can I do in the bathroom?  Use night lights.  Install grab bars by the toilet and in the tub and shower. Do not use towel bars as grab bars.  Use non-skid mats or decals in the tub or shower.  If you need to sit down in the shower, use a plastic, non-slip stool.  Keep the floor dry. Clean up any water that spills on the floor as soon as it happens.  Remove soap buildup in the tub or shower  regularly.  Attach bath mats securely with double-sided non-slip rug tape.  Do not have throw rugs and other things on the floor that can make you trip. What can I do in the bedroom?  Use night lights.  Make sure that you have a light by your bed that is easy to reach.  Do not use any sheets or blankets that are too big for your bed. They should not hang down onto the floor.  Have a firm chair that has side arms. You can use this for support while you get dressed.  Do not have throw rugs and other things on the floor that can make you trip. What can I do in the kitchen?  Clean up any spills right away.  Avoid walking on wet floors.  Keep items that you use a lot in easy-to-reach places.  If you need to reach something above you, use a strong step stool that has a grab bar.  Keep electrical cords out of the way.  Do not use floor polish or wax that makes floors slippery. If you must use wax, use non-skid floor wax.  Do not have throw rugs and other things on the floor that  can make you trip. What can I do with my stairs?  Do not leave any items on the stairs.  Make sure that there are handrails on both sides of the stairs and use them. Fix handrails that are broken or loose. Make sure that handrails are as long as the stairways.  Check any carpeting to make sure that it is firmly attached to the stairs. Fix any carpet that is loose or worn.  Avoid having throw rugs at the top or bottom of the stairs. If you do have throw rugs, attach them to the floor with carpet tape.  Make sure that you have a light switch at the top of the stairs and the bottom of the stairs. If you do not have them, ask someone to add them for you. What else can I do to help prevent falls?  Wear shoes that:  Do not have high heels.  Have rubber bottoms.  Are comfortable and fit you well.  Are closed at the toe. Do not wear sandals.  If you use a stepladder:  Make sure that it is fully  opened. Do not climb a closed stepladder.  Make sure that both sides of the stepladder are locked into place.  Ask someone to hold it for you, if possible.  Clearly mark and make sure that you can see:  Any grab bars or handrails.  First and last steps.  Where the edge of each step is.  Use tools that help you move around (mobility aids) if they are needed. These include:  Canes.  Walkers.  Scooters.  Crutches.  Turn on the lights when you go into a dark area. Replace any light bulbs as soon as they burn out.  Set up your furniture so you have a clear path. Avoid moving your furniture around.  If any of your floors are uneven, fix them.  If there are any pets around you, be aware of where they are.  Review your medicines with your doctor. Some medicines can make you feel dizzy. This can increase your chance of falling. Ask your doctor what other things that you can do to help prevent falls. This information is not intended to replace advice given to you by your health care provider. Make sure you discuss any questions you have with your health care provider. Document Released: 06/03/2009 Document Revised: 01/13/2016 Document Reviewed: 09/11/2014 Elsevier Interactive Patient Education  2017 [employer].

## 2019-06-26 NOTE — Progress Notes (Signed)
Subjective:   Emily Watson is a 73 y.o. female who presents for Medicare Annual (Subsequent) preventive examination.  This visit is being conducted via phone call  - after an attmept to do on video chat - due to the COVID-19 pandemic. This patient has given me verbal consent via phone to conduct this visit, patient states they are participating from their home address. Some vital signs may be absent or patient reported.   Patient identification: identified by name, DOB, and current address.    Review of Systems:   Cardiac Risk Factors include: advanced age (>[id number], 34>65 women);dyslipidemia;hypertension     Objective:     Vitals: BP (!) 119/59 Comment: pt reported  Pulse (!) 53 Comment: pt reported  Ht 5' (1.524 m)   Wt 173 lb (78.5 kg) Comment: pt reported  LMP 02/01/1991 (Approximate)   BMI 33.79 kg/m   Body mass index is 33.79 kg/m.  Advanced Directives 06/26/2019 06/18/2017 03/27/2017 03/14/2017  Does Patient Have a Medical Advance Directive? No No No No  Does patient want to make changes to medical advance directive? - Yes (MAU/Ambulatory/Procedural Areas - Information given) - -  Would patient like information on creating a medical advance directive? - - No - Patient declined -    Tobacco Social History   Tobacco Use  Smoking Status Former Smoker  . Quit date: 03/14/1997  . Years since quitting: 22.2  Smokeless Tobacco Never Used     Counseling given: Not Answered   Clinical Intake:  Pre-visit preparation completed: Yes  Pain : 0-10 Pain Score: 0-No pain Pain Location: Leg Pain Orientation: Right Pain Descriptors / Indicators: Aching     Nutritional Status: BMI > 30  Obese Nutritional Risks: None Diabetes: No  How often do you need to have someone help you when you read instructions, pamphlets, or other written materials from your doctor or pharmacy?: 1 - Never     Information entered by :: Keniel Ralston,LPN  Past Medical History:  Diagnosis Date   . Allergy   . Anaphylaxis   . Anxiety   . Arthritis   . Carpal tunnel syndrome   . Depression   . GERD (gastroesophageal reflux disease)   . Hypertension    patient denies  . Peripheral edema   . Trigger thumb of left hand    Past Surgical History:  Procedure Laterality Date  . CARPAL TUNNEL RELEASE    . HERNIA REPAIR  04/2009  . ROTATOR CUFF REPAIR Left   . TOTAL KNEE ARTHROPLASTY Left 03/27/2017   Procedure: TOTAL KNEE ARTHROPLASTY - LEFT;  Surgeon: Kennedy BuckerMenz, Michael, MD;  Location: [facility] ORS;  Service: Orthopedics;  Laterality: Left;   Family History  Problem Relation Age of Onset  . Kidney disease Mother   . Cancer Mother   . Kidney disease Father   . Cancer Father   . Cancer Sister        bone  . Hypertension Sister   . Osteoporosis Sister   . Other Son 41       brain tumor  . Alzheimer's disease Sister   . Hypertension Brother    Social History   Socioeconomic History  . Marital status: Divorced    Spouse name: Not on file  . Number of children: Not on file  . Years of education: Not on file  . Highest education level: High school graduate  Occupational History  . Occupation: retired  Engineer, productionocial Needs  . Financial resource strain: Not on file  .  Food insecurity    Worry: Not on file    Inability: Not on file  . Transportation needs    Medical: Not on file    Non-medical: Not on file  Tobacco Use  . Smoking status: Former Smoker    Quit date: 03/14/1997    Years since quitting: 22.2  . Smokeless tobacco: Never Used  Substance and Sexual Activity  . Alcohol use: No    Alcohol/week: 0.0 standard drinks  . Drug use: No  . Sexual activity: Not on file  Lifestyle  . Physical activity    Days per week: Not on file    Minutes per session: Not on file  . Stress: Not on file  Relationships  . Social Herbalist on phone: Not on file    Gets together: Not on file    Attends religious service: Not on file    Active member of club or organization: Not  on file    Attends meetings of clubs or organizations: Not on file    Relationship status: Not on file  Other Topics Concern  . Not on file  Social History Narrative  . Not on file    Outpatient Encounter Medications as of 06/26/2019  Medication Sig  . aspirin (ASPIRIN EC) 81 MG EC tablet Take 81 mg by mouth daily. Swallow whole.  Marland Kitchen atorvastatin (LIPITOR) 10 MG tablet Take 1 tablet (10 mg total) by mouth daily at 6 PM.  . busPIRone (BUSPAR) 5 MG tablet Take 1 tablet (5 mg total) by mouth 2 (two) times daily as needed. for anxiety  . Multiple Vitamin (MULTIVITAMIN) tablet Take 1 tablet by mouth daily.  Marland Kitchen omeprazole (PRILOSEC OTC) 20 MG tablet Take 1 tablet (20 mg total) by mouth daily.  . traZODone (DESYREL) 50 MG tablet Take 1 tablet (50 mg total) by mouth at bedtime.   No facility-administered encounter medications on file as of 06/26/2019.     Activities of Daily Living In your present state of health, do you have any difficulty performing the following activities: 06/26/2019 10/22/2018  Hearing? Y N  Comment ringing in ear, no hearing aids -  Vision? N N  Comment eyeglasses, goes to [facility] -  Difficulty concentrating or making decisions? Tempie Donning  Comment comes back -  Walking or climbing stairs? Y N  Dressing or bathing? N N  Doing errands, shopping? N N  Preparing Food and eating ? N -  Using the Toilet? N -  In the past six months, have you accidently leaked urine? N -  Do you have problems with loss of bowel control? N -  Managing your Medications? N -  Managing your Finances? N -  Housekeeping or managing your Housekeeping? N -  Some recent data might be hidden    Patient Care Team: Venita Lick, NP as PCP - General (Nurse Practitioner) Hessie Knows, MD as Consulting Physician (Orthopedic Surgery)    Assessment:   This is a routine wellness examination for Emily Watson.  Exercise Activities and Dietary recommendations Current Exercise Habits: Home exercise routine,  Type of exercise: walking, Time (Minutes): 20, Frequency (Times/Week): 3, Weekly Exercise (Minutes/Week): 60, Intensity: Mild, Exercise limited by: None identified  Goals    . DIET - INCREASE WATER INTAKE     Recommend drinking 6-8 glasses of water per day.        Fall Risk: Fall Risk  06/26/2019 10/22/2018 07/09/2018 06/19/2018 05/31/2018  Falls in the past year? 0 0 0  No No  Comment - - Emmi Telephone Survey: data to providers prior to load - -  Number falls in past yr: 0 0 - - -  Injury with Fall? 0 0 - - -    FALL RISK PREVENTION PERTAINING TO THE HOME:  Any stairs in or around the home? Yes  If so, are there any without handrails? No   Home free of loose throw rugs in walkways, pet beds, electrical cords, etc? Yes  Adequate lighting in your home to reduce risk of falls? Yes   ASSISTIVE DEVICES UTILIZED TO PREVENT FALLS:  Life alert? No  Use of a cane, walker or w/c? Yes  cane  Grab bars in the bathroom? No  Shower chair or bench in shower? Yes  Elevated toilet seat or a handicapped toilet? No   DME ORDERS:  DME order needed?  No   TIMED UP AND GO:  Unable to perform   Depression Screen PHQ 2/9 Scores 06/26/2019 06/10/2019 03/04/2019 01/30/2019  PHQ - 2 Score 1 0 1 1  PHQ- 9 Score - Cognitive Function     6CIT Screen 06/19/2018 06/18/2017  What Year? 0 points 0 points  What month? 0 points 0 points  What time? 0 points 0 points  Count back from 20 0 points 0 points  Months in reverse 0 points 0 points  Repeat phrase 0 points 4 points  Total Score 0 4    Immunization History  Administered Date(s) Administered  . Fluad Quad(high Dose 65+) 06/10/2019  . Influenza, High Dose Seasonal PF 06/08/2016, 05/21/2017, 04/29/2018  . Influenza,inj,Quad PF,6+ Mos 06/04/2015  . Pneumococcal Conjugate-13 07/20/2014  . Pneumococcal Polysaccharide-23 08/03/2015  . Td 08/21/2005    Qualifies for Shingles Vaccine? Yes  Zostavax completed n/a. Due for  Shingrix. Education has been provided regarding the importance of this vaccine. Pt has been advised to call insurance company to determine out of pocket expense. Advised may also receive vaccine at local pharmacy or Health Dept. Verbalized acceptance and understanding.  Tdap: Discussed need for TD/TDAP vaccine, patient verbalized understanding that this is not covered as a preventative with there insurance and to call the office if she develops any new skin injuries, ie: cuts, scrapes, bug bites, or open wounds.  Flu Vaccine: UTD  Pneumococcal Vaccine: UTD  Screening Tests Health Maintenance  Topic Date Due  . DEXA SCAN  05/07/2013  . MAMMOGRAM  02/11/2018  . COLONOSCOPY  10/22/2019 (Originally 05/07/1998)  . TETANUS/TDAP  06/25/2020 (Originally 08/22/2015)  . INFLUENZA VACCINE  Completed  . Hepatitis C Screening  Completed  . PNA vac Low Risk Adult  Completed    Cancer Screenings:  Colorectal Screening: declined all screening   Mammogram: ordered   Bone Density: ordered  Lung Cancer Screening: (Low Dose CT Chest recommended if Age 50-80 years, 30 pack-year currently smoking OR have quit w/in 15years.) does not qualify.     Additional Screening:  Hepatitis C Screening: does qualify; Completed 02/2019  Vision Screening: Recommended annual ophthalmology exams for early detection of glaucoma and other disorders of the eye. Is the patient up to date with their annual eye exam?  Yes  Who is the provider or what is the name of the office in which the pt attends annual eye exams? [facility]    Dental Screening: Recommended annual dental exams for proper oral hygiene  Community Resource Referral:  CRR required this visit?  Yes, life alert information  Plan:  I have personally reviewed and addressed the Medicare Annual Wellness questionnaire and have noted the following in the patient's chart:  A. Medical and social history B. Use of alcohol, tobacco or illicit drugs  C.  Current medications and supplements D. Functional ability and status E.  Nutritional status F.  Physical activity G. Advance directives H. List of other physicians I.  Hospitalizations, surgeries, and ER visits in previous 12 months  J.  Vitals K. Screenings such as hearing and vision if needed, cognitive and depression L. Referrals and appointments   In addition, I have reviewed and discussed with patient certain preventive protocols, quality metrics, and best practice recommendations. A written personalized care plan for preventive services as well as general preventive health recommendations were provided to patient.  Signed,    Collene Schlichter, LPN  42/02/622 Nurse Health Advisor   Nurse Notes: none

## 2019-10-11 ENCOUNTER — Other Ambulatory Visit: Payer: Self-pay | Admitting: Nurse Practitioner

## 2019-10-11 NOTE — Telephone Encounter (Signed)
Requested Prescriptions  Pending Prescriptions Disp Refills  . busPIRone (BUSPAR) 5 MG tablet [Pharmacy Med Name: busPIRone HCl 5 MG Oral Tablet] 60 tablet 0    Sig: Take 1 tablet by mouth twice daily as needed for anxiety     Psychiatry: Anxiolytics/Hypnotics - Non-controlled Passed - 10/11/2019  9:54 AM      Passed - Valid encounter within last 6 months    Recent Outpatient Visits          4 months ago Essential hypertension   [facility] [place], Dorie Rank, NP   7 months ago Hypercholesteremia   [facility] [place], [place] T, NP   8 months ago Depression, major, single episode, moderate (HCC)   [facility] Cannady, Jolene T, NP   11 months ago Screening for breast cancer   [facility] [place], Corrie Dandy T, NP   1 year ago Depression, major, single episode, moderate (HCC)   [facility] [place], Dorie Rank, NP      Future Appointments            In 1 month Cannady, Dorie Rank, NP [facility]   In 8 months  [facility]

## 2019-11-04 ENCOUNTER — Ambulatory Visit
Admission: RE | Admit: 2019-11-04 | Discharge: 2019-11-04 | Disposition: A | Discharge status: Home / Self Care | Payer: Medicare HMO | Source: Ambulatory Visit | Attending: Nurse Practitioner | Admitting: Nurse Practitioner

## 2019-11-04 ENCOUNTER — Encounter: Payer: Self-pay | Admitting: Nurse Practitioner

## 2019-11-04 DIAGNOSIS — Z1231 Encounter for screening mammogram for malignant neoplasm of breast: Secondary | ICD-10-CM

## 2019-11-04 DIAGNOSIS — Z780 Asymptomatic menopausal state: Secondary | ICD-10-CM | POA: Diagnosis not present

## 2019-11-04 DIAGNOSIS — M810 Age-related osteoporosis without current pathological fracture: Secondary | ICD-10-CM | POA: Insufficient documentation

## 2019-11-04 NOTE — Progress Notes (Signed)
Contacted via MyChart

## 2019-11-08 ENCOUNTER — Other Ambulatory Visit: Payer: Self-pay | Admitting: Nurse Practitioner

## 2019-11-08 NOTE — Telephone Encounter (Signed)
Requested Prescriptions  Pending Prescriptions Disp Refills  . busPIRone (BUSPAR) 5 MG tablet [Pharmacy Med Name: busPIRone HCl 5 MG Oral Tablet] 60 tablet 0    Sig: Take 1 tablet by mouth twice daily as needed for anxiety     Psychiatry: Anxiolytics/Hypnotics - Non-controlled Passed - 11/08/2019  9:27 AM      Passed - Valid encounter within last 6 months    Recent Outpatient Visits          5 months ago Essential hypertension   [facility] [place], Dorie Rank, NP   8 months ago Hypercholesteremia   [facility] [place], [place] T, NP   9 months ago Depression, major, single episode, moderate (HCC)   [facility] [place], [place] T, NP   1 year ago Screening for breast cancer   [facility] [place], Corrie Dandy T, NP   1 year ago Depression, major, single episode, moderate (HCC)   [facility] Cannady, Dorie Rank, NP      Future Appointments            In 1 month Cannady, Dorie Rank, NP [facility]   In 7 months  [facility]

## 2019-12-09 ENCOUNTER — Encounter: Payer: Self-pay | Admitting: Nurse Practitioner

## 2019-12-09 ENCOUNTER — Ambulatory Visit (INDEPENDENT_AMBULATORY_CARE_PROVIDER_SITE_OTHER): Payer: Medicare Other | Admitting: Nurse Practitioner

## 2019-12-09 ENCOUNTER — Other Ambulatory Visit: Payer: Self-pay

## 2019-12-09 VITALS — BP 119/72 | HR 55 | Temp 98.1°F | Wt 163.0 lb

## 2019-12-09 DIAGNOSIS — F5101 Primary insomnia: Secondary | ICD-10-CM | POA: Diagnosis not present

## 2019-12-09 DIAGNOSIS — I10 Essential (primary) hypertension: Secondary | ICD-10-CM

## 2019-12-09 DIAGNOSIS — M25512 Pain in left shoulder: Secondary | ICD-10-CM

## 2019-12-09 DIAGNOSIS — F411 Generalized anxiety disorder: Secondary | ICD-10-CM | POA: Diagnosis not present

## 2019-12-09 DIAGNOSIS — F321 Major depressive disorder, single episode, moderate: Secondary | ICD-10-CM | POA: Diagnosis not present

## 2019-12-09 DIAGNOSIS — M7918 Myalgia, other site: Secondary | ICD-10-CM | POA: Insufficient documentation

## 2019-12-09 DIAGNOSIS — M810 Age-related osteoporosis without current pathological fracture: Secondary | ICD-10-CM

## 2019-12-09 DIAGNOSIS — E6609 Other obesity due to excess calories: Secondary | ICD-10-CM

## 2019-12-09 DIAGNOSIS — Z6831 Body mass index (BMI) 31.0-31.9, adult: Secondary | ICD-10-CM

## 2019-12-09 DIAGNOSIS — K219 Gastro-esophageal reflux disease without esophagitis: Secondary | ICD-10-CM

## 2019-12-09 DIAGNOSIS — E7800 Pure hypercholesterolemia, unspecified: Secondary | ICD-10-CM

## 2019-12-09 MED ORDER — ALENDRONATE SODIUM 70 MG PO TABS: 70.0000 mg | ORAL_TABLET | ORAL | 11 refills | Status: DC

## 2019-12-09 NOTE — Patient Instructions (Signed)
Osteoporosis  Osteoporosis happens when your bones get thin and weak. This can cause your bones to break (fracture) more easily. You can do things at home to make your bones stronger. Follow these instructions at home:  Activity  Exercise as told by your doctor. Ask your doctor what activities are safe for you. You should do: ? Exercises that make your muscles work to hold your body weight up (weight-bearing exercises). These include tai chi, yoga, and walking. ? Exercises to make your muscles stronger. One example is lifting weights. Lifestyle  Limit alcohol intake to no more than 1 drink a day for nonpregnant women and 2 drinks a day for men. One drink equals 12 oz of beer, 5 oz of wine, or 1 oz of hard liquor.  Do not use any products that have nicotine or tobacco in them. These include cigarettes and e-cigarettes. If you need help quitting, ask your doctor. Preventing falls  Use tools to help you move around (mobility aids) as needed. These include canes, walkers, scooters, and crutches.  Keep rooms well-lit and free of clutter.  Put away things that could make you trip. These include cords and rugs.  Install safety rails on stairs. Install grab bars in bathrooms.  Use rubber mats in slippery areas, like bathrooms.  Wear shoes that: ? Fit you well. ? Support your feet. ? Have closed toes. ? Have rubber soles or low heels.  Tell your doctor about all of the medicines you are taking. Some medicines can make you more likely to fall. General instructions  Eat plenty of calcium and vitamin D. These nutrients are good for your bones. Good sources of calcium and vitamin D include: ? Some fatty fish, such as salmon and tuna. ? Foods that have calcium and vitamin D added to them (fortified foods). For example, some breakfast cereals are fortified with calcium and vitamin D. ? Egg yolks. ? Cheese. ? Liver.  Take over-the-counter and prescription medicines only as told by your  doctor.  Keep all follow-up visits as told by your doctor. This is important. Contact a doctor if:  You have not been tested (screened) for osteoporosis and you are: ? A woman who is age 19 or older. ? A man who is age 5 or older. Get help right away if:  You fall.  You get hurt. Summary  Osteoporosis happens when your bones get thin and weak.  Weak bones can break (fracture) more easily.  Eat plenty of calcium and vitamin D. These nutrients are good for your bones.  Tell your doctor about all of the medicines that you take. This information is not intended to replace advice given to you by your health care provider. Make sure you discuss any questions you have with your health care provider. Document Revised: 07/20/2017 Document Reviewed: 06/01/2017 Elsevier Patient Education  [place]. Alendronate weekly tablets What is this medicine? ALENDRONATE (a LEN droe nate) slows calcium loss from bones. It helps to make healthy bone and to slow bone loss in people with osteoporosis. It may be used to treat Paget's disease. This medicine may be used for other purposes; ask your health care provider or pharmacist if you have questions. COMMON BRAND NAME(S): Fosamax What should I tell my health care provider before I take this medicine? They need to know if you have any of these conditions:  esophagus, stomach, or intestine problems, like acid-reflux or GERD  dental disease  kidney disease  low blood calcium  low  vitamin D  problems swallowing  problems sitting or standing for 30 minutes  an unusual or allergic reaction to alendronate, other medicines, foods, dyes, or preservatives  pregnant or trying to get pregnant  breast-feeding How should I use this medicine? You must take this medicine exactly as directed or you will lower the amount of medicine you absorb into your body or you may cause yourself harm. Take your dose by mouth first thing in the morning,  after you are up for the day. Do not eat or drink anything before you take this medicine. Swallow your medicine with a full glass (6 to 8 fluid ounces) of plain water. Do not take this tablet with any other drink. Do not chew or crush the tablet. After taking this medicine, do not eat breakfast, drink, or take any medicines or vitamins for at least 30 minutes. Stand or sit up for at least 30 minutes after you take this medicine; do not lie down. Take this medicine on the same day every week. Do not take your medicine more often than directed. Talk to your pediatrician regarding the use of this medicine in children. Special care may be needed. Overdosage: If you think you have taken too much of this medicine contact a poison control center or emergency room at once. NOTE: This medicine is only for you. Do not share this medicine with others. What if I miss a dose? If you miss a dose, take the dose on the morning after you remember. Then take your next dose on your regular day of the week. Never take 2 tablets on the same day. Do not take double or extra doses. What may interact with this medicine?  aluminum hydroxide  antacids  aspirin  calcium supplements  drugs for inflammation like ibuprofen, naproxen, and others  iron supplements  magnesium supplements  vitamins with minerals This list may not describe all possible interactions. Give your health care provider a list of all the medicines, herbs, non-prescription drugs, or dietary supplements you use. Also tell them if you smoke, drink alcohol, or use illegal drugs. Some items may interact with your medicine. What should I watch for while using this medicine? Visit your doctor or health care professional for regular checks ups. It may be some time before you see benefit from this medicine. Do not stop taking your medication except on your doctor's advice. Your doctor or health care professional may order blood tests and other tests to see  how you are doing. You should make sure you get enough calcium and vitamin D while you are taking this medicine, unless your doctor tells you not to. Discuss the foods you eat and the vitamins you take with your health care professional. Some people who take this medicine have severe bone, joint, and/or muscle pain. This medicine may also increase your risk for a broken thigh bone. Tell your doctor right away if you have pain in your upper leg or groin. Tell your doctor if you have any pain that does not go away or that gets worse. This medicine can make you more sensitive to the sun. If you get a rash while taking this medicine, sunlight may cause the rash to get worse. Keep out of the sun. If you cannot avoid being in the sun, wear protective clothing and use sunscreen. Do not use sun lamps or tanning beds/booths. What side effects may I notice from receiving this medicine? Side effects that you should report to your doctor or health  care professional as soon as possible:  allergic reactions like skin rash, itching or hives, swelling of the face, lips, or tongue  black or tarry stools  bone, muscle or joint pain  changes in vision  chest pain  heartburn or stomach pain  jaw pain, especially after dental work  pain or trouble when swallowing  redness, blistering, peeling or loosening of the skin, including inside the mouth Side effects that usually do not require medical attention (report to your doctor or health care professional if they continue or are bothersome):  changes in taste  diarrhea or constipation  eye pain or itching  headache  nausea or vomiting  stomach gas or fullness This list may not describe all possible side effects. Call your doctor for medical advice about side effects. You may report side effects to FDA at 1-800-FDA-1088. Where should I keep my medicine? Keep out of the reach of children. Store at room temperature of 15 and 30 degrees C (59 and 86  degrees F). Throw away any unused medicine after the expiration date. NOTE: This sheet is a summary. It may not cover all possible information. If you have questions about this medicine, talk to your doctor, pharmacist, or health care provider.  2020 Elsevier/Gold Standard (2011-06-22 09:02:42)

## 2019-12-09 NOTE — Assessment & Plan Note (Signed)
Chronic, stable on current medication regimen. Denies SI/HI.  Continue Trazodone which benefits sleep and mood.  Adjust regimen as needed based on mood and scores.

## 2019-12-09 NOTE — Assessment & Plan Note (Signed)
Chronic, ongoing.  Continue current medication regimen and adjust as needed.  Lipid panel today.  Praised for ongoing weight loss. 

## 2019-12-09 NOTE — Assessment & Plan Note (Signed)
New diagnosis, noted on DEXA.  Educated her on this diagnosis and treatment options, side effects, risks/benefits.  She is taking daily Calcium and Vit D3, to continue this.  She would like to try Fosamax, educated her at length on this and provided information to her.  Script sent for weekly Fosamax.  Repeat DEXA in 2023.  Monitor for fractures or falls.

## 2019-12-09 NOTE — Assessment & Plan Note (Signed)
Chronic, stable with Prilosec.  Denies any symptoms at this time and no use of TUMS.  Continue current medication regimen and adjust as needed.  Mag level today. 

## 2019-12-09 NOTE — Assessment & Plan Note (Signed)
Chronic, ongoing.  Continue current medication regimen, has benefit from Buspar as needed.  Uses minimally.  Adjust regimen as needed.

## 2019-12-09 NOTE — Assessment & Plan Note (Signed)
Chronic, improved with Trazodone.  Continue current medication which benefits mood and sleep.  Adjust as needed.

## 2019-12-09 NOTE — Assessment & Plan Note (Signed)
History of surgeries to this shoulder, rotator cuff.  Suspect some arthritis present.  She does not wish to attend PT due to cost, although would be beneficial for pain and ROM.  At this time recommend PT exercises at home and take Tylenol as needed.  Educated on Tylenol and max daily dose.  May also use Icy/Hot or Voltaren gel as needed.  Continue to use ice for discomfort.  Return to office for worsening or ongoing pain.  If worsening obtain imaging and recommend PT or ortho visit.

## 2019-12-09 NOTE — Assessment & Plan Note (Signed)
Chronic, stable with BP at goal today.  Continue monitoring BP at home 3 mornings a week and documenting for provider.  Will monitor and initiate medications if consistently elevated.  BMP today.

## 2019-12-09 NOTE — Assessment & Plan Note (Signed)
Recommended eating smaller high protein, low fat meals more frequently and exercising 30 mins a day 5 times a week with a goal of 10-15lb weight loss in the next 3 months. Patient voiced their understanding and motivation to adhere to these recommendations.  

## 2019-12-09 NOTE — Progress Notes (Signed)
BP 119/72   Pulse (!) 55   Temp 98.1 F (36.7 C) (Oral)   Wt 163 lb (73.9 kg)   LMP 02/01/1991 (Approximate)   SpO2 97%   BMI 31.83 kg/m    Subjective:    Patient ID: Emily Watson, female    DOB: June 12, 1946, 74 y.o.   MRN: [id number]  HPI: Emily Watson is a 74 y.o. female  Chief Complaint  Patient presents with  . Anxiety  . Hypertension  . Hyperlipidemia  . Gastroesophageal Reflux   HYPERTENSION / HYPERLIPIDEMIA Continues on Lipitor for cholesterol, is tolerating without ADR.  No current BP medications.  Has been focused on diet and weight loss.  Has lost 10 pounds since last visit. Satisfied with current treatment? yes Duration of hypertension: chronic BP monitoring frequency: a couple times a week BP range: 110/60 range at home Duration of hyperlipidemia: chronic Cholesterol medication side effects: no Cholesterol supplements: none Medication compliance: good compliance Aspirin: yes Recent stressors: no Recurrent headaches: no Visual changes: no Palpitations: no Dyspnea: no Chest pain: no Lower extremity edema: no Dizzy/lightheaded: no   OSTEOPOROSIS Last DEXA scan was March 2021 -- did note osteoporosis with T Score -3.0.  No current medications.  Discussed medications for treatment, she would like to try Fosamax. Educated on this medication and side effects, aware to call if increased heart burn noted.  She is taking daily supplements, Vit D and Calcium. Satisfied with current treatment?: yes Medication side effects: no Medication compliance: good compliance Past osteoporosis medications/treatments:  Adequate calcium & vitamin D: yes Intolerance to bisphosphonates:no Weight bearing exercises: yes  DEPRESSION/ANXIETY Continues on Trazodone 50 MG at night + Buspar 5 MG as needed. Reports she has been going for walks with her daughter and grand kids, which relieves stress.  Mood status: stable Satisfied with current treatment?: yes Symptom  severity: mild  Duration of current treatment : chronic Side effects: no Medication compliance: good compliance Psychotherapy/counseling: none Depressed mood: yes Anxious mood: yes Anhedonia: no Significant weight loss or gain: no Insomnia: none improved with Trazodone Fatigue: no Feelings of worthlessness or guilt: no Impaired concentration/indecisiveness: no Suicidal ideations: no Hopelessness: no Crying spells: no Depression screen [facility] 2/9 12/09/2019 06/26/2019 06/10/2019 03/04/2019 01/30/2019  Decreased Interest 0 0 0 0 0  Down, Depressed, Hopeless 0 1 0 1 1  PHQ - 2 Score 0 1 0 1 1  Altered sleeping 0 - 0 0 1  Tired, decreased energy 0 - 0 0 0  Change in appetite 0 - 0 0 0  Feeling bad or failure about yourself  0 - 0 0 0  Trouble concentrating 0 - 0 0 0  Moving slowly or fidgety/restless 0 - 1 0 0  Suicidal thoughts 0 - 0 0 0  PHQ-9 Score 0 - 1 1 2   Difficult doing work/chores Not difficult at all - Not difficult at all - Not difficult at all  Some recent data might be hidden   GAD 7 : Generalized Anxiety Score 12/09/2019 06/10/2019 03/04/2019 01/30/2019  Nervous, Anxious, on Edge 1 1 1 1   Control/stop worrying 1 0 1 0  Worry too much - different things 0 0 1 1  Trouble relaxing 1 0 0 0  Restless 0 0 0 0  Easily annoyed or irritable 0 0 1 1  Afraid - awful might happen 0 0 1 1  Total GAD 7 Score 3 1 5 4   Anxiety Difficulty Not difficult at all Not difficult at all - -  GERD Continues on Prilosec 20 MG daily.  She reports no heart burn and being well controlled with this. GERD control status: stable  Satisfied with current treatment? yes Heartburn frequency: none Medication side effects: no  Medication compliance: stable Previous GERD medications: Antacid use frequency:  none Dysphagia: no Odynophagia:  no Hematemesis: no Blood in stool: no EGD: no   SHOULDER PAIN Started in January to left shoulder.  Notices discomfort to deltoid area with extension and  flexion.  Has history of rotator cuff surgery to this shoulder, she believes this was in 2014. Duration: months Involved shoulder: left Mechanism of injury: unknown Location: anterior Onset:gradual Severity: moderate  Quality:  dull and aching Frequency: intermittent Radiation: no Aggravating factors: lifting, movement and sleep  Alleviating factors: ice  Status: fluctuating Treatments attempted: ice  Relief with NSAIDs?:  No NSAIDs Taken Weakness: no Numbness: no Decreased grip strength: no Redness: no Swelling: no Bruising: no Fevers: no   Relevant past medical, surgical, family and social history reviewed and updated as indicated. Interim medical history since our last visit reviewed. Allergies and medications reviewed and updated.  Review of Systems  Constitutional: Negative for activity change, appetite change, diaphoresis, fatigue and fever.  Respiratory: Negative for cough, chest tightness and shortness of breath.   Cardiovascular: Negative for chest pain, palpitations and leg swelling.  Gastrointestinal: Negative.   Musculoskeletal: Positive for arthralgias.  Neurological: Negative.   Psychiatric/Behavioral: Negative for decreased concentration, self-injury, sleep disturbance and suicidal ideas. The patient is nervous/anxious.     Per HPI unless specifically indicated above     Objective:    BP 119/72   Pulse (!) 55   Temp 98.1 F (36.7 C) (Oral)   Wt 163 lb (73.9 kg)   LMP 02/01/1991 (Approximate)   SpO2 97%   BMI 31.83 kg/m   Wt Readings from Last 3 Encounters:  12/09/19 163 lb (73.9 kg)  06/26/19 173 lb (78.5 kg)  06/10/19 173 lb (78.5 kg)    Physical Exam Vitals and nursing note reviewed.  Constitutional:      General: She is awake. She is not in acute distress.    Appearance: She is well-developed. She is obese. She is not ill-appearing.  HENT:     Head: Normocephalic.     Right Ear: Hearing normal.     Left Ear: Hearing normal.  Eyes:      General: Lids are normal.        Right eye: No discharge.        Left eye: No discharge.     Conjunctiva/sclera: Conjunctivae normal.     Pupils: Pupils are equal, round, and reactive to light.  Neck:     Thyroid: No thyromegaly.     Vascular: No carotid bruit.  Cardiovascular:     Rate and Rhythm: Normal rate and regular rhythm.     Heart sounds: Normal heart sounds. No murmur. No gallop.   Pulmonary:     Effort: Pulmonary effort is normal. No accessory muscle usage or respiratory distress.     Breath sounds: Normal breath sounds.  Abdominal:     General: Bowel sounds are normal.     Palpations: Abdomen is soft.  Musculoskeletal:     Right shoulder: Normal.     Left shoulder: Tenderness (posterior aspect on palpation) present. No swelling, effusion, laceration, bony tenderness or crepitus. Decreased range of motion. Normal strength.     Cervical back: Normal range of motion and neck supple.     Right  lower leg: No edema.     Left lower leg: No edema.  Skin:    General: Skin is warm and dry.  Neurological:     Mental Status: She is alert and oriented to person, place, and time.  Psychiatric:        Attention and Perception: Attention normal.        Mood and Affect: Mood normal.        Speech: Speech normal.        Behavior: Behavior normal. Behavior is cooperative.        Thought Content: Thought content normal.       Shoulder: bilateral    Inspection:  no swelling, ecchymosis, erythema or step off deformity.  Swelling: no none  Ecchymosis: no none  Erythema: no none  Deformity: none      Tenderness to Palpation:    Acromion: no    AC joint:no    Clavicle: no    Bicipital groove: no    Scapular spine: no    Coracoid process: no    Humeral head: no    Supraspinatus tendon: mild tenderness left side     Range of Motion: Decreased ROM left shoulder    Abduction:Normal    Adduction: Normal    Flexion: Decreased    Extension: Decreased    Internal rotation:  Decreased    External rotation: Decreased    Painful arc: no     Muscle Strength: 5/5 bilaterally    Flexion: Normal8    Extension: Normal    Abduction: Normal    Adduction: Normal    External rotation: Normal    Internal rotation: Normal     Neuro: Sensation WNL. and Upper extremity reflexes WNL.     Special Tests:     Neer sign: Negative    Hawkins sign: Negative    Cross arm adduction: Negative    Yergason sign: Negative   Results for orders placed or performed in visit on 06/10/19  Lipid Panel w/o Chol/HDL Ratio  Result Value Ref Range   Cholesterol, Total 171 100 - 199 mg/dL   Triglycerides 78 0 - 149 mg/dL   HDL 54 >61 mg/dL   VLDL Cholesterol Cal 15 5 - 40 mg/dL   LDL Chol Calc (NIH) 607 (H) 0 - 99 mg/dL  Comprehensive metabolic panel  Result Value Ref Range   Glucose 83 65 - 99 mg/dL   BUN 16 8 - 27 mg/dL   Creatinine, Ser 3.71 0.57 - 1.00 mg/dL   GFR calc non Af Amer 87 >59 mL/min/1.73   GFR calc Af Amer 101 >59 mL/min/1.73   BUN/Creatinine Ratio 23 12 - 28   Sodium 142 134 - 144 mmol/L   Potassium 4.6 3.5 - 5.2 mmol/L   Chloride 102 96 - 106 mmol/L   CO2 26 20 - 29 mmol/L   Calcium 9.2 8.7 - 10.3 mg/dL   Total Protein 7.0 6.0 - 8.5 g/dL   Albumin 4.5 3.7 - 4.7 g/dL   Globulin, Total 2.5 1.5 - 4.5 g/dL   Albumin/Globulin Ratio 1.8 1.2 - 2.2   Bilirubin Total 0.9 0.0 - 1.2 mg/dL   Alkaline Phosphatase 70 39 - 117 IU/L   AST 19 0 - 40 IU/L   ALT 14 0 - 32 IU/L      Assessment & Plan:   Problem List Items Addressed This Visit      Cardiovascular and Mediastinum   Hypertension    Chronic, stable with BP at  goal today.  Continue monitoring BP at home 3 mornings a week and documenting for provider.  Will monitor and initiate medications if consistently elevated.  BMP today.      Relevant Orders   Basic metabolic panel     Digestive   GERD (gastroesophageal reflux disease)    Chronic, stable with Prilosec.  Denies any symptoms at this time and no  use of TUMS.  Continue current medication regimen and adjust as needed.  Mag level today.      Relevant Orders   Magnesium     Musculoskeletal and Integument   Osteoporosis    New diagnosis, noted on DEXA.  Educated her on this diagnosis and treatment options, side effects, risks/benefits.  She is taking daily Calcium and Vit D3, to continue this.  She would like to try Fosamax, educated her at length on this and provided information to her.  Script sent for weekly Fosamax.  Repeat DEXA in 2023.  Monitor for fractures or falls.      Relevant Medications   Cholecalciferol (VITAMIN D3 PO)   alendronate (FOSAMAX) 70 MG tablet   Other Relevant Orders   VITAMIN D 25 Hydroxy (Vit-D Deficiency, Fractures)     Other   Hypercholesteremia    Chronic, ongoing.  Continue current medication regimen and adjust as needed.  Lipid panel today.  Praised for ongoing weight loss.      Relevant Orders   Lipid Panel w/o Chol/HDL Ratio   Generalized anxiety disorder    Chronic, ongoing.  Continue current medication regimen, has benefit from Buspar as needed.  Uses minimally.  Adjust regimen as needed.      Depression, major, single episode, moderate (HCC) - Primary    Chronic, stable on current medication regimen. Denies SI/HI.  Continue Trazodone which benefits sleep and mood.  Adjust regimen as needed based on mood and scores.      Insomnia    Chronic, improved with Trazodone.  Continue current medication which benefits mood and sleep.  Adjust as needed.        Obesity    Recommended eating smaller high protein, low fat meals more frequently and exercising 30 mins a day 5 times a week with a goal of 10-15lb weight loss in the next 3 months. Patient voiced their understanding and motivation to adhere to these recommendations.       Left shoulder pain    History of surgeries to this shoulder, rotator cuff.  Suspect some arthritis present.  She does not wish to attend PT due to cost, although would  be beneficial for pain and ROM.  At this time recommend PT exercises at home and take Tylenol as needed.  Educated on Tylenol and max daily dose.  May also use Icy/Hot or Voltaren gel as needed.  Continue to use ice for discomfort.  Return to office for worsening or ongoing pain.  If worsening obtain imaging and recommend PT or ortho visit.          Follow up plan: Return in about 6 months (around 06/09/2020) for Annual physical.

## 2019-12-10 ENCOUNTER — Other Ambulatory Visit: Payer: Self-pay | Admitting: Nurse Practitioner

## 2019-12-10 LAB — BASIC METABOLIC PANEL WITH GFR
BUN/Creatinine Ratio: 24 (ref 12–28)
BUN: 17 mg/dL (ref 8–27)
CO2: 26 mmol/L (ref 20–29)
Calcium: 8.9 mg/dL (ref 8.7–10.3)
Chloride: 101 mmol/L (ref 96–106)
Creatinine, Ser: 0.71 mg/dL (ref 0.57–1.00)
GFR calc Af Amer: 99 mL/min/1.73
GFR calc non Af Amer: 86 mL/min/1.73
Glucose: 58 mg/dL — ABNORMAL LOW (ref 65–99)
Potassium: 3.9 mmol/L (ref 3.5–5.2)
Sodium: 143 mmol/L (ref 134–144)

## 2019-12-10 LAB — LIPID PANEL W/O CHOL/HDL RATIO
Cholesterol, Total: 160 mg/dL (ref 100–199)
HDL: 56 mg/dL (ref >39)
LDL Chol Calc (NIH): 89 mg/dL (ref 0–99)
Triglycerides: 79 mg/dL (ref 0–149)
VLDL Cholesterol Cal: 15 mg/dL (ref 5–40)

## 2019-12-10 LAB — VITAMIN D 25 HYDROXY (VIT D DEFICIENCY, FRACTURES): Vit D, 25-Hydroxy: 31.1 ng/mL (ref 30.0–100.0)

## 2019-12-10 LAB — MAGNESIUM: Magnesium: 2.2 mg/dL (ref 1.6–2.3)

## 2019-12-12 ENCOUNTER — Telehealth: Payer: Self-pay | Admitting: Nurse Practitioner

## 2019-12-12 NOTE — Telephone Encounter (Signed)
Copied from CRM [id number]. Topic: General - Other >> Dec 11, 2019  2:37 PM Dalphine Handing A wrote: Patient requesting calback from nurse to discuss potential side effects of medication she was advised to start taking tomorrow. Please advise >> Dec 12, 2019  9:27 AM Adela Ports M wrote: Sherron Monday with pt and she stated that in the past her throat has closed or at least felt like that's what was happening. Pt stated that she would like a call back regarding side effects that she should look for after starting fosamax 70mg .

## 2019-12-12 NOTE — Telephone Encounter (Signed)
With Fosamax main side effect can be increase in heart burn, belly pain, muscle aches.  Is this happens or any other side effects present she should let us know immediately and stop taking medication.

## 2019-12-12 NOTE — Telephone Encounter (Signed)
Patient notified of Jolene's message.   °

## 2020-01-10 ENCOUNTER — Other Ambulatory Visit: Payer: Self-pay | Admitting: Nurse Practitioner

## 2020-01-10 NOTE — Telephone Encounter (Signed)
Requested Prescriptions  Pending Prescriptions Disp Refills  . busPIRone (BUSPAR) 5 MG tablet [Pharmacy Med Name: busPIRone HCl 5 MG Oral Tablet] 180 tablet 1    Sig: Take 1 tablet by mouth twice daily as needed for anxiety     Psychiatry: Anxiolytics/Hypnotics - Non-controlled Passed - 01/10/2020  8:25 AM      Passed - Valid encounter within last 6 months    Recent Outpatient Visits          1 month ago Depression, major, single episode, moderate (HCC)   [facility] Cannady, [place] T, NP   7 months ago Essential hypertension   [facility] Cannady, Dorie Rank, NP   10 months ago Hypercholesteremia   [facility] Chamberlain, Forgan T, NP   11 months ago Depression, major, single episode, moderate (HCC)   [facility] [place], Jefferson T, NP   1 year ago Screening for breast cancer   [facility] [place], Dorie Rank, NP      Future Appointments            In 5 months Cannady, Dorie Rank, NP [facility]   In 5 months  [facility]

## 2020-02-24 ENCOUNTER — Ambulatory Visit (INDEPENDENT_AMBULATORY_CARE_PROVIDER_SITE_OTHER): Payer: Medicare Other | Admitting: Nurse Practitioner

## 2020-02-24 ENCOUNTER — Other Ambulatory Visit: Payer: Self-pay | Admitting: Nurse Practitioner

## 2020-02-24 ENCOUNTER — Encounter: Payer: Self-pay | Admitting: Nurse Practitioner

## 2020-02-24 ENCOUNTER — Other Ambulatory Visit: Payer: Self-pay

## 2020-02-24 DIAGNOSIS — H00029 Hordeolum internum unspecified eye, unspecified eyelid: Secondary | ICD-10-CM | POA: Insufficient documentation

## 2020-02-24 DIAGNOSIS — H00025 Hordeolum internum left lower eyelid: Secondary | ICD-10-CM

## 2020-02-24 MED ORDER — ERYTHROMYCIN 5 MG/GM OP OINT: 1.0000 "application " | TOPICAL_OINTMENT | Freq: Every day | OPHTHALMIC | 0 refills | Status: DC

## 2020-02-24 MED ORDER — ERYTHROMYCIN 5 MG/GM OP OINT: 1.0000 "application " | TOPICAL_OINTMENT | Freq: Every day | OPHTHALMIC | 0 refills | Status: AC

## 2020-02-24 NOTE — Assessment & Plan Note (Signed)
Acute x 3 days.  No red flag findings on exam.  Small area noted to left, lower, inner lid.  Script for erythromycin ointment sent.  Recommend use of artificial tears TID and application of warm compresses TID for 7 days.  She is to return to office for worsening or ongoing symptoms.  If ongoing will refer to ophthalmology.

## 2020-02-24 NOTE — Telephone Encounter (Signed)
Can we resend to [facility] please?

## 2020-02-24 NOTE — Patient Instructions (Signed)

## 2020-02-24 NOTE — Telephone Encounter (Signed)
Pt would like to have her refill for erythromycin ophthalmic ointment  Sent to [facility] in [place] asap

## 2020-02-24 NOTE — Progress Notes (Signed)
BP (!) 145/73   Pulse (!) 54   Temp 97.7 F (36.5 C) (Oral)   Wt 161 lb 3.2 oz (73.1 kg)   LMP 02/01/1991 (Approximate)   SpO2 97%   BMI 31.48 kg/m    Subjective:    Patient ID: Emily Watson, female    DOB: 04-29-1946, 74 y.o.   MRN: [id number]  HPI: Emily Watson is a 74 y.o. female  Chief Complaint  Patient presents with  . Pain    L eye pain since Saturday. States she had a sharp pain in her eye Saturday night, states she has never felt anything like it   EYE PAIN Started with a headache on Saturday night and then pain presented to left eye -- since this time has had no further pain.  Reports red eye presented -- no pain, pruritus.   Duration:  days Involved eye:  left Onset: sudden Severity: moderate -- none since Saturday Quality: sharp on Saturday night Foreign body sensation:no Visual impairment: no Eye redness: yes Discharge: no Crusting or matting of eyelids: no Swelling: no Photophobia: no Itching: no Tearing: a little bit Headache: Saturday night, none since Floaters: no URI symptoms: no Contact lens use: no Close contacts with similar problems: no Eye trauma: no Aggravating factors:  Alleviating factors:  Status: stable Treatments attempted: none  Relevant past medical, surgical, family and social history reviewed and updated as indicated. Interim medical history since our last visit reviewed. Allergies and medications reviewed and updated.  Review of Systems  Constitutional: Negative for activity change, appetite change, diaphoresis, fatigue and fever.  Eyes: Positive for pain (not today) and redness. Negative for photophobia, discharge, itching and visual disturbance.  Respiratory: Negative for cough, chest tightness and shortness of breath.   Cardiovascular: Negative for chest pain, palpitations and leg swelling.  Gastrointestinal: Negative.   Neurological: Negative.   Psychiatric/Behavioral: Negative.     Per HPI unless specifically  indicated above     Objective:    BP (!) 145/73   Pulse (!) 54   Temp 97.7 F (36.5 C) (Oral)   Wt 161 lb 3.2 oz (73.1 kg)   LMP 02/01/1991 (Approximate)   SpO2 97%   BMI 31.48 kg/m   Wt Readings from Last 3 Encounters:  02/24/20 161 lb 3.2 oz (73.1 kg)  12/09/19 163 lb (73.9 kg)  06/26/19 173 lb (78.5 kg)    Physical Exam Vitals and nursing note reviewed.  Constitutional:      General: She is awake. She is not in acute distress.    Appearance: She is well-developed and well-groomed. She is obese. She is not ill-appearing.  HENT:     Head: Normocephalic.     Right Ear: Hearing normal.     Left Ear: Hearing normal.  Eyes:     General: Lids are normal. Lids are everted, no foreign bodies appreciated. Vision grossly intact.        Right eye: No foreign body, discharge or hordeolum.        Left eye: Hordeolum (to lower, inner lid) present.No foreign body or discharge.     Extraocular Movements: Extraocular movements intact.     Conjunctiva/sclera:     Right eye: Right conjunctiva is not injected. No exudate or hemorrhage.    Left eye: Left conjunctiva is injected. No exudate or hemorrhage.    Pupils: Pupils are equal, round, and reactive to light.     Visual Fields: Right eye visual fields normal and left eye visual fields normal.  Neck:     Thyroid: No thyromegaly.     Vascular: No carotid bruit or JVD.  Cardiovascular:     Rate and Rhythm: Normal rate and regular rhythm.     Heart sounds: Normal heart sounds. No murmur heard.  No gallop.   Pulmonary:     Effort: Pulmonary effort is normal.     Breath sounds: Normal breath sounds.  Abdominal:     General: Bowel sounds are normal.     Palpations: Abdomen is soft. There is no hepatomegaly or splenomegaly.  Musculoskeletal:     Cervical back: Normal range of motion and neck supple.     Right lower leg: No edema.     Left lower leg: No edema.  Lymphadenopathy:     Cervical: No cervical adenopathy.  Skin:    General:  Skin is warm and dry.  Neurological:     Mental Status: She is alert and oriented to person, place, and time.  Psychiatric:        Attention and Perception: Attention normal.        Mood and Affect: Mood normal.        Behavior: Behavior normal. Behavior is cooperative.        Thought Content: Thought content normal.        Judgment: Judgment normal.    Eye exams: Right 25/20 and Left 20/20  Results for orders placed or performed in visit on 12/09/19  Basic metabolic panel  Result Value Ref Range   Glucose 58 (L) 65 - 99 mg/dL   BUN 17 8 - 27 mg/dL   Creatinine, Ser 8.67 0.57 - 1.00 mg/dL   GFR calc non Af Amer 86 >59 mL/min/1.73   GFR calc Af Amer 99 >59 mL/min/1.73   BUN/Creatinine Ratio 24 12 - 28   Sodium 143 134 - 144 mmol/L   Potassium 3.9 3.5 - 5.2 mmol/L   Chloride 101 96 - 106 mmol/L   CO2 26 20 - 29 mmol/L   Calcium 8.9 8.7 - 10.3 mg/dL  Lipid Panel w/o Chol/HDL Ratio  Result Value Ref Range   Cholesterol, Total 160 100 - 199 mg/dL   Triglycerides 79 0 - 149 mg/dL   HDL 56 >67 mg/dL   VLDL Cholesterol Cal 15 5 - 40 mg/dL   LDL Chol Calc (NIH) 89 0 - 99 mg/dL  VITAMIN D 25 Hydroxy (Vit-D Deficiency, Fractures)  Result Value Ref Range   Vit D, 25-Hydroxy 31.1 30.0 - 100.0 ng/mL  Magnesium  Result Value Ref Range   Magnesium 2.2 1.6 - 2.3 mg/dL      Assessment & Plan:   Problem List Items Addressed This Visit      Musculoskeletal and Integument   Hordeolum eyelid, internal    Acute x 3 days.  No red flag findings on exam.  Small area noted to left, lower, inner lid.  Script for erythromycin ointment sent.  Recommend use of artificial tears TID and application of warm compresses TID for 7 days.  She is to return to office for worsening or ongoing symptoms.  If ongoing will refer to ophthalmology.            Follow up plan: Return if symptoms worsen or fail to improve.

## 2020-03-16 ENCOUNTER — Other Ambulatory Visit: Payer: Self-pay

## 2020-03-16 ENCOUNTER — Ambulatory Visit (INDEPENDENT_AMBULATORY_CARE_PROVIDER_SITE_OTHER): Payer: Medicare Other | Admitting: Nurse Practitioner

## 2020-03-16 ENCOUNTER — Encounter: Payer: Self-pay | Admitting: Nurse Practitioner

## 2020-03-16 VITALS — BP 136/71 | HR 70 | Temp 98.2°F | Wt 159.4 lb

## 2020-03-16 DIAGNOSIS — H00025 Hordeolum internum left lower eyelid: Secondary | ICD-10-CM | POA: Diagnosis not present

## 2020-03-16 NOTE — Patient Instructions (Signed)
Chalazion  A chalazion is a swelling or lump on the eyelid. It can affect the upper eyelid or the lower eyelid. What are the causes? This condition may be caused by:  Long-lasting (chronic) inflammation of the eyelid glands.  A blocked oil gland in the eyelid. What are the signs or symptoms? Symptoms of this condition include:  Swelling of the eyelid. The swelling may spread to areas around the eye.  A hard lump on the eyelid.  Blurry vision. The lump on the eyelid may make it hard to see out of the eye. How is this diagnosed? This condition is diagnosed with an examination of the eye. How is this treated? This condition is treated by applying a warm compress to the eyelid. If the condition does not improve, it may be treated with:  Medicine that is injected into the chalazion by a health care provider.  Surgery.  Medicine that is applied to the eye. Follow these instructions at home: Managing pain and swelling  Apply a warm, moist compress to the eyelid 4-6 times a day for 10-15 minutes at a time. This will help to open any blocked glands and to reduce redness and swelling.  Apply over-the-counter and prescription medicines only as told by your health care provider. General instructions  Do not touch the chalazion.  Do not try to remove the pus. Do not squeeze the chalazion or stick it with a pin or needle.  Do not rub your eyes.  Wash your hands often. Dry your hands with a clean towel.  Keep your face, scalp, and eyebrows clean.  Avoid wearing eye makeup.  If the chalazion does not break open (rupture) on its own, return to your health care provider.  Keep all follow-up appointments as told by your health care provider. This is important. Contact a health care provider if:  Your eyelid has not improved in 4 weeks.  Your eyelid is getting worse.  You have a fever.  The chalazion does not rupture on its own after a month of home treatment. Get help right  away if:  You have pain in your eye.  Your vision changes.  The chalazion becomes painful or red.  The chalazion gets bigger. Summary  A chalazion is a swelling or lump on the upper or lower eyelid.  It may be caused by chronic inflammation or a blocked oil gland.  Apply a warm, moist compress to the eyelid 4-6 times a day for 10-15 minutes at a time.  Keep your face, scalp, and eyebrows clean. This information is not intended to replace advice given to you by your health care provider. Make sure you discuss any questions you have with your health care provider. Document Revised: 01/24/2018 Document Reviewed: 01/24/2018 Elsevier Patient Education  2020 [employer]  

## 2020-03-16 NOTE — Assessment & Plan Note (Signed)
Acute x 3 weeks.  Appears to be mostly resolved at this point, however eye continues to be inflamed and bothersome to patient.  Explained use of erythromycin to patient and okay to continue to use, however limit use of finger when applying ointment.  Okay to apply right from the tube or with Q-tip.  Continue artificial tears and warm compresses prn discomfort.  Will place referral to ophthalmology, if symptoms completely resolve in the meantime, can cancel appointment.

## 2020-03-16 NOTE — Progress Notes (Signed)
BP (!) 136/71    Pulse 70    Temp 98.2 F (36.8 C) (Oral)    Wt 159 lb 6.4 oz (72.3 kg)    LMP 02/01/1991 (Approximate)    SpO2 97%    BMI 31.13 kg/m    Subjective:    Patient ID: Emily Watson, female    DOB: 1945/09/24, 74 y.o.   MRN: [id number]  HPI: Emily Watson is a 74 y.o. female presenting for stye follow up.  Chief Complaint  Patient presents with   Stye    left eye, pt states her eye is not better from last visit. States it is still painful. Using erythromycin ointment sent in 02/24/20   EYE PAIN Patient reports that the eye is not much better; it is still painful and she is wondering if she needs an antibiotic to kill "whatever is going on with it."  She reports using the warm compresses and erythromycin ointment as prescribed. Duration:  weeks Involved eye:  left Onset: sudden Severity: moderate  Quality: feels like a bruise Foreign body sensation:no Visual impairment: no Eye redness: yes Discharge: no Crusting or matting of eyelids: no Swelling: yes Photophobia: no Itching: yes Tearing: yes Headache: no Floaters: no URI symptoms: no Contact lens use: no Close contacts with similar problems: no Eye trauma: no Aggravating factors: touching it Alleviating factors: warm compress Status: stable Treatments attempted: warm compress, erythromycin ointment  Allergies  Allergen Reactions   Codeine Other (See Comments)    Sweating and passed out.   Chlorpheniramine-Phenylephrine Other (See Comments)    Skin reddened and hands peeled. She was told it was a chemical reaction between aspirin(gum) and Actifed.   Gabapentin Other (See Comments)    Constant stomach pains   Other Other (See Comments)    Macadamia nuts- lips go numb   Peppermint Oil Other (See Comments)    Difficulty breathing    Triprolidine-Pseudoephedrine Other (See Comments)    Skin reddened and hands peeled. She was told it was a chemical reaction between aspirin(gum) and Actifed.    Outpatient Encounter Medications as of 03/16/2020  Medication Sig Note   alendronate (FOSAMAX) 70 MG tablet Take 1 tablet (70 mg total) by mouth every 7 (seven) days. Take with a full glass of water on an empty stomach.    aspirin (ASPIRIN EC) 81 MG EC tablet Take 81 mg by mouth daily. Swallow whole.    atorvastatin (LIPITOR) 10 MG tablet Take 1 tablet (10 mg total) by mouth daily at 6 PM.    busPIRone (BUSPAR) 5 MG tablet Take 1 tablet by mouth twice daily as needed for anxiety    Cholecalciferol (VITAMIN D3 PO) Take by mouth daily. 2000 IU ( )    omeprazole (PRILOSEC OTC) 20 MG tablet Take 1 tablet (20 mg total) by mouth daily.    traZODone (DESYREL) 50 MG tablet Take 1 tablet (50 mg total) by mouth at bedtime. 06/26/2019: 1/2 tab   No facility-administered encounter medications on file as of 03/16/2020.   Patient Active Problem List   Diagnosis Date Noted   Hordeolum eyelid, internal 02/24/2020   Osteoporosis 11/04/2019   Obesity 06/10/2019   Bilateral hearing loss 06/10/2019   Insomnia 05/31/2018   Skin lesions 08/18/2016   Depression, major, single episode, moderate (HCC) 08/18/2016   Allergic rhinitis 06/07/2015   Carpal tunnel syndrome 06/07/2015   Hypercholesteremia 06/07/2015   Hypertension 06/07/2015   Generalized anxiety disorder 06/07/2015   GERD (gastroesophageal reflux disease) 12/31/2013   Past Medical  History:  Diagnosis Date   Allergy    Anaphylaxis    Anxiety    Arthritis    Carpal tunnel syndrome    Depression    GERD (gastroesophageal reflux disease)    Hypertension    patient denies   Peripheral edema    Trigger thumb of left hand    Relevant past medical, surgical, family and social history reviewed and updated as indicated. Interim medical history since our last visit reviewed.  Review of Systems  Constitutional: Negative.  Negative for activity change, appetite change, fatigue and fever.  Eyes: Positive for pain,  redness and itching. Negative for photophobia, discharge and visual disturbance.  Skin: Negative.   Neurological: Negative.  Negative for headaches.  Psychiatric/Behavioral: Negative.     Per HPI unless specifically indicated above     Objective:    BP (!) 136/71    Pulse 70    Temp 98.2 F (36.8 C) (Oral)    Wt 159 lb 6.4 oz (72.3 kg)    LMP 02/01/1991 (Approximate)    SpO2 97%    BMI 31.13 kg/m   Wt Readings from Last 3 Encounters:  03/16/20 159 lb 6.4 oz (72.3 kg)  02/24/20 161 lb 3.2 oz (73.1 kg)  12/09/19 163 lb (73.9 kg)    Physical Exam Vitals and nursing note reviewed.  Constitutional:      General: She is not in acute distress.    Appearance: Normal appearance. She is not toxic-appearing.  HENT:     Head: Normocephalic and atraumatic.     Right Ear: External ear normal.     Left Ear: External ear normal.  Eyes:     General: Lids are normal. No visual field deficit or scleral icterus.       Right eye: No foreign body, discharge or hordeolum.        Left eye: No foreign body, discharge or hordeolum.     Extraocular Movements: Extraocular movements intact.     Right eye: Normal extraocular motion and no nystagmus.     Left eye: Normal extraocular motion and no nystagmus.     Conjunctiva/sclera:     Right eye: No exudate or hemorrhage.    Left eye: Left conjunctiva is injected. No exudate or hemorrhage.    Pupils: Pupils are equal, round, and reactive to light.  Skin:    General: Skin is warm and dry.     Coloration: Skin is not jaundiced or pale.  Neurological:     General: No focal deficit present.     Mental Status: She is alert and oriented to person, place, and time.     Motor: No weakness.     Gait: Gait normal.  Psychiatric:        Mood and Affect: Mood normal.        Behavior: Behavior normal.        Thought Content: Thought content normal.        Judgment: Judgment normal.       Assessment & Plan:   Problem List Items Addressed This Visit       Musculoskeletal and Integument   Hordeolum eyelid, internal - Primary    Acute x 3 weeks.  Appears to be mostly resolved at this point, however eye continues to be inflamed and bothersome to patient.  Explained use of erythromycin to patient and okay to continue to use, however limit use of finger when applying ointment.  Okay to apply right from the tube or with  Q-tip.  Continue artificial tears and warm compresses prn discomfort.  Will place referral to ophthalmology, if symptoms completely resolve in the meantime, can cancel appointment.          Follow up plan: Return if symptoms worsen or fail to improve.

## 2020-06-09 ENCOUNTER — Encounter: Payer: Self-pay | Admitting: Nurse Practitioner

## 2020-06-09 ENCOUNTER — Other Ambulatory Visit: Payer: Self-pay

## 2020-06-09 ENCOUNTER — Ambulatory Visit (INDEPENDENT_AMBULATORY_CARE_PROVIDER_SITE_OTHER): Payer: Medicare Other | Admitting: Nurse Practitioner

## 2020-06-09 VITALS — BP 125/72 | HR 64 | Temp 98.2°F | Resp 16 | Ht 60.0 in | Wt 155.8 lb

## 2020-06-09 DIAGNOSIS — Z0000 Encounter for general adult medical examination without abnormal findings: Secondary | ICD-10-CM | POA: Diagnosis not present

## 2020-06-09 DIAGNOSIS — F321 Major depressive disorder, single episode, moderate: Secondary | ICD-10-CM

## 2020-06-09 DIAGNOSIS — E7800 Pure hypercholesterolemia, unspecified: Secondary | ICD-10-CM | POA: Diagnosis not present

## 2020-06-09 DIAGNOSIS — K219 Gastro-esophageal reflux disease without esophagitis: Secondary | ICD-10-CM

## 2020-06-09 DIAGNOSIS — E6609 Other obesity due to excess calories: Secondary | ICD-10-CM

## 2020-06-09 DIAGNOSIS — Z6830 Body mass index (BMI) 30.0-30.9, adult: Secondary | ICD-10-CM

## 2020-06-09 DIAGNOSIS — Z23 Encounter for immunization: Secondary | ICD-10-CM

## 2020-06-09 DIAGNOSIS — M810 Age-related osteoporosis without current pathological fracture: Secondary | ICD-10-CM

## 2020-06-09 DIAGNOSIS — F411 Generalized anxiety disorder: Secondary | ICD-10-CM | POA: Diagnosis not present

## 2020-06-09 DIAGNOSIS — I10 Essential (primary) hypertension: Secondary | ICD-10-CM

## 2020-06-09 DIAGNOSIS — F5101 Primary insomnia: Secondary | ICD-10-CM

## 2020-06-09 DIAGNOSIS — E66811 Other obesity due to excess calories: Secondary | ICD-10-CM

## 2020-06-09 DIAGNOSIS — H5789 Other specified disorders of eye and adnexa: Secondary | ICD-10-CM | POA: Insufficient documentation

## 2020-06-09 MED ORDER — TRAZODONE HCL 50 MG PO TABS: 50.0000 mg | ORAL_TABLET | Freq: Every day | ORAL | 4 refills | Status: DC

## 2020-06-09 MED ORDER — ATORVASTATIN CALCIUM 10 MG PO TABS
10.0000 mg | ORAL_TABLET | Freq: Every day | ORAL | 4 refills | Status: DC
Start: 2020-06-09 — End: 2021-06-13

## 2020-06-09 MED ORDER — BUSPIRONE HCL 5 MG PO TABS
5.0000 mg | ORAL_TABLET | Freq: Two times a day (BID) | ORAL | 3 refills | Status: DC | PRN
Start: 2020-06-09 — End: 2021-06-13

## 2020-06-09 NOTE — Progress Notes (Signed)
BP 125/72 (BP Location: Left Arm, Patient Position: Sitting, Cuff Size: Normal)    Pulse 64    Temp 98.2 F (36.8 C) (Oral)    Resp 16    Ht 5' (1.524 m)    Wt 155 lb 12.8 oz (70.7 kg)    LMP 02/01/1991 (Approximate)    SpO2 98%    BMI 30.43 kg/m    Subjective:    Patient ID: Emily Watson, female    DOB: March 01, 1946, 74 y.o.   MRN: [id number]  HPI: Emily Realheresa Buehl is a 74 y.o. female presenting on 06/09/2020 for comprehensive medical examination. Current medical complaints include:none  She currently lives with: self Menopausal Symptoms: no  HYPERTENSION / HYPERLIPIDEMIA Continues on Lipitor for cholesterol, is tolerating without ADR.  No current BP medications.  Has been focused on diet and weight loss.  Has lost 4 pounds since last visit -- she was 187 pounds in March 2020 and now 155 pounds.    She continues to take daily Omeprazole for GERD with no issues. Satisfied with current treatment?yes Duration of hypertension:chronic BP monitoring frequency:a couple times a week BP range:120/70 range at home Duration of hyperlipidemia:chronic Cholesterol medication side effects:no Cholesterol supplements: none Medication compliance:good compliance Aspirin:yes Recent stressors:no Recurrent headaches:no Visual changes:no Palpitations:no Dyspnea:no Chest pain:no Lower extremity edema:no Dizzy/lightheaded:no  OSTEOPOROSIS Last DEXA scan was March 2021 -- did note osteoporosis with T Score -3.0. Started on Fosamax at that time and has tolerated.  She is taking daily supplements, Vit D and Calcium. Satisfied with current treatment?: yes Medication side effects: no Medication compliance: good compliance Past osteoporosis medications/treatments:  Adequate calcium & vitamin D: yes Intolerance to bisphosphonates:no Weight bearing exercises: yes  EYE PAIN Reports red eye presented right eye -- with mild pain -- started about one week ago.  Sees ophthalmology  tomorrow -- has been using leftover medication. Duration:  days Involved eye:  left Onset: sudden Severity: moderate  Quality: aching Foreign body sensation:no Visual impairment: no Eye redness: yes Discharge: no Crusting or matting of eyelids: no Swelling: no Photophobia: no Itching: no Tearing: present Headache: none Floaters: no URI symptoms: no Contact lens use: no Close contacts with similar problems: no Eye trauma: no Aggravating factors: unknown Alleviating factors: warm compress Status: stable Treatments attempted: antibiotic ointment -- not much left  DEPRESSION/ANXIETY Continues on Trazodone50 MG at night + Buspar 5 MG as needed.  Mood status:stable Satisfied with current treatment?:yes Symptom severity:mild Duration of current treatment :chronic Side effects:no Medication compliance:good compliance Psychotherapy/counseling:none Depressed mood:yes Anxious mood:yes Anhedonia:no Significant weight loss or gain:no Insomnia:none improved with Trazodone Fatigue:no Feelings of worthlessness or guilt:no Impaired concentration/indecisiveness:no Suicidal ideations:no Hopelessness:no Crying spells:no [facility] screen [facility] 06/09/2020 12/09/2019 06/26/2019 06/10/2019 03/04/2019  Decreased Interest 0 0 0 0 0  Down, Depressed, Hopeless 0 0 1 0 1  PHQ - 2 Score 0 0 1 0 1  Altered sleeping 0 0 - 0 0  Tired, decreased energy 0 0 - 0 0  Change in appetite 0 0 - 0 0  Feeling bad or failure about yourself  0 0 - 0 0  Trouble concentrating 0 0 - 0 0  Moving slowly or fidgety/restless 0 0 - 1 0  Suicidal thoughts 0 0 - 0 0  PHQ-9 Score 0 0 - 1 1  Difficult doing work/chores Not difficult at all Not difficult at all - Not difficult at all -  Some recent data might be hidden    The patient does not have  a history of falls. I did not complete a risk assessment for falls. A plan of care for falls was not documented.   Past Medical History:    Past Medical History:  Diagnosis Date   Allergy    Anaphylaxis    Anxiety    Arthritis    Carpal tunnel syndrome    Depression    GERD (gastroesophageal reflux disease)    Hypertension    patient denies   Peripheral edema    Trigger thumb of left hand     Surgical History:  Past Surgical History:  Procedure Laterality Date   CARPAL TUNNEL RELEASE     HERNIA REPAIR  04/2009   ROTATOR CUFF REPAIR Left    TOTAL KNEE ARTHROPLASTY Left 03/27/2017   Procedure: TOTAL KNEE ARTHROPLASTY - LEFT;  Surgeon: Kennedy Bucker, MD;  Location: [facility] ORS;  Service: Orthopedics;  Laterality: Left;    Medications:  Current Outpatient Medications on File Prior to Visit  Medication Sig   alendronate (FOSAMAX) 70 MG tablet Take 1 tablet (70 mg total) by mouth every 7 (seven) days. Take with a full glass of water on an empty stomach.   aspirin (ASPIRIN EC) 81 MG EC tablet Take 81 mg by mouth daily. Swallow whole.   Cholecalciferol (VITAMIN D3 PO) Take by mouth daily. 2000 IU ( )   omeprazole (PRILOSEC OTC) 20 MG tablet Take 1 tablet (20 mg total) by mouth daily.   No current facility-administered medications on file prior to visit.    Allergies:  Allergies  Allergen Reactions   Codeine Other (See Comments)    Sweating and passed out.   Chlorpheniramine-Phenylephrine Other (See Comments)    Skin reddened and hands peeled. She was told it was a chemical reaction between aspirin(gum) and Actifed.   Gabapentin Other (See Comments)    Constant stomach pains   Other Other (See Comments)    Macadamia nuts- lips go numb   Peppermint Oil Other (See Comments)    Difficulty breathing    Triprolidine-Pseudoephedrine Other (See Comments)    Skin reddened and hands peeled. She was told it was a chemical reaction between aspirin(gum) and Actifed.    Social History:  Social History   Socioeconomic History   Marital status: Divorced    Spouse name: Not on file   Number  of children: Not on file   Years of education: Not on file   Highest education level: High school graduate  Occupational History   Occupation: retired  Tobacco Use   Smoking status: Former Smoker    Quit date: 03/14/1997    Years since quitting: 23.2   Smokeless tobacco: Never Used  Vaping Use   Vaping Use: Never used  Substance and Sexual Activity   Alcohol use: No    Alcohol/week: 0.0 standard drinks   Drug use: No   Sexual activity: Not on file  Other Topics Concern   Not on file  Social History Narrative   Not on file   Social Determinants of Health   Financial Resource Strain:    Difficulty of Paying Living Expenses: Not on file  Food Insecurity:    Worried About Programme researcher, broadcasting/film/video in the Last Year: Not on file   [employer] of Food in the Last Year: Not on file  Transportation Needs:    Lack of Transportation (Medical): Not on file   Lack of Transportation (Non-Medical): Not on file  Physical Activity:    Days of Exercise per Week: Not on  file   Minutes of Exercise per Session: Not on file  Stress:    Feeling of Stress : Not on file  Social Connections:    Frequency of Communication with Friends and Family: Not on file   Frequency of Social Gatherings with Friends and Family: Not on file   Attends Religious Services: Not on file   Active Member of Clubs or Organizations: Not on file   Attends Banker Meetings: Not on file   Marital Status: Not on file  Intimate Partner Violence:    Fear of Current or Ex-Partner: Not on file   Emotionally Abused: Not on file   Physically Abused: Not on file   Sexually Abused: Not on file   Social History   Tobacco Use  Smoking Status Former Smoker   Quit date: 03/14/1997   Years since quitting: 23.2  Smokeless Tobacco Never Used   Social History   Substance and Sexual Activity  Alcohol Use No   Alcohol/week: 0.0 standard drinks    Family History:  Family History  Problem  Relation Age of Onset   Kidney disease Mother    Cancer Mother    Kidney disease Father    Cancer Father    Cancer Sister        bone   Hypertension Sister    Osteoporosis Sister    Breast cancer Sister    Other Son 32       brain tumor   Alzheimer's disease Sister    Hypertension Brother     Past medical history, surgical history, medications, allergies, family history and social history reviewed with patient today and changes made to appropriate areas of the chart.   Review of Systems - negative All other ROS negative except what is listed above and in the HPI.      Objective:    BP 125/72 (BP Location: Left Arm, Patient Position: Sitting, Cuff Size: Normal)    Pulse 64    Temp 98.2 F (36.8 C) (Oral)    Resp 16    Ht 5' (1.524 m)    Wt 155 lb 12.8 oz (70.7 kg)    LMP 02/01/1991 (Approximate)    SpO2 98%    BMI 30.43 kg/m   Wt Readings from Last 3 Encounters:  06/09/20 155 lb 12.8 oz (70.7 kg)  03/16/20 159 lb 6.4 oz (72.3 kg)  02/24/20 161 lb 3.2 oz (73.1 kg)    Physical Exam Constitutional:      General: She is awake. She is not in acute distress.    Appearance: She is well-developed. She is not ill-appearing.  HENT:     Head: Normocephalic and atraumatic.     Right Ear: Hearing, tympanic membrane, ear canal and external ear normal. No drainage.     Left Ear: Hearing, tympanic membrane, ear canal and external ear normal. No drainage.     Nose: Nose normal.     Right Sinus: No maxillary sinus tenderness or frontal sinus tenderness.     Left Sinus: No maxillary sinus tenderness or frontal sinus tenderness.     Mouth/Throat:     Mouth: Mucous membranes are moist.     Pharynx: Oropharynx is clear. Uvula midline. No pharyngeal swelling, oropharyngeal exudate or posterior oropharyngeal erythema.  Eyes:     General: Lids are normal. No visual field deficit or scleral icterus.       Right eye: No discharge.        Left eye: No discharge.  Extraocular  Movements: Extraocular movements intact.     Conjunctiva/sclera:     Right eye: Right conjunctiva is injected. No exudate.    Left eye: Left conjunctiva is not injected. No exudate.    Pupils: Pupils are equal, round, and reactive to light.     Visual Fields: Right eye visual fields normal and left eye visual fields normal.  Neck:     Thyroid: No thyromegaly.     Vascular: No carotid bruit.     Trachea: Trachea normal.  Cardiovascular:     Rate and Rhythm: Normal rate and regular rhythm.     Heart sounds: Normal heart sounds. No murmur heard.  No gallop.   Pulmonary:     Effort: Pulmonary effort is normal. No accessory muscle usage or respiratory distress.     Breath sounds: Normal breath sounds.  Chest:     Breasts:        Right: Normal.        Left: Normal.  Abdominal:     General: Bowel sounds are normal.     Palpations: Abdomen is soft. There is no hepatomegaly or splenomegaly.     Tenderness: There is no abdominal tenderness.  Musculoskeletal:        General: Normal range of motion.     Cervical back: Normal range of motion and neck supple.     Right lower leg: No edema.     Left lower leg: No edema.  Lymphadenopathy:     Head:     Right side of head: No submental, submandibular, tonsillar, preauricular or posterior auricular adenopathy.     Left side of head: No submental, submandibular, tonsillar, preauricular or posterior auricular adenopathy.     Cervical: No cervical adenopathy.     Upper Body:     Right upper body: No supraclavicular, axillary or pectoral adenopathy.     Left upper body: No supraclavicular, axillary or pectoral adenopathy.  Skin:    General: Skin is warm and dry.     Capillary Refill: Capillary refill takes less than 2 seconds.     Findings: No rash.  Neurological:     Mental Status: She is alert and oriented to person, place, and time.     Cranial Nerves: Cranial nerves are intact.     Gait: Gait is intact.     Deep Tendon Reflexes: Reflexes  are normal and symmetric.     Reflex Scores:      Brachioradialis reflexes are 2+ on the right side and 2+ on the left side.      Patellar reflexes are 2+ on the right side and 2+ on the left side. Psychiatric:        Attention and Perception: Attention normal.        Mood and Affect: Mood normal.        Speech: Speech normal.        Behavior: Behavior normal. Behavior is cooperative.        Thought Content: Thought content normal.        Judgment: Judgment normal.     Results for orders placed or performed in visit on 12/09/19  Basic metabolic panel  Result Value Ref Range   Glucose 58 (L) 65 - 99 mg/dL   BUN 17 8 - 27 mg/dL   Creatinine, Ser 2.56 0.57 - 1.00 mg/dL   GFR calc non Af Amer 86 >59 mL/min/1.73   GFR calc Af Amer 99 >59 mL/min/1.73   BUN/Creatinine Ratio 24 12 -  28   Sodium 143 134 - 144 mmol/L   Potassium 3.9 3.5 - 5.2 mmol/L   Chloride 101 96 - 106 mmol/L   CO2 26 20 - 29 mmol/L   Calcium 8.9 8.7 - 10.3 mg/dL  Lipid Panel w/o Chol/HDL Ratio  Result Value Ref Range   Cholesterol, Total 160 100 - 199 mg/dL   Triglycerides 79 0 - 149 mg/dL   HDL 56 >16 mg/dL   VLDL Cholesterol Cal 15 5 - 40 mg/dL   LDL Chol Calc (NIH) 89 0 - 99 mg/dL  VITAMIN D 25 Hydroxy (Vit-D Deficiency, Fractures)  Result Value Ref Range   Vit D, 25-Hydroxy 31.1 30.0 - 100.0 ng/mL  Magnesium  Result Value Ref Range   Magnesium 2.2 1.6 - 2.3 mg/dL      Assessment & Plan:   Problem List Items Addressed This Visit      Cardiovascular and Mediastinum   Hypertension    Chronic, stable with BP at goal today.  Continue monitoring BP at home 3 mornings a week and documenting for provider.  Will monitor and initiate medications if consistently elevated.  CMP and TSH + CBC today.  Return in 6 months.      Relevant Medications   atorvastatin (LIPITOR) 10 MG tablet   Other Relevant Orders   CBC with Differential/Platelet   Comprehensive metabolic panel   TSH     Digestive   GERD  (gastroesophageal reflux disease)    Chronic, stable with Prilosec.  Denies any symptoms at this time and no use of TUMS.  Continue current medication regimen and adjust as needed.  Mag level today.      Relevant Orders   Magnesium     Musculoskeletal and Integument   Osteoporosis    Noted on DEXA March 2021.  She is taking daily Calcium and Vit D3, to continue this.  Continue Fosamax, educated her at length on this and provided information to her.  Repeat DEXA in 2023.  Monitor for fractures or falls.  Vit D level today.      Relevant Orders   VITAMIN D 25 Hydroxy (Vit-D Deficiency, Fractures)     Other   Hypercholesteremia    Chronic, ongoing.  Continue current medication regimen and adjust as needed.  Lipid panel today.  Praised for ongoing weight loss.      Relevant Medications   atorvastatin (LIPITOR) 10 MG tablet   Other Relevant Orders   Comprehensive metabolic panel   Lipid Panel w/o Chol/HDL Ratio   Generalized anxiety disorder    Chronic, ongoing.  Continue current medication regimen, has benefit from Buspar as needed.  Uses minimally.  Adjust regimen as needed.  Refills sent.      Relevant Medications   busPIRone (BUSPAR) 5 MG tablet   traZODone (DESYREL) 50 MG tablet   Depression, major, single episode, moderate (HCC)    Chronic, stable on current medication regimen. Denies SI/HI.  Continue Trazodone which benefits sleep and mood + Buspar as needed.  Adjust regimen as needed based on mood and scores.  Refills sent.  Return in 6 months.      Relevant Medications   busPIRone (BUSPAR) 5 MG tablet   traZODone (DESYREL) 50 MG tablet   Insomnia    Chronic, improved with Trazodone.  Continue current medication which benefits mood and sleep.  Adjust as needed.  Refills sent.      Obesity    Praised for ongoing focus on healthy diet and weight loss --  has lost over 30 pounds since March 2020 and now wearing size 14.  Recommended eating smaller high protein, low fat  meals more frequently and exercising 30 mins a day 5 times a week with a goal of 10-15lb weight loss in the next 3 months. Patient voiced their understanding and motivation to adhere to these recommendations.       Eye redness    Sees ophthalmology tomorrow, will review plan of care and recommendations once available.         Other Visit Diagnoses    Routine general medical examination at a health care facility    -  Primary   Need for influenza vaccination       Flu vaccine today   Relevant Orders   Flu Vaccine QUAD High Dose(Fluad) (Completed)       Follow up plan: Return in about 6 months (around 12/08/2020) for HLD and MOOD.   LABORATORY TESTING:  - Pap smear: not applicable  IMMUNIZATIONS:   - Tdap: Tetanus vaccination status reviewed: refused today - Influenza: Up to date - Pneumovax: Up to date - Prevnar: Up to date - HPV: Not applicable - Zostavax vaccine: Refused  SCREENING: -Mammogram: Up to date  - Colonoscopy: Refused  - Bone Density: Up to date  -Hearing Test: Not applicable  -Spirometry: Not applicable   PATIENT COUNSELING:   Advised to take 1 mg of folate supplement per day if capable of pregnancy.   Sexuality: Discussed sexually transmitted diseases, partner selection, use of condoms, avoidance of unintended pregnancy  and contraceptive alternatives.   Advised to avoid cigarette smoking.  I discussed with the patient that most people either abstain from alcohol or drink within safe limits (<=14/week and <=4 drinks/occasion for males, <=7/weeks and <= 3 drinks/occasion for females) and that the risk for alcohol disorders and other health effects rises proportionally with the number of drinks per week and how often a drinker exceeds daily limits.  Discussed cessation/primary prevention of drug use and availability of treatment for abuse.   Diet: Encouraged to adjust caloric intake to maintain  or achieve ideal body weight, to reduce intake of dietary  saturated fat and total fat, to limit sodium intake by avoiding high sodium foods and not adding table salt, and to maintain adequate dietary potassium and calcium preferably from fresh fruits, vegetables, and low-fat dairy products.    stressed the importance of regular exercise  Injury prevention: Discussed safety belts, safety helmets, smoke detector, smoking near bedding or upholstery.   Dental health: Discussed importance of regular tooth brushing, flossing, and dental visits.    NEXT PREVENTATIVE PHYSICAL DUE IN 1 YEAR. Return in about 6 months (around 12/08/2020) for HLD and MOOD.

## 2020-06-09 NOTE — Assessment & Plan Note (Signed)
Chronic, ongoing.  Continue current medication regimen, has benefit from Buspar as needed.  Uses minimally.  Adjust regimen as needed.  Refills sent.

## 2020-06-09 NOTE — Assessment & Plan Note (Signed)
Chronic, stable on current medication regimen. Denies SI/HI.  Continue Trazodone which benefits sleep and mood + Buspar as needed.  Adjust regimen as needed based on mood and scores.  Refills sent.  Return in 6 months.

## 2020-06-09 NOTE — Assessment & Plan Note (Signed)
Chronic, ongoing.  Continue current medication regimen and adjust as needed.  Lipid panel today.  Praised for ongoing weight loss.

## 2020-06-09 NOTE — Assessment & Plan Note (Signed)
Chronic, improved with Trazodone.  Continue current medication which benefits mood and sleep.  Adjust as needed.  Refills sent.

## 2020-06-09 NOTE — Assessment & Plan Note (Signed)
Sees ophthalmology tomorrow, will review plan of care and recommendations once available.

## 2020-06-09 NOTE — Patient Instructions (Signed)
Healthy Eating Following a healthy eating pattern may help you to achieve and maintain a healthy body weight, reduce the risk of chronic disease, and live a long and productive life. It is important to follow a healthy eating pattern at an appropriate calorie level for your body. Your nutritional needs should be met primarily through food by choosing a variety of nutrient-rich foods. What are tips for following this plan? Reading food labels  Read labels and choose the following: ? Reduced or low sodium. ? Juices with 100% fruit juice. ? Foods with low saturated fats and high polyunsaturated and monounsaturated fats. ? Foods with whole grains, such as whole wheat, cracked wheat, brown rice, and wild rice. ? Whole grains that are fortified with folic acid. This is recommended for women who are pregnant or who want to become pregnant.  Read labels and avoid the following: ? Foods with a lot of added sugars. These include foods that contain brown sugar, corn sweetener, corn syrup, dextrose, fructose, glucose, high-fructose corn syrup, honey, invert sugar, lactose, malt syrup, maltose, molasses, raw sugar, sucrose, trehalose, or turbinado sugar.  Do not eat more than the following amounts of added sugar per day:  6 teaspoons (25 g) for women.  9 teaspoons (38 g) for men. ? Foods that contain processed or refined starches and grains. ? Refined grain products, such as white flour, degermed cornmeal, white bread, and white rice. Shopping  Choose nutrient-rich snacks, such as vegetables, whole fruits, and nuts. Avoid high-calorie and high-sugar snacks, such as potato chips, fruit snacks, and candy.  Use oil-based dressings and spreads on foods instead of solid fats such as butter, stick margarine, or cream cheese.  Limit pre-made sauces, mixes, and "instant" products such as flavored rice, instant noodles, and ready-made pasta.  Try more plant-protein sources, such as tofu, tempeh, black beans,  edamame, lentils, nuts, and seeds.  Explore eating plans such as the Mediterranean diet or vegetarian diet. Cooking  Use oil to saut or stir-fry foods instead of solid fats such as butter, stick margarine, or lard.  Try baking, boiling, grilling, or broiling instead of frying.  Remove the fatty part of meats before cooking.  Steam vegetables in water or broth. Meal planning   At meals, imagine dividing your plate into fourths: ? One-half of your plate is fruits and vegetables. ? One-fourth of your plate is whole grains. ? One-fourth of your plate is protein, especially lean meats, poultry, eggs, tofu, beans, or nuts.  Include low-fat dairy as part of your daily diet. Lifestyle  Choose healthy options in all settings, including home, work, school, restaurants, or stores.  Prepare your food safely: ? Wash your hands after handling raw meats. ? Keep food preparation surfaces clean by regularly washing with hot, soapy water. ? Keep raw meats separate from ready-to-eat foods, such as fruits and vegetables. ? Cook seafood, meat, poultry, and eggs to the recommended internal temperature. ? Store foods at safe temperatures. In general:  Keep cold foods at 59F (4.4C) or below.  Keep hot foods at 159F (60C) or above.  Keep your freezer at [facility] (-17.8C) or below.  Foods are no longer safe to eat when they have been between the temperatures of 40-159F (4.4-60C) for more than 2 hours. What foods should I eat? Fruits Aim to eat 2 cup-equivalents of fresh, canned (in natural juice), or frozen fruits each day. Examples of 1 cup-equivalent of fruit include 1 small apple, 8 large strawberries, 1 cup canned fruit,  cup  dried fruit, or 1 cup 100% juice. Vegetables Aim to eat 2-3 cup-equivalents of fresh and frozen vegetables each day, including different varieties and colors. Examples of 1 cup-equivalent of vegetables include 2 medium carrots, 2 cups raw, leafy greens, 1 cup chopped  vegetable (raw or cooked), or 1 medium baked potato. Grains Aim to eat 6 ounce-equivalents of whole grains each day. Examples of 1 ounce-equivalent of grains include 1 slice of bread, 1 cup ready-to-eat cereal, 3 cups popcorn, or  cup cooked rice, pasta, or cereal. Meats and other proteins Aim to eat 5-6 ounce-equivalents of protein each day. Examples of 1 ounce-equivalent of protein include 1 egg, 1/2 cup nuts or seeds, or 1 tablespoon (16 g) peanut butter. A cut of meat or fish that is the size of a deck of cards is about 3-4 ounce-equivalents.  Of the protein you eat each week, try to have at least 8 ounces come from seafood. This includes salmon, trout, herring, and anchovies. Dairy Aim to eat 3 cup-equivalents of fat-free or low-fat dairy each day. Examples of 1 cup-equivalent of dairy include 1 cup (240 mL) milk, 8 ounces (250 g) yogurt, 1 ounces (44 g) natural cheese, or 1 cup (240 mL) fortified soy milk. Fats and oils  Aim for about 5 teaspoons (21 g) per day. Choose monounsaturated fats, such as canola and olive oils, avocados, peanut butter, and most nuts, or polyunsaturated fats, such as sunflower, corn, and soybean oils, walnuts, pine nuts, sesame seeds, sunflower seeds, and flaxseed. Beverages  Aim for six 8-oz glasses of water per day. Limit coffee to three to five 8-oz cups per day.  Limit caffeinated beverages that have added calories, such as soda and energy drinks.  Limit alcohol intake to no more than 1 drink a day for nonpregnant women and 2 drinks a day for men. One drink equals 12 oz of beer (355 mL), 5 oz of wine (148 mL), or 1 oz of hard liquor (44 mL). Seasoning and other foods  Avoid adding excess amounts of salt to your foods. Try flavoring foods with herbs and spices instead of salt.  Avoid adding sugar to foods.  Try using oil-based dressings, sauces, and spreads instead of solid fats. This information is based on general [place] nutrition guidelines. For more  information, visit [employer]. Exact amounts may vary based on your nutrition needs. Summary  A healthy eating plan may help you to maintain a healthy weight, reduce the risk of chronic diseases, and stay active throughout your life.  Plan your meals. Make sure you eat the right portions of a variety of nutrient-rich foods.  Try baking, boiling, grilling, or broiling instead of frying.  Choose healthy options in all settings, including home, work, school, restaurants, or stores. This information is not intended to replace advice given to you by your health care provider. Make sure you discuss any questions you have with your health care provider. Document Revised: 11/19/2017 Document Reviewed: 11/19/2017 Elsevier Patient Education  [place].

## 2020-06-09 NOTE — Assessment & Plan Note (Signed)
Chronic, stable with BP at goal today.  Continue monitoring BP at home 3 mornings a week and documenting for provider.  Will monitor and initiate medications if consistently elevated.  CMP and TSH + CBC today.  Return in 6 months.

## 2020-06-09 NOTE — Assessment & Plan Note (Addendum)
Noted on DEXA March 2021.  She is taking daily Calcium and Vit D3, to continue this.  Continue Fosamax, educated her at length on this and provided information to her.  Repeat DEXA in 2023.  Monitor for fractures or falls.  Vit D level today.

## 2020-06-09 NOTE — Assessment & Plan Note (Signed)
Chronic, stable with Prilosec.  Denies any symptoms at this time and no use of TUMS.  Continue current medication regimen and adjust as needed.  Mag level today.

## 2020-06-09 NOTE — Assessment & Plan Note (Signed)
Praised for ongoing focus on healthy diet and weight loss -- has lost over 30 pounds since March 2020 and now wearing size 14.  Recommended eating smaller high protein, low fat meals more frequently and exercising 30 mins a day 5 times a week with a goal of 10-15lb weight loss in the next 3 months. Patient voiced their understanding and motivation to adhere to these recommendations.

## 2020-06-10 LAB — COMPREHENSIVE METABOLIC PANEL
ALT: 11 IU/L (ref 0–32)
AST: 17 IU/L (ref 0–40)
Albumin/Globulin Ratio: 1.8 (ref 1.2–2.2)
Albumin: 4.3 g/dL (ref 3.7–4.7)
Alkaline Phosphatase: 51 IU/L (ref 44–121)
BUN/Creatinine Ratio: 16 (ref 12–28)
BUN: 10 mg/dL (ref 8–27)
Bilirubin Total: 0.9 mg/dL (ref 0.0–1.2)
CO2: 24 mmol/L (ref 20–29)
Calcium: 9 mg/dL (ref 8.7–10.3)
Chloride: 103 mmol/L (ref 96–106)
Creatinine, Ser: 0.63 mg/dL (ref 0.57–1.00)
GFR calc Af Amer: 104 mL/min/{1.73_m2} (ref >59)
GFR calc non Af Amer: 90 mL/min/{1.73_m2} (ref >59)
Globulin, Total: 2.4 g/dL (ref 1.5–4.5)
Glucose: 85 mg/dL (ref 65–99)
Potassium: 4.2 mmol/L (ref 3.5–5.2)
Sodium: 140 mmol/L (ref 134–144)
Total Protein: 6.7 g/dL (ref 6.0–8.5)

## 2020-06-10 LAB — LIPID PANEL W/O CHOL/HDL RATIO
Cholesterol, Total: 155 mg/dL (ref 100–199)
HDL: 57 mg/dL (ref >39)
LDL Chol Calc (NIH): 83 mg/dL (ref 0–99)
Triglycerides: 76 mg/dL (ref 0–149)
VLDL Cholesterol Cal: 15 mg/dL (ref 5–40)

## 2020-06-10 LAB — CBC WITH DIFFERENTIAL/PLATELET
Basophils Absolute: 0 10*3/uL (ref 0.0–0.2)
Basos: 1 %
EOS (ABSOLUTE): 0.1 10*3/uL (ref 0.0–0.4)
Eos: 2 %
Hematocrit: 38.3 % (ref 34.0–46.6)
Hemoglobin: 12.9 g/dL (ref 11.1–15.9)
Immature Grans (Abs): 0 10*3/uL (ref 0.0–0.1)
Immature Granulocytes: 0 %
Lymphocytes Absolute: 1.7 10*3/uL (ref 0.7–3.1)
Lymphs: 35 %
MCH: 33.9 pg — ABNORMAL HIGH (ref 26.6–33.0)
MCHC: 33.7 g/dL (ref 31.5–35.7)
MCV: 101 fL — ABNORMAL HIGH (ref 79–97)
Monocytes Absolute: 0.3 10*3/uL (ref 0.1–0.9)
Monocytes: 7 %
Neutrophils Absolute: 2.7 10*3/uL (ref 1.4–7.0)
Neutrophils: 55 %
Platelets: 372 10*3/uL (ref 150–450)
RBC: 3.81 x10E6/uL (ref 3.77–5.28)
RDW: 12.8 % (ref 11.7–15.4)
WBC: 4.8 10*3/uL (ref 3.4–10.8)

## 2020-06-10 LAB — MAGNESIUM: Magnesium: 2.3 mg/dL (ref 1.6–2.3)

## 2020-06-10 LAB — TSH: TSH: 0.828 u[IU]/mL (ref 0.450–4.500)

## 2020-06-10 LAB — VITAMIN D 25 HYDROXY (VIT D DEFICIENCY, FRACTURES): Vit D, 25-Hydroxy: 36.9 ng/mL (ref 30.0–100.0)

## 2020-06-10 NOTE — Progress Notes (Signed)
Contacted via MyChart  Good afternoon Emily Watson, your labs have returned and are FABULOUS!!  WOW!!  Overall these look fantastic and you can continue all current medications, no changes needed.  Keep up the amazing work!! Keep being awesome!!  Thank you for allowing me to participate in your care. Kindest regards, Danielys Madry

## 2020-06-30 ENCOUNTER — Ambulatory Visit: Payer: Medicare Other

## 2020-07-02 ENCOUNTER — Ambulatory Visit (INDEPENDENT_AMBULATORY_CARE_PROVIDER_SITE_OTHER): Payer: Medicare Other

## 2020-07-02 VITALS — Ht 60.0 in | Wt 155.0 lb

## 2020-07-02 DIAGNOSIS — Z0000 Encounter for general adult medical examination without abnormal findings: Secondary | ICD-10-CM | POA: Diagnosis not present

## 2020-07-02 NOTE — Progress Notes (Signed)
I connected with Emily Watson today by telephone and verified that I am speaking with the correct person using two identifiers. Location patient: home Location provider: work Persons participating in the virtual visit: Angalena Cousineau, Elisha Ponder LPN.   I discussed the limitations, risks, security and privacy concerns of performing an evaluation and management service by telephone and the availability of in person appointments. I also discussed with the patient that there may be a patient responsible charge related to this service. The patient expressed understanding and verbally consented to this telephonic visit.    Interactive audio and video telecommunications were attempted between this provider and patient, however failed, due to patient having technical difficulties OR patient did not have access to video capability.  We continued and completed visit with audio only.     Vital signs may be patient reported or missing.  Subjective:   Emily Watson is a 74 y.o. female who presents for Medicare Annual (Subsequent) preventive examination.  Review of Systems     Cardiac Risk Factors include: advanced age (>21men, >22 women);obesity (BMI >30kg/m2)     Objective:    Today's Vitals   07/02/20 0856  Weight: 155 lb (70.3 kg)  Height: 5' (1.524 m)   Body mass index is 30.27 kg/m.  Advanced Directives 07/02/2020 06/26/2019 06/18/2017 03/27/2017 03/14/2017  Does Patient Have a Medical Advance Directive? No No No No No  Does patient want to make changes to medical advance directive? - - Yes (MAU/Ambulatory/Procedural Areas - Information given) - -  Would patient like information on creating a medical advance directive? - - - No - Patient declined -    Current Medications (verified) Outpatient Encounter Medications as of 07/02/2020  Medication Sig  . alendronate (FOSAMAX) 70 MG tablet Take 1 tablet (70 mg total) by mouth every 7 (seven) days. Take with a full glass of water on an  empty stomach.  Marland Kitchen aspirin (ASPIRIN EC) 81 MG EC tablet Take 81 mg by mouth daily. Swallow whole.  Marland Kitchen atorvastatin (LIPITOR) 10 MG tablet Take 1 tablet (10 mg total) by mouth daily at 6 PM.  . busPIRone (BUSPAR) 5 MG tablet Take 1 tablet (5 mg total) by mouth 2 (two) times daily as needed. for anxiety  . Cholecalciferol (VITAMIN D3 PO) Take by mouth daily. 2000 IU ( )  . omeprazole (PRILOSEC OTC) 20 MG tablet Take 1 tablet (20 mg total) by mouth daily.  . traZODone (DESYREL) 50 MG tablet Take 1 tablet (50 mg total) by mouth at bedtime.   No facility-administered encounter medications on file as of 07/02/2020.    Allergies (verified) Codeine, Chlorpheniramine-phenylephrine, Gabapentin, Other, Peppermint oil, and Triprolidine-pseudoephedrine   History: Past Medical History:  Diagnosis Date  . Allergy   . Anaphylaxis   . Anxiety   . Arthritis   . Carpal tunnel syndrome   . Depression   . GERD (gastroesophageal reflux disease)   . Hypertension    patient denies  . Peripheral edema   . Trigger thumb of left hand    Past Surgical History:  Procedure Laterality Date  . CARPAL TUNNEL RELEASE    . HERNIA REPAIR  04/2009  . ROTATOR CUFF REPAIR Left   . TOTAL KNEE ARTHROPLASTY Left 03/27/2017   Procedure: TOTAL KNEE ARTHROPLASTY - LEFT;  Surgeon: Kennedy Bucker, MD;  Location: [facility] ORS;  Service: Orthopedics;  Laterality: Left;   Family History  Problem Relation Age of Onset  . Kidney disease Mother   . Cancer Mother   .  Kidney disease Father   . Cancer Father   . Cancer Sister        bone  . Hypertension Sister   . Osteoporosis Sister   . Breast cancer Sister   . Other Son 41       brain tumor  . Alzheimer's disease Sister   . Hypertension Brother    Social History   Socioeconomic History  . Marital status: Divorced    Spouse name: Not on file  . Number of children: Not on file  . Years of education: Not on file  . Highest education level: High school graduate    Occupational History  . Occupation: retired  Tobacco Use  . Smoking status: Former Smoker    Quit date: 03/14/1997    Years since quitting: 23.3  . Smokeless tobacco: Never Used  Vaping Use  . Vaping Use: Never used  Substance and Sexual Activity  . Alcohol use: No    Alcohol/week: 0.0 standard drinks  . Drug use: No  . Sexual activity: Not on file  Other Topics Concern  . Not on file  Social History Narrative  . Not on file   Social Determinants of Health   Financial Resource Strain: Low Risk   . Difficulty of Paying Living Expenses: Not hard at all  Food Insecurity: No Food Insecurity  . Worried About Programme researcher, broadcasting/film/video in the Last Year: Never true  . Ran Out of Food in the Last Year: Never true  Transportation Needs: No Transportation Needs  . Lack of Transportation (Medical): No  . Lack of Transportation (Non-Medical): No  Physical Activity: Insufficiently Active  . Days of Exercise per Week: 3 days  . Minutes of Exercise per Session: 30 min  Stress: No Stress Concern Present  . Feeling of Stress : Not at all  Social Connections:   . Frequency of Communication with Friends and Family: Not on file  . Frequency of Social Gatherings with Friends and Family: Not on file  . Attends Religious Services: Not on file  . Active Member of Clubs or Organizations: Not on file  . Attends Banker Meetings: Not on file  . Marital Status: Not on file    Tobacco Counseling Counseling given: Not Answered   Clinical Intake:  Pre-visit preparation completed: Yes  Pain : No/denies pain     Nutritional Status: BMI > 30  Obese Nutritional Risks: None Diabetes: No  How often do you need to have someone help you when you read instructions, pamphlets, or other written materials from your doctor or pharmacy?: 1 - Never What is the last grade level you completed in school?: 12th grade  Diabetic? no  Interpreter Needed?: No  Information entered by :: NAllen  LPN   Activities of Daily Living In your present state of health, do you have any difficulty performing the following activities: 07/02/2020 06/09/2020  Hearing? N N  Comment has ringing in ear -  Vision? N N  Difficulty concentrating or making decisions? N N  Walking or climbing stairs? N N  Dressing or bathing? N N  Doing errands, shopping? N N  Preparing Food and eating ? N -  Using the Toilet? N -  In the past six months, have you accidently leaked urine? N -  Do you have problems with loss of bowel control? N -  Managing your Medications? N -  Managing your Finances? N -  Housekeeping or managing your Housekeeping? N -  Some recent  data might be hidden    Patient Care Team: Marjie Skiff, NP as PCP - General (Nurse Practitioner) Kennedy Bucker, MD as Consulting Physician (Orthopedic Surgery)  Indicate any recent Medical Services you may have received from other than [facility] providers in the past year (date may be approximate).     Assessment:   This is a routine wellness examination for Amani.  Hearing/Vision screen  Hearing Screening   125Hz  250Hz  500Hz  1000Hz  2000Hz  3000Hz  4000Hz  6000Hz  8000Hz   Right ear:           Left ear:           Vision Screening Comments: Regular eye exams,   Dietary issues and exercise activities discussed: Current Exercise Habits: Home exercise routine, Type of exercise: calisthenics, Time (Minutes): 30, Frequency (Times/Week): 3, Weekly Exercise (Minutes/Week): 90  Goals    . DIET - INCREASE WATER INTAKE     Recommend drinking 6-8 glasses of water per day.     . Patient Stated     07/02/2020, no goals      Depression Screen PHQ 2/9 Scores 07/02/2020 06/09/2020 12/09/2019 06/26/2019 06/10/2019 03/04/2019 01/30/2019  PHQ - 2 Score 0 0 0 1 0 1 1  PHQ- 9 Score 0 0 0 - 1 1 2     Fall Risk Fall Risk  07/02/2020 06/09/2020 06/26/2019 10/22/2018 07/09/2018  Falls in the past year? 0 0 0 0 0  Comment - - - - Emmi Telephone Survey: data to  providers prior to load  Number falls in past yr: - 0 0 0 -  Injury with Fall? - 0 0 0 -  Risk for fall due to : Medication side effect No Fall Risks - - -  Follow up Falls evaluation completed;Education provided;Falls prevention discussed Falls evaluation completed - - -    Any stairs in or around the home? Yes  If so, are there any without handrails? No  Home free of loose throw rugs in walkways, pet beds, electrical cords, etc? Yes  Adequate lighting in your home to reduce risk of falls? Yes   ASSISTIVE DEVICES UTILIZED TO PREVENT FALLS:  Life alert? No  Use of a cane, walker or w/c? Yes  Grab bars in the bathroom? Yes  Shower chair or bench in shower? No  Elevated toilet seat or a handicapped toilet? Yes   TIMED UP AND GO:  Was the test performed? No .      Cognitive Function:     6CIT Screen 07/02/2020 06/09/2020 06/19/2018 06/18/2017  What Year? 0 points 0 points 0 points 0 points  What month? 0 points 0 points 0 points 0 points  What time? 0 points 0 points 0 points 0 points  Count back from 20 0 points 0 points 0 points 0 points  Months in reverse 0 points 2 points 0 points 0 points  Repeat phrase 2 points 10 points 0 points 4 points  Total Score 2 12 0 4    Immunizations Immunization History  Administered Date(s) Administered  . Fluad Quad(high Dose 65+) 06/10/2019, 06/09/2020  . Influenza, High Dose Seasonal PF 06/08/2016, 05/21/2017, 04/29/2018  . Influenza,inj,Quad PF,6+ Mos 06/04/2015  . Moderna SARS-COVID-2 Vaccination 11/07/2019, 12/05/2019  . Pneumococcal Conjugate-13 07/20/2014  . Pneumococcal Polysaccharide-23 08/03/2015  . Td 08/21/2005    TDAP status: Due, Education has been provided regarding the importance of this vaccine. Advised may receive this vaccine at local pharmacy or Health Dept. Aware to provide a copy of the vaccination record if  obtained from local pharmacy or Health Dept. Verbalized acceptance and understanding. Flu Vaccine  status: Up to date Pneumococcal vaccine status: Up to date Covid-19 vaccine status: Completed vaccines  Qualifies for Shingles Vaccine? Yes   Zostavax completed No   Shingrix Completed?: No.    Education has been provided regarding the importance of this vaccine. Patient has been advised to call insurance company to determine out of pocket expense if they have not yet received this vaccine. Advised may also receive vaccine at local pharmacy or Health Dept. Verbalized acceptance and understanding.  Screening Tests Health Maintenance  Topic Date Due  . TETANUS/TDAP  08/22/2015  . COLONOSCOPY  06/09/2021 (Originally 05/07/1998)  . MAMMOGRAM  11/03/2021  . INFLUENZA VACCINE  Completed  . DEXA SCAN  Completed  . COVID-19 Vaccine  Completed  . Hepatitis C Screening  Completed  . PNA vac Low Risk Adult  Completed    Health Maintenance  Health Maintenance Due  Topic Date Due  . TETANUS/TDAP  08/22/2015    Colorectal cancer screening: decline Mammogram status: Completed 11/04/2019. Repeat every year Bone Density status: Completed 11/04/2019. Results reflect: Bone density results: OSTEOPOROSIS. Repeat every 2 years.  Lung Cancer Screening: (Low Dose CT Chest recommended if Age 58-80 years, 30 pack-year currently smoking OR have quit w/in 15years.) does not qualify.   Lung Cancer Screening Referral: no  Additional Screening:  Hepatitis C Screening: does qualify; Completed 03/07/2019  Vision Screening: Recommended annual ophthalmology exams for early detection of glaucoma and other disorders of the eye. Is the patient up to date with their annual eye exam?  Yes  Who is the provider or what is the name of the office in which the patient attends annual eye exams? Does not remember name If pt is not established with a provider, would they like to be referred to a provider to establish care? No .   Dental Screening: Recommended annual dental exams for proper oral hygiene  Community  Resource Referral / Chronic Care Management: CRR required this visit?  No   CCM required this visit?  No      Plan:     I have personally reviewed and noted the following in the patient's chart:   . Medical and social history . Use of alcohol, tobacco or illicit drugs  . Current medications and supplements . Functional ability and status . Nutritional status . Physical activity . Advanced directives . List of other physicians . Hospitalizations, surgeries, and ER visits in previous 12 months . Vitals . Screenings to include cognitive, depression, and falls . Referrals and appointments  In addition, I have reviewed and discussed with patient certain preventive protocols, quality metrics, and best practice recommendations. A written personalized care plan for preventive services as well as general preventive health recommendations were provided to patient.     Barb Merinoickeah E Samyra Limb, LPN   16/10/960411/07/2020   Nurse Notes:

## 2020-07-02 NOTE — Patient Instructions (Signed)
Emily Watson , Thank you for taking time to come for your Medicare Wellness Visit. I appreciate your ongoing commitment to your health goals. Please review the following plan we discussed and let me know if I can assist you in the future.   Screening recommendations/referrals: Colonoscopy: decline Mammogram: completed 11/04/2019, due 11/03/2020 Bone Density: completed 11/04/2019, due 11/03/2021 Recommended yearly ophthalmology/optometry visit for glaucoma screening and checkup Recommended yearly dental visit for hygiene and checkup  Vaccinations: Influenza vaccine: completed 06/09/2020 Pneumococcal vaccine: completed 08/03/2015 Tdap vaccine: decline Shingles vaccine: discussed   Covid-19: 11/07/2019, 12/05/2019  Advanced directives: Advance directive discussed with you today. Even though you declined this today please call our office should you change your mind and we can give you the proper paperwork for you to fill out.   Conditions/risks identified: none  Next appointment: Follow up in one year for your annual wellness visit    Preventive Care 72 Years and Older, Female Preventive care refers to lifestyle choices and visits with your health care provider that can promote health and wellness. What does preventive care include?  A yearly physical exam. This is also called an annual well check.  Dental exams once or twice a year.  Routine eye exams. Ask your health care provider how often you should have your eyes checked.  Personal lifestyle choices, including:  Daily care of your teeth and gums.  Regular physical activity.  Eating a healthy diet.  Avoiding tobacco and drug use.  Limiting alcohol use.  Practicing safe sex.  Taking low-dose aspirin every day.  Taking vitamin and mineral supplements as recommended by your health care provider. What happens during an annual well check? The services and screenings done by your health care provider during your annual well  check will depend on your age, overall health, lifestyle risk factors, and family history of disease. Counseling  Your health care provider may ask you questions about your:  Alcohol use.  Tobacco use.  Drug use.  Emotional well-being.  Home and relationship well-being.  Sexual activity.  Eating habits.  History of falls.  Memory and ability to understand (cognition).  Work and work Astronomer.  Reproductive health. Screening  You may have the following tests or measurements:  Height, weight, and BMI.  Blood pressure.  Lipid and cholesterol levels. These may be checked every 5 years, or more frequently if you are over 56 years old.  Skin check.  Lung cancer screening. You may have this screening every year starting at age 34 if you have a 30-pack-year history of smoking and currently smoke or have quit within the past 15 years.  Fecal occult blood test (FOBT) of the stool. You may have this test every year starting at age 40.  Flexible sigmoidoscopy or colonoscopy. You may have a sigmoidoscopy every 5 years or a colonoscopy every 10 years starting at age 43.  Hepatitis C blood test.  Hepatitis B blood test.  Sexually transmitted disease (STD) testing.  Diabetes screening. This is done by checking your blood sugar (glucose) after you have not eaten for a while (fasting). You may have this done every 1-3 years.  Bone density scan. This is done to screen for osteoporosis. You may have this done starting at age 27.  Mammogram. This may be done every 1-2 years. Talk to your health care provider about how often you should have regular mammograms. Talk with your health care provider about your test results, treatment options, and if necessary, the need for more tests.  Vaccines  Your health care provider may recommend certain vaccines, such as:  Influenza vaccine. This is recommended every year.  Tetanus, diphtheria, and acellular pertussis (Tdap, Td) vaccine. You  may need a Td booster every 10 years.  Zoster vaccine. You may need this after age 41.  Pneumococcal 13-valent conjugate (PCV13) vaccine. One dose is recommended after age 63.  Pneumococcal polysaccharide (PPSV23) vaccine. One dose is recommended after age 49. Talk to your health care provider about which screenings and vaccines you need and how often you need them. This information is not intended to replace advice given to you by your health care provider. Make sure you discuss any questions you have with your health care provider. Document Released: 09/03/2015 Document Revised: 04/26/2016 Document Reviewed: 06/08/2015 Elsevier Interactive Patient Education  2017 [place] Prevention in the Home Falls can cause injuries. They can happen to people of all ages. There are many things you can do to make your home safe and to help prevent falls. What can I do on the outside of my home?  Regularly fix the edges of walkways and driveways and fix any cracks.  Remove anything that might make you trip as you walk through a door, such as a raised step or threshold.  Trim any bushes or trees on the path to your home.  Use bright outdoor lighting.  Clear any walking paths of anything that might make someone trip, such as rocks or tools.  Regularly check to see if handrails are loose or broken. Make sure that both sides of any steps have handrails.  Any raised decks and porches should have guardrails on the edges.  Have any leaves, snow, or ice cleared regularly.  Use sand or salt on walking paths during winter.  Clean up any spills in your garage right away. This includes oil or grease spills. What can I do in the bathroom?  Use night lights.  Install grab bars by the toilet and in the tub and shower. Do not use towel bars as grab bars.  Use non-skid mats or decals in the tub or shower.  If you need to sit down in the shower, use a plastic, non-slip stool.  Keep the floor  dry. Clean up any water that spills on the floor as soon as it happens.  Remove soap buildup in the tub or shower regularly.  Attach bath mats securely with double-sided non-slip rug tape.  Do not have throw rugs and other things on the floor that can make you trip. What can I do in the bedroom?  Use night lights.  Make sure that you have a light by your bed that is easy to reach.  Do not use any sheets or blankets that are too big for your bed. They should not hang down onto the floor.  Have a firm chair that has side arms. You can use this for support while you get dressed.  Do not have throw rugs and other things on the floor that can make you trip. What can I do in the kitchen?  Clean up any spills right away.  Avoid walking on wet floors.  Keep items that you use a lot in easy-to-reach places.  If you need to reach something above you, use a strong step stool that has a grab bar.  Keep electrical cords out of the way.  Do not use floor polish or wax that makes floors slippery. If you must use wax, use non-skid floor wax.  Do not have throw rugs and other things on the floor that can make you trip. What can I do with my stairs?  Do not leave any items on the stairs.  Make sure that there are handrails on both sides of the stairs and use them. Fix handrails that are broken or loose. Make sure that handrails are as long as the stairways.  Check any carpeting to make sure that it is firmly attached to the stairs. Fix any carpet that is loose or worn.  Avoid having throw rugs at the top or bottom of the stairs. If you do have throw rugs, attach them to the floor with carpet tape.  Make sure that you have a light switch at the top of the stairs and the bottom of the stairs. If you do not have them, ask someone to add them for you. What else can I do to help prevent falls?  Wear shoes that:  Do not have high heels.  Have rubber bottoms.  Are comfortable and fit you  well.  Are closed at the toe. Do not wear sandals.  If you use a stepladder:  Make sure that it is fully opened. Do not climb a closed stepladder.  Make sure that both sides of the stepladder are locked into place.  Ask someone to hold it for you, if possible.  Clearly mark and make sure that you can see:  Any grab bars or handrails.  First and last steps.  Where the edge of each step is.  Use tools that help you move around (mobility aids) if they are needed. These include:  Canes.  Walkers.  Scooters.  Crutches.  Turn on the lights when you go into a dark area. Replace any light bulbs as soon as they burn out.  Set up your furniture so you have a clear path. Avoid moving your furniture around.  If any of your floors are uneven, fix them.  If there are any pets around you, be aware of where they are.  Review your medicines with your doctor. Some medicines can make you feel dizzy. This can increase your chance of falling. Ask your doctor what other things that you can do to help prevent falls. This information is not intended to replace advice given to you by your health care provider. Make sure you discuss any questions you have with your health care provider. Document Released: 06/03/2009 Document Revised: 01/13/2016 Document Reviewed: 09/11/2014 Elsevier Interactive Patient Education  2017 [employer].

## 2020-10-26 ENCOUNTER — Other Ambulatory Visit: Payer: Self-pay | Admitting: Nurse Practitioner

## 2020-10-26 DIAGNOSIS — Z1231 Encounter for screening mammogram for malignant neoplasm of breast: Secondary | ICD-10-CM

## 2020-10-28 ENCOUNTER — Other Ambulatory Visit: Payer: Self-pay | Admitting: Nurse Practitioner

## 2020-11-12 ENCOUNTER — Ambulatory Visit
Admission: RE | Admit: 2020-11-12 | Discharge: 2020-11-12 | Disposition: A | Discharge status: Home / Self Care | Payer: Medicare Other | Source: Ambulatory Visit | Attending: Nurse Practitioner | Admitting: Nurse Practitioner

## 2020-11-12 ENCOUNTER — Other Ambulatory Visit: Payer: Self-pay

## 2020-11-12 DIAGNOSIS — Z1231 Encounter for screening mammogram for malignant neoplasm of breast: Secondary | ICD-10-CM | POA: Insufficient documentation

## 2020-12-08 ENCOUNTER — Telehealth (INDEPENDENT_AMBULATORY_CARE_PROVIDER_SITE_OTHER): Payer: Medicare Other | Admitting: Nurse Practitioner

## 2020-12-08 ENCOUNTER — Encounter: Payer: Self-pay | Admitting: Nurse Practitioner

## 2020-12-08 ENCOUNTER — Other Ambulatory Visit: Payer: Self-pay

## 2020-12-08 VITALS — BP 119/61 | HR 54

## 2020-12-08 DIAGNOSIS — F5101 Primary insomnia: Secondary | ICD-10-CM | POA: Diagnosis not present

## 2020-12-08 DIAGNOSIS — M7918 Myalgia, other site: Secondary | ICD-10-CM

## 2020-12-08 DIAGNOSIS — I10 Essential (primary) hypertension: Secondary | ICD-10-CM

## 2020-12-08 DIAGNOSIS — M810 Age-related osteoporosis without current pathological fracture: Secondary | ICD-10-CM

## 2020-12-08 DIAGNOSIS — E7800 Pure hypercholesterolemia, unspecified: Secondary | ICD-10-CM

## 2020-12-08 DIAGNOSIS — F321 Major depressive disorder, single episode, moderate: Secondary | ICD-10-CM | POA: Diagnosis not present

## 2020-12-08 DIAGNOSIS — F411 Generalized anxiety disorder: Secondary | ICD-10-CM | POA: Diagnosis not present

## 2020-12-08 DIAGNOSIS — K219 Gastro-esophageal reflux disease without esophagitis: Secondary | ICD-10-CM | POA: Diagnosis not present

## 2020-12-08 MED ORDER — TIZANIDINE HCL 4 MG PO TABS: 4.0000 mg | ORAL_TABLET | Freq: Four times a day (QID) | ORAL | 1 refills | Status: DC | PRN

## 2020-12-08 MED ORDER — PREDNISONE 20 MG PO TABS: 40.0000 mg | ORAL_TABLET | Freq: Every day | ORAL | 0 refills | Status: AC

## 2020-12-08 NOTE — Assessment & Plan Note (Signed)
Chronic, improved with Trazodone.  Continue current medication which benefits mood and sleep.  Adjust as needed.  Refills up to date.

## 2020-12-08 NOTE — Progress Notes (Signed)
BP 119/61   Pulse (!) 54   LMP 02/01/1991 (Approximate)    Subjective:    Patient ID: Emily Watson, female    DOB: [birth year], 75 y.o.   MRN: [id number]  HPI: Emily Watson is a 75 y.o. female  Chief Complaint  Patient presents with  . Hyperlipidemia  . Mood  . Hypertension  . Shoulder Pain    Patient states she is having pain in the left arm from the elbow up to mid deltoid. Patient states "it feels like someone is punching her in the spot and she is not able to lift her arm completely."    . This visit was completed via video visit through MyChart due to the restrictions of the COVID-19 pandemic. All issues as above were discussed and addressed. Physical exam was done as above through visual confirmation on video through MyChart. If it was felt that the patient should be evaluated in the office, they were directed there. The patient verbally consented to this visit. . Location of the patient: home . Location of the provider: home . Those involved with this call:  . Provider: [name], DNP . CMA: Malen Gauze, CMA . Front Desk/Registration: Harriet Pho  . Time spent on call: 21 minutes with patient face to face via video conference. More than 50% of this time was spent in counseling and coordination of care. 15 minutes total spent in review of patient's record and preparation of their chart.  . I verified patient identity using two factors (patient name and date of birth). Patient consents verbally to being seen via telemedicine visit today.    HYPERTENSION / HYPERLIPIDEMIA Continues on Lipitor for cholesterol, is tolerating without ADR. No current BP medications. Has been focused on diet and weight loss., going from 187 pounds in March 2020 and recent visit 155 pounds.    She continues to take daily Omeprazole for GERD with no issues. Satisfied with current treatment?yes Duration of hypertension:chronic BP monitoring frequency:a couple times a week BP  range:110-120/70 range at home Duration of hyperlipidemia:chronic Cholesterol medication side effects:no Cholesterol supplements: none Medication compliance:good compliance Aspirin:yes Recent stressors:no Recurrent headaches:no Visual changes:no Palpitations:no Dyspnea:no Chest pain:no Lower extremity edema:no Dizzy/lightheaded:no  OSTEOPOROSIS Last DEXA scan was March 2021 -- did note osteoporosis with T Score -3.0. Started on Fosamax at that time and has tolerated. She is taking daily supplements, Vit D and Calcium. Satisfied with current treatment?:yes Medication side effects:no Medication compliance:good compliance Past osteoporosis medications/treatments:  Adequate calcium & vitamin D:yes Intolerance to bisphosphonates:no Weight bearing exercises:yes  SHOULDER PAIN To deltoid area of left shoulder, can not lay on that side.  Had been doing a lot of lifting around home, as is preparing to move.  Is right hand dominant.  Started hurting 2-3 months ago.  Reports it is not as severe as was.  Denies any CP or SOB.  Feels like someone punched her in deltoid.  History of rotator cuff repair in past to this shoulder. Duration: months Involved shoulder: left Mechanism of injury: lots of lifting lately Location: anterior Onset:sudden Severity: 8/10 at worst Quality: started out sharp, now more dull and aching Frequency: intermittent  Radiation: no Aggravating factors: lifting and laying down at night Alleviating factors: heat Status: stable Treatments attempted: heat  Relief with NSAIDs?:  No NSAIDs Taken Weakness: no Numbness: no Decreased grip strength: no Redness: no Swelling: no Bruising: no Fevers: no  DEPRESSION/ANXIETY Continues onTrazodone50 MG at night+Buspar 5 MGas needed.  Mood status:stable Satisfied with current  treatment?:yes Symptom severity:mild Duration of current treatment :chronic Side effects:no Medication  compliance:good compliance Psychotherapy/counseling:none Depressed mood:yes Anxious mood:yes Anhedonia:no Significant weight loss or gain:no Insomnia:none improved with Trazodone Fatigue:no Feelings of worthlessness or guilt:no Impaired concentration/indecisiveness:no Suicidal ideations:no Hopelessness:no Crying spells:no [facility] screen [facility] 12/08/2020 07/02/2020 06/09/2020 12/09/2019 06/26/2019  Decreased Interest 0 0 0 0 0  Down, Depressed, Hopeless 0 0 0 0 1  PHQ - 2 Score 0 0 0 0 1  Altered sleeping 0 0 0 0 -  Tired, decreased energy 1 0 0 0 -  Change in appetite 0 0 0 0 -  Feeling bad or failure about yourself  0 0 0 0 -  Trouble concentrating 0 0 0 0 -  Moving slowly or fidgety/restless 1 0 0 0 -  Suicidal thoughts 0 0 0 0 -  PHQ-9 Score 2 0 0 0 -  Difficult doing work/chores - Not difficult at all Not difficult at all Not difficult at all -  Some recent data might be hidden   GAD 7 : Generalized Anxiety Score 12/08/2020 12/09/2019 06/10/2019 03/04/2019  Nervous, Anxious, on Edge 1 1 1 1   Control/stop worrying 1 1 0 1  Worry too much - different things 0 0 0 1  Trouble relaxing 0 1 0 0  Restless 0 0 0 0  Easily annoyed or irritable 0 0 0 1  Afraid - awful might happen 0 0 0 1  Total GAD 7 Score 2 3 1 5   Anxiety Difficulty Not difficult at all Not difficult at all Not difficult at all -   Relevant past medical, surgical, family and social history reviewed and updated as indicated. Interim medical history since our last visit reviewed. Allergies and medications reviewed and updated.  Review of Systems  Constitutional: Negative for activity change, appetite change, diaphoresis, fatigue and fever.  Respiratory: Negative for cough, chest tightness and shortness of breath.   Cardiovascular: Negative for chest pain, palpitations and leg swelling.  Gastrointestinal: Negative.   Musculoskeletal: Positive for arthralgias.  Neurological: Negative.    Psychiatric/Behavioral: Negative for decreased concentration, self-injury, sleep disturbance and suicidal ideas. The patient is nervous/anxious.     Per HPI unless specifically indicated above     Objective:    BP 119/61   Pulse (!) 54   LMP 02/01/1991 (Approximate)   Wt Readings from Last 3 Encounters:  07/02/20 155 lb (70.3 kg)  06/09/20 155 lb 12.8 oz (70.7 kg)  03/16/20 159 lb 6.4 oz (72.3 kg)    Physical Exam Vitals and nursing note reviewed.  Constitutional:      General: She is awake. She is not in acute distress.    Appearance: She is well-developed. She is not ill-appearing.  HENT:     Head: Normocephalic.     Right Ear: Hearing normal.     Left Ear: Hearing normal.  Eyes:     General: Lids are normal.        Right eye: No discharge.        Left eye: No discharge.     Conjunctiva/sclera: Conjunctivae normal.  Pulmonary:     Effort: Pulmonary effort is normal. No accessory muscle usage or respiratory distress.  Musculoskeletal:     Right shoulder: Normal.     Left shoulder: Tenderness (to deltoid area per patient) present. No swelling or bony tenderness. Normal range of motion.     Cervical back: Normal range of motion.     Comments: Tenderness with flexion and extension per  patient report.  No rashes reported.  Neurological:     Mental Status: She is alert and oriented to person, place, and time.  Psychiatric:        Attention and Perception: Attention normal.        Mood and Affect: Mood normal.        Behavior: Behavior normal. Behavior is cooperative.        Thought Content: Thought content normal.        Judgment: Judgment normal.     Results for orders placed or performed in visit on 06/09/20  CBC with Differential/Platelet  Result Value Ref Range   WBC 4.8 3.4 - 10.8 x10E3/uL   RBC 3.81 3.77 - 5.28 x10E6/uL   Hemoglobin 12.9 11.1 - 15.9 g/dL   Hematocrit 34.1 96.2 - 46.6 %   MCV 101 (H) 79 - 97 fL   MCH 33.9 (H) 26.6 - 33.0 pg   MCHC 33.7 31.5  - 35.7 g/dL   RDW 22.9 79.8 - 92.1 %   Platelets 372 150 - 450 x10E3/uL   Neutrophils 55 Not Estab. %   Lymphs 35 Not Estab. %   Monocytes 7 Not Estab. %   Eos 2 Not Estab. %   Basos 1 Not Estab. %   Neutrophils Absolute 2.7 1.4 - 7.0 x10E3/uL   Lymphocytes Absolute 1.7 0.7 - 3.1 x10E3/uL   Monocytes Absolute 0.3 0.1 - 0.9 x10E3/uL   EOS (ABSOLUTE) 0.1 0.0 - 0.4 x10E3/uL   Basophils Absolute 0.0 0.0 - 0.2 x10E3/uL   Immature Granulocytes 0 Not Estab. %   Immature Grans (Abs) 0.0 0.0 - 0.1 x10E3/uL  Comprehensive metabolic panel  Result Value Ref Range   Glucose 85 65 - 99 mg/dL   BUN 10 8 - 27 mg/dL   Creatinine, Ser 1.94 0.57 - 1.00 mg/dL   GFR calc non Af Amer 90 >59 mL/min/1.73   GFR calc Af Amer 104 >59 mL/min/1.73   BUN/Creatinine Ratio 16 12 - 28   Sodium 140 134 - 144 mmol/L   Potassium 4.2 3.5 - 5.2 mmol/L   Chloride 103 96 - 106 mmol/L   CO2 24 20 - 29 mmol/L   Calcium 9.0 8.7 - 10.3 mg/dL   Total Protein 6.7 6.0 - 8.5 g/dL   Albumin 4.3 3.7 - 4.7 g/dL   Globulin, Total 2.4 1.5 - 4.5 g/dL   Albumin/Globulin Ratio 1.8 1.2 - 2.2   Bilirubin Total 0.9 0.0 - 1.2 mg/dL   Alkaline Phosphatase 51 44 - 121 IU/L   AST 17 0 - 40 IU/L   ALT 11 0 - 32 IU/L  VITAMIN D 25 Hydroxy (Vit-D Deficiency, Fractures)  Result Value Ref Range   Vit D, 25-Hydroxy 36.9 30.0 - 100.0 ng/mL  TSH  Result Value Ref Range   TSH 0.828 0.450 - 4.500 uIU/mL  Lipid Panel w/o Chol/HDL Ratio  Result Value Ref Range   Cholesterol, Total 155 100 - 199 mg/dL   Triglycerides 76 0 - 149 mg/dL   HDL 57 >17 mg/dL   VLDL Cholesterol Cal 15 5 - 40 mg/dL   LDL Chol Calc (NIH) 83 0 - 99 mg/dL  Magnesium  Result Value Ref Range   Magnesium 2.3 1.6 - 2.3 mg/dL      Assessment & Plan:   Problem List Items Addressed This Visit      Cardiovascular and Mediastinum   Hypertension    Chronic, diet-controlled, stable with BP at goal recent visits and at home.  Continue monitoring BP at home 3 mornings a  week and documenting for provider.  Will monitor and initiate medications if consistently elevated.  CMP next visit.  Return in 6 months.        Digestive   GERD (gastroesophageal reflux disease)    Chronic, stable with Prilosec.  Denies any symptoms at this time and no use of TUMS. Continue current medication regimen and adjust as needed.  Mag level in October.        Musculoskeletal and Integument   Osteoporosis    Noted on DEXA March 2021.  She is taking daily Calcium and Vit D3, to continue this.  Continue Fosamax, educated her at length on this.  Repeat DEXA in 2023.  Monitor for fractures or falls.  Vit D level in October.        Other   Hypercholesteremia    Chronic, ongoing.  Continue current medication regimen and adjust as needed.  Lipid panel in 2 weeks.  Praised for ongoing weight loss.      Relevant Orders   Lipid Panel w/o Chol/HDL Ratio   Comprehensive metabolic panel   Generalized anxiety disorder    Chronic, ongoing.  Continue current medication regimen, has benefit from Buspar as needed.  Uses minimally.  Adjust regimen as needed.  Refills up to date.      Depression, major, single episode, moderate (HCC) - Primary    Chronic, stable on current medication regimen. Denies SI/HI.  Continue Trazodone which benefits sleep and mood + Buspar as needed.  Adjust regimen as needed based on mood and scores.  Refills up to date.  Return in 6 months.      Insomnia    Chronic, improved with Trazodone.  Continue current medication which benefits mood and sleep.  Adjust as needed.  Refills up to date.      Pain of left deltoid    History of surgeries to this shoulder, rotator cuff.  Suspect some arthritis present vs ?nerve impingement from recent heavy lifting.  Scripts for Tizanidine as needed + will provide a short burst of Prednisone, prefers not to take longer course.  She does not wish to attend PT due to cost, although would be beneficial for pain and ROM. At this time  recommend PT exercises at home and take Tylenol as needed.  Educated on Tylenol and max daily dose.  May also use Icy/Hot or Voltaren gel as needed.  Continue to use heat for discomfort.  Return to office in 2 weeks for follow-up.  If worsening obtain imaging and recommend PT or ortho visit.         I discussed the assessment and treatment plan with the patient. The patient was provided an opportunity to ask questions and all were answered. The patient agreed with the plan and demonstrated an understanding of the instructions.   The patient was advised to call back or seek an in-person evaluation if the symptoms worsen or if the condition fails to improve as anticipated.   I provided 21+ minutes of time during this encounter.  Follow up plan: Return in about 2 weeks (around 12/22/2020) for Left shoulder pain follow-up (has future labs ordered for this visit).

## 2020-12-08 NOTE — Assessment & Plan Note (Signed)
History of surgeries to this shoulder, rotator cuff.  Suspect some arthritis present vs ?nerve impingement from recent heavy lifting.  Scripts for Tizanidine as needed + will provide a short burst of Prednisone, prefers not to take longer course.  She does not wish to attend PT due to cost, although would be beneficial for pain and ROM. At this time recommend PT exercises at home and take Tylenol as needed.  Educated on Tylenol and max daily dose.  May also use Icy/Hot or Voltaren gel as needed.  Continue to use heat for discomfort.  Return to office in 2 weeks for follow-up.  If worsening obtain imaging and recommend PT or ortho visit.

## 2020-12-08 NOTE — Patient Instructions (Signed)
Shoulder Pain Many things can cause shoulder pain, including:  An injury.  Moving the shoulder in the same way again and again (overuse).  Joint pain (arthritis). Pain can come from:  Swelling and irritation (inflammation) of any part of the shoulder.  An injury to the shoulder joint.  An injury to: ? Tissues that connect muscle to bone (tendons). ? Tissues that connect bones to each other (ligaments). ? Bones. Follow these instructions at home: Watch for changes in your symptoms. Let your doctor know about them. Follow these instructions to help with your pain. If you have a sling:  Wear the sling as told by your doctor. Remove it only as told by your doctor.  Loosen the sling if your fingers: ? Tingle. ? Become numb. ? Turn cold and blue.  Keep the sling clean.  If the sling is not waterproof: ? Do not let it get wet. ? Take the sling off when you shower or bathe. Managing pain, stiffness, and swelling  If told, put ice on the painful area: ? Put ice in a plastic bag. ? Place a towel between your skin and the bag. ? Leave the ice on for 20 minutes, 2-3 times a day. Stop putting ice on if it does not help with the pain.  Squeeze a soft ball or a foam pad as much as possible. This prevents swelling in the shoulder. It also helps to strengthen the arm.   General instructions  Take over-the-counter and prescription medicines only as told by your doctor.  Keep all follow-up visits as told by your doctor. This is important. Contact a doctor if:  Your pain gets worse.  Medicine does not help your pain.  You have new pain in your arm, hand, or fingers. Get help right away if:  Your arm, hand, or fingers: ? Tingle. ? Are numb. ? Are swollen. ? Are painful. ? Turn white or blue. Summary  Shoulder pain can be caused by many things. These include injury, moving the shoulder in the same away again and again, and joint pain.  Watch for changes in your symptoms.  Let your doctor know about them.  This condition may be treated with a sling, ice, and pain medicine.  Contact your doctor if the pain gets worse or you have new pain. Get help right away if your arm, hand, or fingers tingle or get numb, swollen, or painful.  Keep all follow-up visits as told by your doctor. This is important. This information is not intended to replace advice given to you by your health care provider. Make sure you discuss any questions you have with your health care provider. Document Revised: 02/19/2018 Document Reviewed: 02/19/2018 Elsevier Patient Education  2021 Elsevier Inc.  

## 2020-12-08 NOTE — Assessment & Plan Note (Signed)
Noted on DEXA March 2021.  She is taking daily Calcium and Vit D3, to continue this.  Continue Fosamax, educated her at length on this.  Repeat DEXA in 2023.  Monitor for fractures or falls.  Vit D level in October.

## 2020-12-08 NOTE — Assessment & Plan Note (Signed)
Chronic, stable with Prilosec.  Denies any symptoms at this time and no use of TUMS. Continue current medication regimen and adjust as needed.  Mag level in October.

## 2020-12-08 NOTE — Assessment & Plan Note (Signed)
Chronic, stable on current medication regimen. Denies SI/HI.  Continue Trazodone which benefits sleep and mood + Buspar as needed.  Adjust regimen as needed based on mood and scores.  Refills up to date.  Return in 6 months.

## 2020-12-08 NOTE — Assessment & Plan Note (Signed)
Chronic, ongoing.  Continue current medication regimen, has benefit from Buspar as needed.  Uses minimally.  Adjust regimen as needed.  Refills up to date.

## 2020-12-08 NOTE — Assessment & Plan Note (Addendum)
Chronic, diet-controlled, stable with BP at goal recent visits and at home.  Continue monitoring BP at home 3 mornings a week and documenting for provider.  Will monitor and initiate medications if consistently elevated.  CMP next visit.  Return in 6 months.

## 2020-12-08 NOTE — Assessment & Plan Note (Signed)
Chronic, ongoing.  Continue current medication regimen and adjust as needed.  Lipid panel in 2 weeks.  Praised for ongoing weight loss.

## 2020-12-24 ENCOUNTER — Ambulatory Visit: Payer: Medicare Other | Admitting: Nurse Practitioner

## 2021-01-07 ENCOUNTER — Other Ambulatory Visit: Payer: Self-pay

## 2021-01-07 ENCOUNTER — Ambulatory Visit (INDEPENDENT_AMBULATORY_CARE_PROVIDER_SITE_OTHER): Payer: Medicare Other | Admitting: Nurse Practitioner

## 2021-01-07 ENCOUNTER — Encounter: Payer: Self-pay | Admitting: Nurse Practitioner

## 2021-01-07 VITALS — BP 124/67 | HR 64 | Temp 98.3°F | Wt 149.0 lb

## 2021-01-07 DIAGNOSIS — M25512 Pain in left shoulder: Secondary | ICD-10-CM | POA: Diagnosis not present

## 2021-01-07 DIAGNOSIS — E7800 Pure hypercholesterolemia, unspecified: Secondary | ICD-10-CM | POA: Diagnosis not present

## 2021-01-07 NOTE — Addendum Note (Signed)
Addended by: Mortimer Fries on: 01/07/2021 09:54 AM   Modules accepted: Orders

## 2021-01-07 NOTE — Assessment & Plan Note (Signed)
History of surgeries to this shoulder, rotator cuff.  Suspect some arthritis present vs ?nerve impingement from recent heavy lifting.  Recommend continue Tizanidine as needed + Tylenol.  She does not wish to attend PT due to cost, although would be beneficial for pain and ROM. At this time recommend PT exercises at home and take Tylenol as needed.  Educated on Tylenol and max daily dose.  May also use Icy/Hot or Voltaren gel as needed.  Continue to use heat for discomfort.  At this time referral to ortho placed due to ongoing discomfort would benefit their input.  Return as scheduled for physical.

## 2021-01-07 NOTE — Patient Instructions (Signed)
I have placed a referral to [facility] for further evaluation of ongoing shoulder pain.   Shoulder Pain Many things can cause shoulder pain, including:  An injury.  Moving the shoulder in the same way again and again (overuse).  Joint pain (arthritis). Pain can come from:  Swelling and irritation (inflammation) of any part of the shoulder.  An injury to the shoulder joint.  An injury to: ? Tissues that connect muscle to bone (tendons). ? Tissues that connect bones to each other (ligaments). ? Bones. Follow these instructions at home: Watch for changes in your symptoms. Let your doctor know about them. Follow these instructions to help with your pain. If you have a sling:  Wear the sling as told by your doctor. Remove it only as told by your doctor.  Loosen the sling if your fingers: ? Tingle. ? Become numb. ? Turn cold and blue.  Keep the sling clean.  If the sling is not waterproof: ? Do not let it get wet. ? Take the sling off when you shower or bathe. Managing pain, stiffness, and swelling  If told, put ice on the painful area: ? Put ice in a plastic bag. ? Place a towel between your skin and the bag. ? Leave the ice on for 20 minutes, 2-3 times a day. Stop putting ice on if it does not help with the pain.  Squeeze a soft ball or a foam pad as much as possible. This prevents swelling in the shoulder. It also helps to strengthen the arm.   General instructions  Take over-the-counter and prescription medicines only as told by your doctor.  Keep all follow-up visits as told by your doctor. This is important. Contact a doctor if:  Your pain gets worse.  Medicine does not help your pain.  You have new pain in your arm, hand, or fingers. Get help right away if:  Your arm, hand, or fingers: ? Tingle. ? Are numb. ? Are swollen. ? Are painful. ? Turn white or blue. Summary  Shoulder pain can be caused by many things. These include injury, moving  the shoulder in the same away again and again, and joint pain.  Watch for changes in your symptoms. Let your doctor know about them.  This condition may be treated with a sling, ice, and pain medicine.  Contact your doctor if the pain gets worse or you have new pain. Get help right away if your arm, hand, or fingers tingle or get numb, swollen, or painful.  Keep all follow-up visits as told by your doctor. This is important. This information is not intended to replace advice given to you by your health care provider. Make sure you discuss any questions you have with your health care provider. Document Revised: 02/19/2018 Document Reviewed: 02/19/2018 Elsevier Patient Education  2021 [employer].

## 2021-01-07 NOTE — Progress Notes (Signed)
BP 124/67 (BP Location: Left Arm, Cuff Size: Normal)   Pulse 64   Temp 98.3 F (36.8 C) (Oral)   Wt 149 lb (67.6 kg)   LMP 02/01/1991 (Approximate)   SpO2 99%   BMI 29.10 kg/m    Subjective:    Patient ID: Emily Watson, female    DOB: Apr 03, 1946, 75 y.o.   MRN: [id number]  HPI: Emily Watson is a 75 y.o. female  Chief Complaint  Patient presents with  . Arm Pain    Patient states she had prior surgery. Patient states she noticed an all of a sudden "stabbing, stinging and tighten pain in her left bicep area." Patient states she may have done something and not quite sure what it was. Patient states the pain will just come out of no where and all of a sudden.    SHOULDER PAIN Follow-up for left shoulder pain, treated with Prednisone and Tizanidine on 12/08/20.  This did not help much.  To deltoid area of left shoulder, can not lay on that side.  Had been doing a lot of lifting around home, as was preparing to move.  Is right hand dominant.  Started hurting 3-4 months ago.  Denies any CP or SOB.  Feels like someone punched her in deltoid.  History of rotator cuff repair in past to this shoulder, plus another surgery.  Is using heat and ice, but not taking any medications right now for pain. Duration: months Involved shoulder: left Mechanism of injury: lots of lifting lately Location: anterior Onset:sudden Severity: 8/10 at worst Quality: started out sharp, now more dull and aching Frequency: intermittent  Radiation: no Aggravating factors: lifting and laying down at night Alleviating factors: heat and ice Status: stable Treatments attempted: heat  Relief with NSAIDs?:  No NSAIDs Taken Weakness: no Numbness: no Decreased grip strength: no Redness: no Swelling: no Bruising: no Fevers: no  Medical, surgical, family and social history reviewed and updated as indicated. Interim medical history since our last visit reviewed. Allergies and medications reviewed and  updated.  Review of Systems  Constitutional: Negative for activity change, appetite change, diaphoresis, fatigue and fever.  Respiratory: Negative for cough, chest tightness and shortness of breath.   Cardiovascular: Negative for chest pain, palpitations and leg swelling.  Gastrointestinal: Negative.   Musculoskeletal: Positive for arthralgias.  Neurological: Negative.   Psychiatric/Behavioral: Negative.     Per HPI unless specifically indicated above     Objective:    BP 124/67 (BP Location: Left Arm, Cuff Size: Normal)   Pulse 64   Temp 98.3 F (36.8 C) (Oral)   Wt 149 lb (67.6 kg)   LMP 02/01/1991 (Approximate)   SpO2 99%   BMI 29.10 kg/m   Wt Readings from Last 3 Encounters:  01/07/21 149 lb (67.6 kg)  07/02/20 155 lb (70.3 kg)  06/09/20 155 lb 12.8 oz (70.7 kg)    Physical Exam Vitals and nursing note reviewed.  Constitutional:      General: She is awake. She is not in acute distress.    Appearance: She is well-developed and well-groomed. She is not ill-appearing or toxic-appearing.  HENT:     Head: Normocephalic.     Right Ear: Hearing normal.     Left Ear: Hearing normal.  Eyes:     General: Lids are normal.        Right eye: No discharge.        Left eye: No discharge.     Conjunctiva/sclera: Conjunctivae normal.  Neck:  Thyroid: No thyromegaly.     Vascular: No carotid bruit.  Cardiovascular:     Rate and Rhythm: Normal rate and regular rhythm.     Heart sounds: Normal heart sounds. No murmur heard. No gallop.   Pulmonary:     Effort: Pulmonary effort is normal. No accessory muscle usage or respiratory distress.     Breath sounds: Normal breath sounds.  Abdominal:     General: Bowel sounds are normal.     Palpations: Abdomen is soft.     Tenderness: There is no abdominal tenderness.  Musculoskeletal:     Right shoulder: Normal.     Left shoulder: Tenderness present. No swelling, laceration, bony tenderness or crepitus. Normal range of motion.  Normal strength.     Cervical back: Normal range of motion.     Right lower leg: No edema.     Left lower leg: No edema.     Comments: Tenderness with flexion and extension not.  Able to lift left shoulder to about 50 degrees before discomfort present.  Mild tenderness to deltoid and anterior shoulder.  No rashes noted.  Neer and Hawkins testing show discomfort.  Neurological:     Mental Status: She is alert and oriented to person, place, and time.     Deep Tendon Reflexes: Reflexes are normal and symmetric.     Reflex Scores:      Brachioradialis reflexes are 2+ on the right side and 2+ on the left side.      Patellar reflexes are 2+ on the right side and 2+ on the left side. Psychiatric:        Attention and Perception: Attention normal.        Mood and Affect: Mood normal.        Speech: Speech normal.        Behavior: Behavior normal. Behavior is cooperative.        Thought Content: Thought content normal.    Results for orders placed or performed in visit on 06/09/20  CBC with Differential/Platelet  Result Value Ref Range   WBC 4.8 3.4 - 10.8 x10E3/uL   RBC 3.81 3.77 - 5.28 x10E6/uL   Hemoglobin 12.9 11.1 - 15.9 g/dL   Hematocrit 16.0 10.9 - 46.6 %   MCV 101 (H) 79 - 97 fL   MCH 33.9 (H) 26.6 - 33.0 pg   MCHC 33.7 31.5 - 35.7 g/dL   RDW 32.3 55.7 - 32.2 %   Platelets 372 150 - 450 x10E3/uL   Neutrophils 55 Not Estab. %   Lymphs 35 Not Estab. %   Monocytes 7 Not Estab. %   Eos 2 Not Estab. %   Basos 1 Not Estab. %   Neutrophils Absolute 2.7 1.4 - 7.0 x10E3/uL   Lymphocytes Absolute 1.7 0.7 - 3.1 x10E3/uL   Monocytes Absolute 0.3 0.1 - 0.9 x10E3/uL   EOS (ABSOLUTE) 0.1 0.0 - 0.4 x10E3/uL   Basophils Absolute 0.0 0.0 - 0.2 x10E3/uL   Immature Granulocytes 0 Not Estab. %   Immature Grans (Abs) 0.0 0.0 - 0.1 x10E3/uL  Comprehensive metabolic panel  Result Value Ref Range   Glucose 85 65 - 99 mg/dL   BUN 10 8 - 27 mg/dL   Creatinine, Ser 0.25 0.57 - 1.00 mg/dL   GFR  calc non Af Amer 90 >59 mL/min/1.73   GFR calc Af Amer 104 >59 mL/min/1.73   BUN/Creatinine Ratio 16 12 - 28   Sodium 140 134 - 144 mmol/L   Potassium 4.2  3.5 - 5.2 mmol/L   Chloride 103 96 - 106 mmol/L   CO2 24 20 - 29 mmol/L   Calcium 9.0 8.7 - 10.3 mg/dL   Total Protein 6.7 6.0 - 8.5 g/dL   Albumin 4.3 3.7 - 4.7 g/dL   Globulin, Total 2.4 1.5 - 4.5 g/dL   Albumin/Globulin Ratio 1.8 1.2 - 2.2   Bilirubin Total 0.9 0.0 - 1.2 mg/dL   Alkaline Phosphatase 51 44 - 121 IU/L   AST 17 0 - 40 IU/L   ALT 11 0 - 32 IU/L  VITAMIN D 25 Hydroxy (Vit-D Deficiency, Fractures)  Result Value Ref Range   Vit D, 25-Hydroxy 36.9 30.0 - 100.0 ng/mL  TSH  Result Value Ref Range   TSH 0.828 0.450 - 4.500 uIU/mL  Lipid Panel w/o Chol/HDL Ratio  Result Value Ref Range   Cholesterol, Total 155 100 - 199 mg/dL   Triglycerides 76 0 - 149 mg/dL   HDL 57 >19 mg/dL   VLDL Cholesterol Cal 15 5 - 40 mg/dL   LDL Chol Calc (NIH) 83 0 - 99 mg/dL  Magnesium  Result Value Ref Range   Magnesium 2.3 1.6 - 2.3 mg/dL      Assessment & Plan:   Problem List Items Addressed This Visit      Other   Left shoulder pain - Primary    History of surgeries to this shoulder, rotator cuff.  Suspect some arthritis present vs ?nerve impingement from recent heavy lifting.  Recommend continue Tizanidine as needed + Tylenol.  She does not wish to attend PT due to cost, although would be beneficial for pain and ROM. At this time recommend PT exercises at home and take Tylenol as needed.  Educated on Tylenol and max daily dose.  May also use Icy/Hot or Voltaren gel as needed.  Continue to use heat for discomfort.  At this time referral to ortho placed due to ongoing discomfort would benefit their input.  Return as scheduled for physical.      Relevant Orders   Ambulatory referral to Orthopedics       Follow up plan: Return in about 22 weeks (around 06/10/2021) for Needs physical 06/10/21.

## 2021-01-08 LAB — COMPREHENSIVE METABOLIC PANEL
ALT: 16 IU/L (ref 0–32)
AST: 22 IU/L (ref 0–40)
Albumin/Globulin Ratio: 1.9 (ref 1.2–2.2)
Albumin: 4.3 g/dL (ref 3.7–4.7)
Alkaline Phosphatase: 44 IU/L (ref 44–121)
BUN/Creatinine Ratio: 23 (ref 12–28)
BUN: 15 mg/dL (ref 8–27)
Bilirubin Total: 0.8 mg/dL (ref 0.0–1.2)
CO2: 26 mmol/L (ref 20–29)
Calcium: 9.1 mg/dL (ref 8.7–10.3)
Chloride: 105 mmol/L (ref 96–106)
Creatinine, Ser: 0.64 mg/dL (ref 0.57–1.00)
Globulin, Total: 2.3 g/dL (ref 1.5–4.5)
Glucose: 84 mg/dL (ref 65–99)
Potassium: 4.4 mmol/L (ref 3.5–5.2)
Sodium: 143 mmol/L (ref 134–144)
Total Protein: 6.6 g/dL (ref 6.0–8.5)
eGFR: 94 mL/min/{1.73_m2} (ref >59)

## 2021-01-08 LAB — LIPID PANEL W/O CHOL/HDL RATIO
Cholesterol, Total: 171 mg/dL (ref 100–199)
HDL: 61 mg/dL (ref >39)
LDL Chol Calc (NIH): 98 mg/dL (ref 0–99)
Triglycerides: 63 mg/dL (ref 0–149)
VLDL Cholesterol Cal: 12 mg/dL (ref 5–40)

## 2021-01-08 NOTE — Progress Notes (Signed)
Contacted via MyChart   Good afternoon Emily Watson, your labs have returned and overall continue to be stable.  No changes needed.  Great job!! Keep being awesome!!  Thank you for allowing me to participate in your care.  I appreciate you. Kindest regards, Shayon Trompeter

## 2021-01-10 ENCOUNTER — Ambulatory Visit
Admission: RE | Admit: 2021-01-10 | Discharge: 2021-01-10 | Disposition: A | Discharge status: Home / Self Care | Payer: Medicare Other | Source: Ambulatory Visit | Attending: Nurse Practitioner | Admitting: Nurse Practitioner

## 2021-01-10 ENCOUNTER — Ambulatory Visit
Admission: RE | Admit: 2021-01-10 | Discharge: 2021-01-10 | Disposition: A | Discharge status: Home / Self Care | Payer: Medicare Other | Source: Home / Self Care | Attending: Nurse Practitioner | Admitting: Nurse Practitioner

## 2021-01-10 ENCOUNTER — Other Ambulatory Visit: Payer: Self-pay

## 2021-01-10 DIAGNOSIS — M25512 Pain in left shoulder: Secondary | ICD-10-CM | POA: Insufficient documentation

## 2021-01-10 DIAGNOSIS — Z96612 Presence of left artificial shoulder joint: Secondary | ICD-10-CM | POA: Diagnosis not present

## 2021-01-10 DIAGNOSIS — Z471 Aftercare following joint replacement surgery: Secondary | ICD-10-CM | POA: Diagnosis not present

## 2021-01-10 NOTE — Addendum Note (Signed)
Addended by: [name] T on: 01/10/2021 01:09 PM   Modules accepted: Orders

## 2021-01-12 ENCOUNTER — Other Ambulatory Visit: Payer: Self-pay | Admitting: Nurse Practitioner

## 2021-01-12 NOTE — Progress Notes (Signed)
Contacted via MyChart   Good evening Elverta, your imaging has returned and no acute findings present.  If ongoing pain please let me know and return to office.  We could consider steroid injection to shoulder if ongoing.   Keep being awesome!!  Thank you for allowing me to participate in your care.  I appreciate you. Kindest regards, Ayan Heffington

## 2021-01-15 ENCOUNTER — Other Ambulatory Visit: Payer: Self-pay | Admitting: Nurse Practitioner

## 2021-01-15 NOTE — Telephone Encounter (Signed)
Change of pharmacy Requested Prescriptions  Pending Prescriptions Disp Refills  . alendronate (FOSAMAX) 70 MG tablet [Pharmacy Med Name: Alendronate Sodium 70 MG Oral Tablet] 4 tablet 0    Sig: TAKE 1 TABLET (70 MG) BY MOUTH EVERY 7 DAYS. TAKE WITH A FULL GLASS OF WATER ON AN EMPTY STOMACH.     Endocrinology:  Bisphosphonates Passed - 01/15/2021  9:05 AM      Passed - Ca in normal range and within 360 days    Calcium  Date Value Ref Range Status  01/07/2021 9.1 8.7 - 10.3 mg/dL Final         Passed - Vitamin D in normal range and within 360 days    Vit D, 25-Hydroxy  Date Value Ref Range Status  06/09/2020 36.9 30.0 - 100.0 ng/mL Final    Comment:    Vitamin D deficiency has been defined by the Institute of Medicine and an Endocrine Society practice guideline as a level of serum 25-OH vitamin D less than 20 ng/mL (1,2). The Endocrine Society went on to further define vitamin D insufficiency as a level between 21 and 29 ng/mL (2). 1. IOM (Institute of Medicine). 2010. Dietary reference    intakes for calcium and D. [place]: The    [employer]. 2. Holick MF, Binkley Florissant, Bischoff-Ferrari HA, et al.    Evaluation, treatment, and prevention of vitamin D    deficiency: an Endocrine Society clinical practice    guideline. JCEM. 2011 Jul; 96(7):1911-30.          Passed - Valid encounter within last 12 months    Recent Outpatient Visits          1 week ago Acute pain of left shoulder   [facility] Lowrey, [place] T, NP   1 month ago Depression, major, single episode, moderate (HCC)   [facility] Bethune, Dorie Rank, NP   7 months ago Routine general medical examination at a health care facility   [facility] Organ, [place] T, NP   10 months ago Hordeolum internum of left lower eyelid   [facility] Valentino Nose, NP   10 months ago Hordeolum internum of left lower eyelid   [facility] [place],  Dorie Rank, NP      Future Appointments            In 5 months Cannady, Dorie Rank, NP [facility]   In 5 months  [facility]

## 2021-01-24 ENCOUNTER — Telehealth: Payer: Self-pay | Admitting: Nurse Practitioner

## 2021-01-24 NOTE — Telephone Encounter (Signed)
Pt was advised to call if still having pain in left shoulder / she is considering the injections/ pt would like to know what her PCP advises asap

## 2021-01-24 NOTE — Telephone Encounter (Signed)
Please advise 

## 2021-01-24 NOTE — Telephone Encounter (Signed)
Called pt she is wanting to know if do you the shoulder injection or do you want her to see the person that done her shoulder years ago

## 2021-01-24 NOTE — Telephone Encounter (Signed)
I do them here in office and we can schedule please

## 2021-01-24 NOTE — Telephone Encounter (Signed)
Called pt to advise of Jolene's message pt will reach out to her daughter in law to see about scheduling

## 2021-01-24 NOTE — Telephone Encounter (Signed)
I recommend trying this if pain still present.

## 2021-02-04 DIAGNOSIS — Z96612 Presence of left artificial shoulder joint: Secondary | ICD-10-CM | POA: Diagnosis not present

## 2021-02-04 DIAGNOSIS — M25512 Pain in left shoulder: Secondary | ICD-10-CM | POA: Diagnosis not present

## 2021-02-07 ENCOUNTER — Other Ambulatory Visit: Payer: Self-pay | Admitting: Nurse Practitioner

## 2021-02-07 DIAGNOSIS — M25512 Pain in left shoulder: Secondary | ICD-10-CM | POA: Diagnosis not present

## 2021-02-15 ENCOUNTER — Telehealth: Payer: Self-pay

## 2021-02-15 NOTE — Telephone Encounter (Signed)
Called and advised patient to reach out to Dr. Odis Luster office for this information as we do not know.

## 2021-02-15 NOTE — Telephone Encounter (Signed)
Copied from CRM [id number]. Topic: General - Other >> Feb 15, 2021  8:54 AM [name] A wrote: Reason for CRM: Patient would like to be contacted by a member of staff regarding imaging of their left shoulder  The patient is uncertain of where Dr. Odis Luster submitted orders for imaging to be done at  Please contact to further advise

## 2021-02-16 ENCOUNTER — Other Ambulatory Visit: Payer: Self-pay | Admitting: Orthopedic Surgery

## 2021-02-22 ENCOUNTER — Other Ambulatory Visit: Payer: Self-pay | Admitting: Orthopedic Surgery

## 2021-02-22 ENCOUNTER — Other Ambulatory Visit (HOSPITAL_COMMUNITY): Payer: Self-pay | Admitting: Orthopedic Surgery

## 2021-02-22 DIAGNOSIS — M25512 Pain in left shoulder: Secondary | ICD-10-CM

## 2021-02-23 ENCOUNTER — Telehealth: Payer: Self-pay | Admitting: Nurse Practitioner

## 2021-02-23 NOTE — Telephone Encounter (Signed)
Please advise 

## 2021-02-23 NOTE — Telephone Encounter (Signed)
Pt's daughter Donnella Sham [facility] Samuella Cota) who is on pt's DPR called regarding her mom's health. She stated she is concerned that her mom is showing beginning signs of Alzheimer's/Dementia. She would like to know if she could consult with pt's PCP Jolene by telephone or virtual appointment without her mother presents. She stated this conversation could cause her mother's anxiety to increase. She is requesting a callback  [phone]. Please advise.

## 2021-02-23 NOTE — Telephone Encounter (Signed)
Called pt's daughter stacey she states that the pt can't know that she has informed Jolene because it will set her off she mentioned coming in for cpe advised cpe not until October she is saying that the pt's anxiety medicine is not working for her wanting to know if jolene can somehow get her to schedule an appt. Please advise. Advised to her we can't make the pt come in.

## 2021-02-24 NOTE — Telephone Encounter (Signed)
Noted  
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
## 2021-02-24 NOTE — Telephone Encounter (Signed)
Called pt scheduled 7/12

## 2021-02-26 ENCOUNTER — Other Ambulatory Visit: Payer: Self-pay

## 2021-02-26 ENCOUNTER — Encounter: Payer: Self-pay | Admitting: Emergency Medicine

## 2021-02-26 ENCOUNTER — Emergency Department
Admission: EM | Admit: 2021-02-26 | Discharge: 2021-02-26 | Disposition: A | Discharge status: Home / Self Care | Payer: Medicare Other | Attending: Emergency Medicine | Admitting: Emergency Medicine

## 2021-02-26 DIAGNOSIS — S80862A Insect bite (nonvenomous), left lower leg, initial encounter: Secondary | ICD-10-CM | POA: Insufficient documentation

## 2021-02-26 DIAGNOSIS — Z79899 Other long term (current) drug therapy: Secondary | ICD-10-CM | POA: Diagnosis not present

## 2021-02-26 DIAGNOSIS — Z7982 Long term (current) use of aspirin: Secondary | ICD-10-CM | POA: Diagnosis not present

## 2021-02-26 DIAGNOSIS — Z87891 Personal history of nicotine dependence: Secondary | ICD-10-CM | POA: Diagnosis not present

## 2021-02-26 DIAGNOSIS — W57XXXA Bitten or stung by nonvenomous insect and other nonvenomous arthropods, initial encounter: Secondary | ICD-10-CM | POA: Insufficient documentation

## 2021-02-26 DIAGNOSIS — Z96652 Presence of left artificial knee joint: Secondary | ICD-10-CM | POA: Insufficient documentation

## 2021-02-26 DIAGNOSIS — I10 Essential (primary) hypertension: Secondary | ICD-10-CM | POA: Diagnosis not present

## 2021-02-26 MED ORDER — CEPHALEXIN 500 MG PO CAPS
500.0000 mg | ORAL_CAPSULE | Freq: Three times a day (TID) | ORAL | 0 refills | Status: DC
Start: 2021-02-26 — End: 2021-04-26

## 2021-02-26 MED ORDER — DEXAMETHASONE SODIUM PHOSPHATE 10 MG/ML IJ SOLN
10.0000 mg | Freq: Once | INTRAMUSCULAR | Status: AC
  Administered 2021-02-26: 10 mg via INTRAMUSCULAR
  Filled 2021-02-26: qty 1

## 2021-02-26 MED ORDER — HYDROXYZINE HCL 10 MG PO TABS: 10.0000 mg | ORAL_TABLET | Freq: Three times a day (TID) | ORAL | 0 refills | Status: DC | PRN

## 2021-02-26 NOTE — ED Triage Notes (Signed)
Pt reports she moved a car yesterday so the man could mow her grass and she was bit by some insects and now has a rash on her left lower leg that itches real bad. Pt reports has been using hydrocortisone cream

## 2021-02-26 NOTE — ED Provider Notes (Signed)
[facility] Emergency Department Provider Note  ____________________________________________   Event Date/Time   First MD Initiated Contact with Patient 02/26/21 1045     (approximate)  I have reviewed the triage vital signs and the nursing notes.   HISTORY  Chief Complaint Insect Bite   HPI Emily Watson is a 75 y.o. female presents to the ED with complaint of insect bites to the left leg.  Patient states that yesterday she moved her car to allow someone to mow underneath of it.  She did not actually see an insect and suspects possibly ant bites.  She states that initially she had no symptoms but later in the evening began itching and continues to do so even with her using hydrocortisone cream.  She denies any difficulty breathing or swallowing.         Past Medical History:  Diagnosis Date   Allergy    Anaphylaxis    Anxiety    Arthritis    Carpal tunnel syndrome    Depression    GERD (gastroesophageal reflux disease)    Hypertension    patient denies   Peripheral edema    Trigger thumb of left hand     Patient Active Problem List   Diagnosis Date Noted   Left shoulder pain 12/09/2019   Osteoporosis 11/04/2019   Obesity 06/10/2019   Bilateral hearing loss 06/10/2019   Insomnia 05/31/2018   Skin lesions 08/18/2016   Depression, major, single episode, moderate (HCC) 08/18/2016   Allergic rhinitis 06/07/2015   Carpal tunnel syndrome 06/07/2015   Hypercholesteremia 06/07/2015   Hypertension 06/07/2015   Generalized anxiety disorder 06/07/2015   GERD (gastroesophageal reflux disease) 12/31/2013    Past Surgical History:  Procedure Laterality Date   CARPAL TUNNEL RELEASE     HERNIA REPAIR  04/2009   ROTATOR CUFF REPAIR Left    TOTAL KNEE ARTHROPLASTY Left 03/27/2017   Procedure: TOTAL KNEE ARTHROPLASTY - LEFT;  Surgeon: Kennedy Bucker, MD;  Location: [facility] ORS;  Service: Orthopedics;  Laterality: Left;    Prior to Admission  medications   Medication Sig Start Date End Date Taking? Authorizing Provider  cephALEXin (KEFLEX) 500 MG capsule Take 1 capsule (500 mg total) by mouth 3 (three) times daily. 02/26/21  Yes Bridget Hartshorn L, PA-C  hydrOXYzine (ATARAX/VISTARIL) 10 MG tablet Take 1 tablet (10 mg total) by mouth every 8 (eight) hours as needed for itching. 02/26/21  Yes Bridget Hartshorn L, PA-C  alendronate (FOSAMAX) 70 MG tablet TAKE 1 TABLET BY MOUTH EVERY 7 DAYS. TAKE WITH A FULL GLASS OF WATER ON AN EMPTY STOMACH 02/07/21   Cannady, Corrie Dandy T, NP  aspirin 81 MG EC tablet Take 81 mg by mouth daily. Swallow whole.    [provider]  atorvastatin (LIPITOR) 10 MG tablet Take 1 tablet (10 mg total) by mouth daily at 6 PM. 06/09/20   Cannady, Jolene T, NP  busPIRone (BUSPAR) 5 MG tablet Take 1 tablet (5 mg total) by mouth 2 (two) times daily as needed. for anxiety 06/09/20   [name] T, NP  Cholecalciferol (VITAMIN D3 PO) Take by mouth daily. 2000 IU ( )    [provider]  omeprazole (PRILOSEC OTC) 20 MG tablet Take 1 tablet (20 mg total) by mouth daily. 12/25/15   Gabriel Cirri, NP  tiZANidine (ZANAFLEX) 4 MG tablet Take 1 tablet (4 mg total) by mouth every 6 (six) hours as needed for muscle spasms. Patient not taking: Reported on 01/07/2021 12/08/20   Harvest Dark,  Jolene T, NP  traZODone (DESYREL) 50 MG tablet Take 1 tablet (50 mg total) by mouth at bedtime. 06/09/20   Marjie Skiff, NP    Allergies Codeine, Chlorpheniramine-phenylephrine, Gabapentin, Other, Peppermint oil, and Triprolidine-pseudoephedrine  Family History  Problem Relation Age of Onset   Kidney disease Mother    Cancer Mother    Kidney disease Father    Cancer Father    Cancer Sister        bone   Hypertension Sister    Osteoporosis Sister    Breast cancer Sister    Other Son 14       brain tumor   Alzheimer's disease Sister    Hypertension Brother     Social History Social History   Tobacco Use   Smoking  status: Former    Pack years: 0.00    Types: Cigarettes    Quit date: 03/14/1997    Years since quitting: 23.9   Smokeless tobacco: Never  Vaping Use   Vaping Use: Never used  Substance Use Topics   Alcohol use: No    Alcohol/week: 0.0 standard drinks   Drug use: No    Review of Systems Constitutional: No fever/chills Eyes: No visual changes. ENT: Negative for difficulty swallowing. Cardiovascular: Denies chest pain. Respiratory: Denies shortness of breath. Gastrointestinal: No abdominal pain.  No nausea, no vomiting.  Genitourinary: Negative for dysuria. Musculoskeletal: Negative for muscle aches. Skin: Positive for itching and insect bite. Neurological: Negative for headaches, focal weakness or numbness. ____________________________________________   PHYSICAL EXAM:  VITAL SIGNS: ED Triage Vitals  Enc Vitals Group     BP 02/26/21 1033 (!) 128/57     Pulse Rate 02/26/21 1033 72     Resp 02/26/21 1033 20     Temp 02/26/21 1033 98.6 F (37 C)     Temp Source 02/26/21 1033 Oral     SpO2 02/26/21 1033 99 %     Weight 02/26/21 1028 150 lb (68 kg)     Height 02/26/21 1028 5' (1.524 m)     Head Circumference --      Peak Flow --      Pain Score 02/26/21 1028 0     Pain Loc --      Pain Edu? --      Excl. in GC? --     Constitutional: Alert and oriented. Well appearing and in no acute distress. Eyes: Conjunctivae are normal.  Head: Atraumatic. Neck: No stridor.   Cardiovascular: Normal rate, regular rhythm. Grossly normal heart sounds.  Good peripheral circulation. Respiratory: Normal respiratory effort.  No retractions. Lungs CTAB. Musculoskeletal: No gross deformity and patient is able to ambulate without assistance.  Moves upper and lower extremities. Neurologic:  Normal speech and language. No gross focal neurologic deficits are appreciated. No gait instability. Skin: On examination there are multiple pustules with isolated small areas of erythema surrounding each  that involves the medial and lateral aspect of the lower extremity on the left.  No active drainage noted.  Areas are tender to palpation and slightly warm.  No soft tissue edema present.  Areas appear to be individual insect bites.  None are seen above the knee. Psychiatric: Mood and affect are normal. Speech and behavior are normal.  ____________________________________________   LABS (all labs ordered are listed, but only abnormal results are displayed)  Labs Reviewed - No data to display ____________________________________________ __________________________________________   PROCEDURES  Procedure(s) performed (including Critical Care):  Procedures   ____________________________________________   INITIAL  IMPRESSION / ASSESSMENT AND PLAN / ED COURSE  As part of my medical decision making, I reviewed the following data within the electronic MEDICAL RECORD NUMBER Notes from prior ED visits  75 year old female presents to the ED with possible insect bites to her left lower extremity.  Patient states that she was outside and moved her car long enough for someone to mow.  She was not aware that she was being bit by insects but possibility of ants being there is suspicious.  Patient states that late last evening she began itching and has been using cortisone cream.  On exam there are individual pustules with some erythema on the left lower extremity only.  These are suspicious for insect bites that are now infected.  Patient was made aware that she could continue using the hydrocortisone cream.  Keflex 500 mg 3 times daily and Atarax 10 mg every 8 hours as needed for itching was sent to her pharmacy.  Patient is aware that the Atarax could cause drowsiness and increase her risk for injury.  Patient was given Decadron prior to discharge to help control some of the itching.  She will follow-up with her PCP if any continued problems.  ____________________________________________   FINAL CLINICAL  IMPRESSION(S) / ED DIAGNOSES  Final diagnoses:  Bug bite with infection, initial encounter     ED Discharge Orders          Ordered    cephALEXin (KEFLEX) 500 MG capsule  3 times daily        02/26/21 1153    hydrOXYzine (ATARAX/VISTARIL) 10 MG tablet  Every 8 hours PRN        02/26/21 1153             Note:  This document was prepared using Dragon voice recognition software and may include unintentional dictation errors.    Tommi Rumps, PA-C 02/26/21 1250    Merwyn Katos, MD 02/27/21 [phone]

## 2021-02-26 NOTE — ED Notes (Signed)
See triage note. Daughter at bedside.

## 2021-02-26 NOTE — Discharge Instructions (Addendum)
Continue to keep area clean and dry.  Watch for any worsening of the infection.  You may continue using the hydrocortisone cream to help with itching.  A prescription for cephalexin 500 mg 3 times daily is for infection.  The hydroxyzine is every 8 hours as needed for itching.  Be aware that this medication could cause drowsiness and increase your risk for falling.  Follow-up with your primary care provider if any continued problems.

## 2021-03-01 ENCOUNTER — Encounter: Payer: Self-pay | Admitting: Nurse Practitioner

## 2021-03-01 ENCOUNTER — Ambulatory Visit (INDEPENDENT_AMBULATORY_CARE_PROVIDER_SITE_OTHER): Payer: Medicare Other | Admitting: Nurse Practitioner

## 2021-03-01 ENCOUNTER — Other Ambulatory Visit: Payer: Self-pay

## 2021-03-01 VITALS — BP 112/69 | HR 69 | Temp 99.0°F | Wt 150.8 lb

## 2021-03-01 DIAGNOSIS — W57XXXA Bitten or stung by nonvenomous insect and other nonvenomous arthropods, initial encounter: Secondary | ICD-10-CM | POA: Diagnosis not present

## 2021-03-01 DIAGNOSIS — F0393 Unspecified dementia, unspecified severity, with mood disturbance: Secondary | ICD-10-CM | POA: Insufficient documentation

## 2021-03-01 DIAGNOSIS — F01A3 Vascular dementia, mild, with mood disturbance: Secondary | ICD-10-CM | POA: Insufficient documentation

## 2021-03-01 DIAGNOSIS — E7800 Pure hypercholesterolemia, unspecified: Secondary | ICD-10-CM | POA: Diagnosis not present

## 2021-03-01 DIAGNOSIS — F5101 Primary insomnia: Secondary | ICD-10-CM

## 2021-03-01 DIAGNOSIS — R413 Other amnesia: Secondary | ICD-10-CM

## 2021-03-01 DIAGNOSIS — M25512 Pain in left shoulder: Secondary | ICD-10-CM

## 2021-03-01 DIAGNOSIS — F321 Major depressive disorder, single episode, moderate: Secondary | ICD-10-CM

## 2021-03-01 DIAGNOSIS — I10 Essential (primary) hypertension: Secondary | ICD-10-CM

## 2021-03-01 DIAGNOSIS — G8929 Other chronic pain: Secondary | ICD-10-CM

## 2021-03-01 DIAGNOSIS — F411 Generalized anxiety disorder: Secondary | ICD-10-CM

## 2021-03-01 LAB — URINALYSIS, ROUTINE W REFLEX MICROSCOPIC
Bilirubin, UA: NEGATIVE
Glucose, UA: NEGATIVE
Ketones, UA: NEGATIVE
Nitrite, UA: NEGATIVE
Protein,UA: NEGATIVE
RBC, UA: NEGATIVE
Specific Gravity, UA: 1.025 (ref 1.005–1.030)
Urobilinogen, Ur: 1 mg/dL (ref 0.2–1.0)
pH, UA: 6 (ref 5.0–7.5)

## 2021-03-01 LAB — MICROSCOPIC EXAMINATION
Bacteria, UA: NONE SEEN
Epithelial Cells (non renal): NONE SEEN /hpf (ref 0–10)
RBC, Urine: NONE SEEN /hpf (ref 0–2)

## 2021-03-01 MED ORDER — DONEPEZIL HCL 5 MG PO TABS: 5.0000 mg | ORAL_TABLET | Freq: Every day | ORAL | 4 refills | Status: DC

## 2021-03-01 NOTE — Assessment & Plan Note (Addendum)
Chronic, stable on current medication regimen. Denies SI/HI.  Continue Trazodone which benefits sleep and mood + Buspar as needed.  Adjust regimen as needed based on mood and scores.  Refills up to date.

## 2021-03-01 NOTE — Assessment & Plan Note (Signed)
Reported by her daughter, patient denies major concerns with this, only notes occasional moments of forgetfulness.  MMSE today 27/30, was unable to recall all three words presented and struggled with location briefly -- although part of this may have been issue with hearing provider -- had to repeat question a few times.  ?memory issues related to her increased personal stressors at present.  Will obtain labs today to include CBC, CMP, B12, Lipid, RPR, TSH, urinalysis.  She declines imaging or neurology at this time, until after her shoulder is improved -- has upcoming imaging for this.  Is agreeable to starting memory care medication -- will trial Aricept 5 MG daily and adjust as needed, script sent.  Return in 6 weeks for follow-up.

## 2021-03-01 NOTE — Patient Instructions (Signed)
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
## 2021-03-01 NOTE — Assessment & Plan Note (Signed)
Chronic, diet-controlled, stable with BP at goal recent visits and at home.  Continue monitoring BP at home 3 mornings a week and documenting for provider.  Will monitor and initiate medications if consistently elevated.  CMP today.

## 2021-03-01 NOTE — Assessment & Plan Note (Signed)
Chronic, ongoing.  Continue current medication regimen and adjust as needed. Lipid panel today. 

## 2021-03-01 NOTE — Assessment & Plan Note (Signed)
Chronic, ongoing.  Continue current medication regimen, has benefit from Buspar as needed.  Using a bit more at this time, more on twice daily basis, due to increased stressors with daughter living with her.  Adjust regimen as needed.  Refills up to date.

## 2021-03-01 NOTE — Assessment & Plan Note (Signed)
To left upper, medial calf area.  Healing well, decreased erythema reported and crusting present over bite.  Continue abx regimen until complete and monitor closely.  If any increased redness, edema, or warmth notify provider immediately.

## 2021-03-01 NOTE — Assessment & Plan Note (Signed)
Chronic, ongoing, being followed by ortho at this time and scheduled for upcoming CT.  Continue this collaboration and review notes when available.

## 2021-03-01 NOTE — Assessment & Plan Note (Signed)
Chronic, improved with Trazodone.  Continue current medication which benefits mood and sleep.  Adjust as needed.  Refills up to date.

## 2021-03-02 LAB — COMPREHENSIVE METABOLIC PANEL
ALT: 15 IU/L (ref 0–32)
AST: 16 IU/L (ref 0–40)
Albumin/Globulin Ratio: 2 (ref 1.2–2.2)
Albumin: 4.3 g/dL (ref 3.7–4.7)
Alkaline Phosphatase: 48 IU/L (ref 44–121)
BUN/Creatinine Ratio: 21 (ref 12–28)
BUN: 14 mg/dL (ref 8–27)
Bilirubin Total: 0.6 mg/dL (ref 0.0–1.2)
CO2: 26 mmol/L (ref 20–29)
Calcium: 9.5 mg/dL (ref 8.7–10.3)
Chloride: 101 mmol/L (ref 96–106)
Creatinine, Ser: 0.68 mg/dL (ref 0.57–1.00)
Globulin, Total: 2.2 g/dL (ref 1.5–4.5)
Glucose: 97 mg/dL (ref 65–99)
Potassium: 3.7 mmol/L (ref 3.5–5.2)
Sodium: 140 mmol/L (ref 134–144)
Total Protein: 6.5 g/dL (ref 6.0–8.5)
eGFR: 92 mL/min/{1.73_m2} (ref >59)

## 2021-03-02 LAB — LIPID PANEL W/O CHOL/HDL RATIO
Cholesterol, Total: 171 mg/dL (ref 100–199)
HDL: 64 mg/dL (ref >39)
LDL Chol Calc (NIH): 92 mg/dL (ref 0–99)
Triglycerides: 79 mg/dL (ref 0–149)
VLDL Cholesterol Cal: 15 mg/dL (ref 5–40)

## 2021-03-02 LAB — CBC WITH DIFFERENTIAL/PLATELET
Basophils Absolute: 0.1 10*3/uL (ref 0.0–0.2)
Basos: 1 %
EOS (ABSOLUTE): 0.3 10*3/uL (ref 0.0–0.4)
Eos: 6 %
Hematocrit: 37.8 % (ref 34.0–46.6)
Hemoglobin: 12.9 g/dL (ref 11.1–15.9)
Immature Grans (Abs): 0 10*3/uL (ref 0.0–0.1)
Immature Granulocytes: 0 %
Lymphocytes Absolute: 2.2 10*3/uL (ref 0.7–3.1)
Lymphs: 41 %
MCH: 33.6 pg — ABNORMAL HIGH (ref 26.6–33.0)
MCHC: 34.1 g/dL (ref 31.5–35.7)
MCV: 98 fL — ABNORMAL HIGH (ref 79–97)
Monocytes Absolute: 0.4 10*3/uL (ref 0.1–0.9)
Monocytes: 8 %
Neutrophils Absolute: 2.4 10*3/uL (ref 1.4–7.0)
Neutrophils: 44 %
Platelets: 373 10*3/uL (ref 150–450)
RBC: 3.84 x10E6/uL (ref 3.77–5.28)
RDW: 13.5 % (ref 11.7–15.4)
WBC: 5.4 10*3/uL (ref 3.4–10.8)

## 2021-03-02 LAB — TSH: TSH: 1.63 u[IU]/mL (ref 0.450–4.500)

## 2021-03-02 LAB — RPR: RPR Ser Ql: NONREACTIVE

## 2021-03-02 LAB — VITAMIN B12: Vitamin B-12: 342 pg/mL (ref 232–1245)

## 2021-03-02 NOTE — Progress Notes (Signed)
Contacted via MyChart -- please call to make sure she has received message: Good evening Shannan, your labs have returned and are overall normal and at goal, with exception of Vitamin B12 which is on lower end of normal.  I would recommend she start taking Vitamin B12 500 to 1000 MCG daily, which she can obtain in any vitamin section at any store.  This is good for nervous system and brain health.  Any questions? Keep being awesome!!  Thank you for allowing me to participate in your care.  I appreciate you. Kindest regards, Raeshawn Vo

## 2021-03-03 ENCOUNTER — Telehealth: Payer: Self-pay

## 2021-03-03 NOTE — Telephone Encounter (Signed)
Patient would like to know if she can take alendronate (FOSAMAX) 70 MG tablet because of her CT on Friday, please advise caller at [phone]

## 2021-03-03 NOTE — Telephone Encounter (Signed)
Patient aware of provider advise. No further questions  

## 2021-03-03 NOTE — Telephone Encounter (Signed)
Called patient left VM with provider advise. Informed patient to call back if any further questions or concerns.

## 2021-03-03 NOTE — Telephone Encounter (Signed)
Patient has question regarding her medication. Patient is having CT of shoulder tomorrow and wants to know if its okay to take her Fosamax tomorrow because she normally takes it on Fridays. Wants to know if there is any other medications she should not take before the CT

## 2021-03-04 ENCOUNTER — Ambulatory Visit: Payer: Medicare Other

## 2021-03-04 ENCOUNTER — Other Ambulatory Visit: Payer: Self-pay

## 2021-03-04 ENCOUNTER — Ambulatory Visit
Admission: RE | Admit: 2021-03-04 | Discharge: 2021-03-04 | Disposition: A | Discharge status: Home / Self Care | Payer: Medicare Other | Source: Ambulatory Visit | Attending: Orthopedic Surgery | Admitting: Orthopedic Surgery

## 2021-03-04 DIAGNOSIS — M25512 Pain in left shoulder: Secondary | ICD-10-CM

## 2021-03-04 DIAGNOSIS — Z96612 Presence of left artificial shoulder joint: Secondary | ICD-10-CM | POA: Diagnosis not present

## 2021-03-04 MED ORDER — LIDOCAINE HCL (PF) 1 % IJ SOLN
5.0000 mL | Freq: Once | INTRAMUSCULAR | Status: AC
  Administered 2021-03-04: 5 mL
  Filled 2021-03-04: qty 5

## 2021-03-04 MED ORDER — IOHEXOL 180 MG/ML  SOLN
10.0000 mL | Freq: Once | INTRAMUSCULAR | Status: AC | PRN
  Administered 2021-03-04: 10 mL

## 2021-03-04 MED ORDER — SODIUM CHLORIDE (PF) 0.9 % IJ SOLN
10.0000 mL | INTRAMUSCULAR | Status: DC | PRN
  Administered 2021-03-04: 5 mL

## 2021-03-16 ENCOUNTER — Other Ambulatory Visit: Payer: Self-pay | Admitting: Nurse Practitioner

## 2021-03-16 NOTE — Telephone Encounter (Signed)
Requested Prescriptions  Pending Prescriptions Disp Refills  . alendronate (FOSAMAX) 70 MG tablet [Pharmacy Med Name: Alendronate Sodium 70 MG Oral Tablet] 4 tablet 0    Sig: TAKE 1 TABLET BY MOUTH ONCE A WEEK. TAKE WITH A FULL GLASS OF WATER ON AN EMPTY STOMACH     Endocrinology:  Bisphosphonates Passed - 03/16/2021  5:32 PM      Passed - Ca in normal range and within 360 days    Calcium  Date Value Ref Range Status  03/01/2021 9.5 8.7 - 10.3 mg/dL Final         Passed - Vitamin D in normal range and within 360 days    Vit D, 25-Hydroxy  Date Value Ref Range Status  06/09/2020 36.9 30.0 - 100.0 ng/mL Final    Comment:    Vitamin D deficiency has been defined by the Institute of Medicine and an Endocrine Society practice guideline as a level of serum 25-OH vitamin D less than 20 ng/mL (1,2). The Endocrine Society went on to further define vitamin D insufficiency as a level between 21 and 29 ng/mL (2). 1. IOM (Institute of Medicine). 2010. Dietary reference    intakes for calcium and D. [place]: The    [employer]. 2. Holick MF, Binkley [place], Bischoff-Ferrari HA, et al.    Evaluation, treatment, and prevention of vitamin D    deficiency: an Endocrine Society clinical practice    guideline. JCEM. 2011 Jul; 96(7):1911-30.          Passed - Valid encounter within last 12 months    Recent Outpatient Visits          2 weeks ago Depression, major, single episode, moderate (HCC)   [facility] [place], Naselle T, NP   2 months ago Acute pain of left shoulder   [facility] [place], Dorado T, NP   3 months ago Depression, major, single episode, moderate (HCC)   [facility] Cannady, Dorie Rank, NP   9 months ago Routine general medical examination at a health care facility   [facility] [place], Corrie Dandy T, NP   1 year ago Hordeolum internum of left lower eyelid   [facility] Valentino Nose, NP       Future Appointments            In 4 weeks Cannady, Dorie Rank, NP [facility]   In 3 months [place], Dorie Rank, NP [facility]   In 3 months  [facility]

## 2021-03-21 ENCOUNTER — Ambulatory Visit: Payer: Self-pay | Admitting: *Deleted

## 2021-03-21 NOTE — Telephone Encounter (Signed)
I returned her call.   She has been prescribed Aricept on 03/01/2021 by [name], NP.   She has not started taking it.  After reading the side effects she is concerned about the safety of it.  I let her know I would pass her concern to [facility] and see if there was an alternative.   Someone would give her a call back.   Pt was agreeable to this plan.  See triage notes  I sent my notes to [facility] for [name], NP

## 2021-03-21 NOTE — Telephone Encounter (Signed)
Routing to provider to advise on patient's medication concerns. Please see message from [facility] nurse as patient reported that she does not feel safe taking medication due to side effects. Would like to know if there is another alternative that she can take.

## 2021-03-21 NOTE — Telephone Encounter (Signed)
Reason for Disposition  [1] Caller has URGENT medicine question about med that PCP or specialist prescribed AND [2] triager unable to answer question    Concerned about the side effects of the Aricept.  Not taking it yet.  Answer Assessment - Initial Assessment Questions 1. NAME of MEDICATION: "What medicine are you calling about?"     Donepezil Aricep I've not taken it yet.  2. QUESTION: "What is your question?" (e.g., double dose of medicine, side effect)     I'm concerned about the side effects  The heart beating slowly, skin peeling, insomnia.   Bloody stool, vomiting up blood.   My daughter aggravates me.   She interrupts and then I forget what I'm trying to say so maybe they think I have dementia.     My daughter went behind my back.   I wish she would talk to me if she has a problem with me.  "I'm not really talking to her right now because she gets me upset". 3. PRESCRIBING HCP: "Who prescribed it?" Reason: if prescribed by specialist, call should be referred to that group.     [name], NP 4. SYMPTOMS: "Do you have any symptoms?"     No   Haven't started taking it yet.   Scared of the side effects 5. SEVERITY: If symptoms are present, ask "Are they mild, moderate or severe?"     N/A since she is not taking it. 6. PREGNANCY:  "Is there any chance that you are pregnant?" "When was your last menstrual period?"     N/A due to age  Protocols used: Medication Question Call-A-AH

## 2021-03-21 NOTE — Telephone Encounter (Signed)
OK to remain off of it until [place] returns and can discuss with her

## 2021-03-21 NOTE — Telephone Encounter (Signed)
Spoke with patient and notified her that per Dr.Johnson says that patient is OK for remain off her medication until Jolene returns to office from vacation. Patient was informed that she could wait until her scheduled appointment with Jolene scheduled for 04/13/21 unless she hears back from our office to be seen sooner. Patient verbalized understanding and has no further questions at this time.

## 2021-04-12 ENCOUNTER — Other Ambulatory Visit: Payer: Self-pay | Admitting: Nurse Practitioner

## 2021-04-12 NOTE — Telephone Encounter (Signed)
Patient last seen 03/16/21 and has appointment in September.

## 2021-04-12 NOTE — Telephone Encounter (Signed)
  Notes to clinic:  Verify for continued use  Short supply given    Requested Prescriptions  Pending Prescriptions Disp Refills   alendronate (FOSAMAX) 70 MG tablet [Pharmacy Med Name: Alendronate Sodium 70 MG Oral Tablet] 4 tablet 0    Sig: TAKE 1 TABLET BY MOUTH ONCE A WEEK WITH  A  FULL  GLASS  OF  WATER  ON  AN  EMPTY  STOMACH     Endocrinology:  Bisphosphonates Passed - 04/12/2021 12:03 PM      Passed - Ca in normal range and within 360 days    Calcium  Date Value Ref Range Status  03/01/2021 9.5 8.7 - 10.3 mg/dL Final          Passed - Vitamin D in normal range and within 360 days    Vit D, 25-Hydroxy  Date Value Ref Range Status  06/09/2020 36.9 30.0 - 100.0 ng/mL Final    Comment:    Vitamin D deficiency has been defined by the Institute of Medicine and an Endocrine Society practice guideline as a level of serum 25-OH vitamin D less than 20 ng/mL (1,2). The Endocrine Society went on to further define vitamin D insufficiency as a level between 21 and 29 ng/mL (2). 1. IOM (Institute of Medicine). 2010. Dietary reference    intakes for calcium and D. [place]: The    [employer]. 2. Holick MF, Binkley [place], Bischoff-Ferrari HA, et al.    Evaluation, treatment, and prevention of vitamin D    deficiency: an Endocrine Society clinical practice    guideline. JCEM. 2011 Jul; 96(7):1911-30.           Passed - Valid encounter within last 12 months    Recent Outpatient Visits           1 month ago Depression, major, single episode, moderate (HCC)   [facility] [place], [place] T, NP   3 months ago Acute pain of left shoulder   [facility] [place], Florence T, NP   4 months ago Depression, major, single episode, moderate (HCC)   [facility] [place], Dorie Rank, NP   10 months ago Routine general medical examination at a health care facility   [facility] [place], Corrie Dandy T, NP   1 year ago Hordeolum internum  of left lower eyelid   [facility] Valentino Nose, NP       Future Appointments             In 2 weeks Cannady, Dorie Rank, NP [facility]   In 2 months [place], Dorie Rank, NP [facility]   In 2 months  [facility]

## 2021-04-13 ENCOUNTER — Ambulatory Visit: Payer: Medicare Other | Admitting: Nurse Practitioner

## 2021-04-26 ENCOUNTER — Ambulatory Visit (INDEPENDENT_AMBULATORY_CARE_PROVIDER_SITE_OTHER): Payer: Medicare Other | Admitting: Nurse Practitioner

## 2021-04-26 ENCOUNTER — Other Ambulatory Visit: Payer: Self-pay

## 2021-04-26 ENCOUNTER — Encounter: Payer: Self-pay | Admitting: Nurse Practitioner

## 2021-04-26 VITALS — BP 116/66 | HR 64 | Temp 98.3°F | Wt 154.2 lb

## 2021-04-26 DIAGNOSIS — F411 Generalized anxiety disorder: Secondary | ICD-10-CM

## 2021-04-26 DIAGNOSIS — F321 Major depressive disorder, single episode, moderate: Secondary | ICD-10-CM | POA: Diagnosis not present

## 2021-04-26 DIAGNOSIS — G8929 Other chronic pain: Secondary | ICD-10-CM

## 2021-04-26 DIAGNOSIS — R413 Other amnesia: Secondary | ICD-10-CM

## 2021-04-26 DIAGNOSIS — M25512 Pain in left shoulder: Secondary | ICD-10-CM

## 2021-04-26 NOTE — Progress Notes (Signed)
BP 116/66   Pulse 64   Temp 98.3 F (36.8 C) (Oral)   Wt 154 lb 3.2 oz (69.9 kg)   LMP 02/01/1991 (Approximate)   SpO2 97%   BMI 30.12 kg/m    Subjective:    Patient ID: Emily Watson, female    DOB: 1945-10-27, 75 y.o.   MRN: [id number]  HPI: Emily Watson is a 75 y.o. female  Chief Complaint  Patient presents with   Memory Loss    Patient states when she get irritated or frustrated, that is when she has issues with her memory and states when she is calm she is fine.    Arm Pain    Patient states she has injection in her L arm/shoulder. Patient states she was told she has a rotator cuff tear. Patient states she advised to see another specialist, but specialist moved out of state. Patient is requested a new referral for an Orthopedic doctor.    SHOULDER PAIN (LEFT) Ongoing shoulder pain and seeing ortho, had recent CT shoulder - overall articular fraying of surface noted without tear -- they recommended she see previous shoulder surgeon, but they are no longer in community, she requests referral to a shoulder specialist.  History of rotator cuff repair in past to this shoulder.  No recent shoulder injections, has had two total.  Has not done physical therapy. Duration: months Involved shoulder: left Mechanism of injury: unknown Location: anterior Onset: sudden Severity: 5-6/10 at worst Quality: dull and aching Frequency: intermittent  Radiation: no Aggravating factors: lifting and laying down at night Alleviating factors: heat Status: stable Treatments attempted: heat  Relief with NSAIDs?:  No NSAIDs Taken Weakness: no Numbness: no Decreased grip strength: no Redness: no Swelling: no Bruising: no Fevers: no  DEPRESSION/ANXIETY Continues on Trazodone 50 MG at night + Buspar 5 MG as needed -- taking twice a day.  She follows up for memory changes today -- her last testing was 27/30, patient endorses family issues with her daughter, who no longer lives with her.   Does reports occasional memory changes when she was frustrated or irritated, but no other time.  Was started on Aricept last visit, but never took.  Mood status: stable Satisfied with current treatment?: yes Symptom severity: mild  Duration of current treatment : chronic Side effects: no Medication compliance: good compliance Psychotherapy/counseling: none Depressed mood: yes Anxious mood: yes Anhedonia: no Significant weight loss or gain: no Insomnia: none improved with Trazodone Fatigue: no Feelings of worthlessness or guilt: no Impaired concentration/indecisiveness: no Suicidal ideations: no Hopelessness: no Crying spells: no Depression screen [facility] 2/9 04/26/2021 03/01/2021 12/08/2020 07/02/2020 06/09/2020  Decreased Interest 0 - 0 0 0  Down, Depressed, Hopeless 0 0 0 0 0  PHQ - 2 Score 0 0 0 0 0  Altered sleeping 0 0 0 0 0  Tired, decreased energy 0 0 1 0 0  Change in appetite 0 0 0 0 0  Feeling bad or failure about yourself  0 0 0 0 0  Trouble concentrating 0 0 0 0 0  Moving slowly or fidgety/restless 0 0 1 0 0  Suicidal thoughts 0 0 0 0 0  PHQ-9 Score 0 0 2 0 0  Difficult doing work/chores Not difficult at all Not difficult at all - Not difficult at all Not difficult at all  Some recent data might be hidden    MMSE - Mini Mental State Exam 03/01/2021  Orientation to time 5  Orientation to Place 4  Registration  3  Attention/ Calculation 5  Recall 1  Language- name 2 objects 2  Language- repeat 1  Language- follow 3 step command 3  Language- read & follow direction 1  Write a sentence 1  Copy design 1  Total score 27    Relevant past medical, surgical, family and social history reviewed and updated as indicated. Interim medical history since our last visit reviewed. Allergies and medications reviewed and updated.  Review of Systems  Constitutional:  Negative for activity change, appetite change, diaphoresis, fatigue and fever.  Respiratory:  Negative for cough,  chest tightness and shortness of breath.   Cardiovascular:  Negative for chest pain, palpitations and leg swelling.  Gastrointestinal: Negative.   Musculoskeletal:  Positive for arthralgias.  Neurological: Negative.   Psychiatric/Behavioral:  Negative for decreased concentration, self-injury, sleep disturbance and suicidal ideas. The patient is nervous/anxious.    Per HPI unless specifically indicated above     Objective:    BP 116/66   Pulse 64   Temp 98.3 F (36.8 C) (Oral)   Wt 154 lb 3.2 oz (69.9 kg)   LMP 02/01/1991 (Approximate)   SpO2 97%   BMI 30.12 kg/m   Wt Readings from Last 3 Encounters:  04/26/21 154 lb 3.2 oz (69.9 kg)  03/01/21 150 lb 12.8 oz (68.4 kg)  02/26/21 150 lb (68 kg)    Physical Exam Vitals and nursing note reviewed.  Constitutional:      General: She is awake. She is not in acute distress.    Appearance: She is well-developed and well-groomed. She is not ill-appearing or toxic-appearing.  HENT:     Head: Normocephalic.     Right Ear: Hearing normal.     Left Ear: Hearing normal.  Eyes:     General: Lids are normal.        Right eye: No discharge.        Left eye: No discharge.     Conjunctiva/sclera: Conjunctivae normal.  Neck:     Thyroid: No thyromegaly.     Vascular: No carotid bruit.  Cardiovascular:     Rate and Rhythm: Normal rate and regular rhythm.     Heart sounds: Normal heart sounds. No murmur heard.   No gallop.  Pulmonary:     Effort: Pulmonary effort is normal. No accessory muscle usage or respiratory distress.     Breath sounds: Normal breath sounds.  Abdominal:     General: Bowel sounds are normal.     Palpations: Abdomen is soft.     Tenderness: There is no abdominal tenderness.  Musculoskeletal:     Right shoulder: Normal.     Left shoulder: Tenderness present. No swelling, laceration, bony tenderness or crepitus. Normal range of motion. Normal strength.     Cervical back: Normal range of motion.     Right lower  leg: No edema.     Left lower leg: No edema.  Neurological:     Mental Status: She is alert and oriented to person, place, and time.     Cranial Nerves: Cranial nerves are intact.     Motor: Motor function is intact.     Coordination: Coordination is intact.     Gait: Gait is intact.     Deep Tendon Reflexes: Reflexes are normal and symmetric.     Reflex Scores:      Brachioradialis reflexes are 2+ on the right side and 2+ on the left side.      Patellar reflexes are 2+ on the  right side and 2+ on the left side.    Comments: MMSE 27/30 last visit.  Able to fully follow directions without difficulty on exam today.  Psychiatric:        Attention and Perception: Attention normal.        Mood and Affect: Mood normal.        Speech: Speech normal.        Behavior: Behavior normal. Behavior is cooperative.        Thought Content: Thought content normal.   Results for orders placed or performed in visit on 03/01/21  Microscopic Examination   Urine  Result Value Ref Range   WBC, UA 0-5 0 - 5 /hpf   RBC None seen 0 - 2 /hpf   Epithelial Cells (non renal) None seen 0 - 10 /hpf   Bacteria, UA None seen None seen/Few  Urinalysis, Routine w reflex microscopic  Result Value Ref Range   Specific Gravity, UA 1.025 1.005 - 1.030   pH, UA 6.0 5.0 - 7.5   Color, UA Yellow Yellow   Appearance Ur Clear Clear   Leukocytes,UA Trace (A) Negative   Protein,UA Negative Negative/Trace   Glucose, UA Negative Negative   Ketones, UA Negative Negative   RBC, UA Negative Negative   Bilirubin, UA Negative Negative   Urobilinogen, Ur 1.0 0.2 - 1.0 mg/dL   Nitrite, UA Negative Negative   Microscopic Examination See below:   CBC with Differential/Platelet  Result Value Ref Range   WBC 5.4 3.4 - 10.8 x10E3/uL   RBC 3.84 3.77 - 5.28 x10E6/uL   Hemoglobin 12.9 11.1 - 15.9 g/dL   Hematocrit 37.8 34.0 - 46.6 %   MCV 98 (H) 79 - 97 fL   MCH 33.6 (H) 26.6 - 33.0 pg   MCHC 34.1 31.5 - 35.7 g/dL   RDW 13.5  11.7 - 15.4 %   Platelets 373 150 - 450 x10E3/uL   Neutrophils 44 Not Estab. %   Lymphs 41 Not Estab. %   Monocytes 8 Not Estab. %   Eos 6 Not Estab. %   Basos 1 Not Estab. %   Neutrophils Absolute 2.4 1.4 - 7.0 x10E3/uL   Lymphocytes Absolute 2.2 0.7 - 3.1 x10E3/uL   Monocytes Absolute 0.4 0.1 - 0.9 x10E3/uL   EOS (ABSOLUTE) 0.3 0.0 - 0.4 x10E3/uL   Basophils Absolute 0.1 0.0 - 0.2 x10E3/uL   Immature Granulocytes 0 Not Estab. %   Immature Grans (Abs) 0.0 0.0 - 0.1 x10E3/uL  Comprehensive metabolic panel  Result Value Ref Range   Glucose 97 65 - 99 mg/dL   BUN 14 8 - 27 mg/dL   Creatinine, Ser 0.68 0.57 - 1.00 mg/dL   eGFR 92 >59 mL/min/1.73   BUN/Creatinine Ratio 21 12 - 28   Sodium 140 134 - 144 mmol/L   Potassium 3.7 3.5 - 5.2 mmol/L   Chloride 101 96 - 106 mmol/L   CO2 26 20 - 29 mmol/L   Calcium 9.5 8.7 - 10.3 mg/dL   Total Protein 6.5 6.0 - 8.5 g/dL   Albumin 4.3 3.7 - 4.7 g/dL   Globulin, Total 2.2 1.5 - 4.5 g/dL   Albumin/Globulin Ratio 2.0 1.2 - 2.2   Bilirubin Total 0.6 0.0 - 1.2 mg/dL   Alkaline Phosphatase 48 44 - 121 IU/L   AST 16 0 - 40 IU/L   ALT 15 0 - 32 IU/L  Lipid Panel w/o Chol/HDL Ratio  Result Value Ref Range   Cholesterol, Total 171  100 - 199 mg/dL   Triglycerides 79 0 - 149 mg/dL   HDL 64 >39 mg/dL   VLDL Cholesterol Cal 15 5 - 40 mg/dL   LDL Chol Calc (NIH) 92 0 - 99 mg/dL  TSH  Result Value Ref Range   TSH 1.630 0.450 - 4.500 uIU/mL  Vitamin B12  Result Value Ref Range   Vitamin B-12 342 232 - 1,245 pg/mL  RPR  Result Value Ref Range   RPR Ser Ql Non Reactive Non Reactive      Assessment & Plan:   Problem List Items Addressed This Visit       Other   Generalized anxiety disorder    Chronic, ongoing.  Continue current medication regimen, has benefit from Buspar as needed.   Adjust regimen as needed.  Refills up to date.      Depression, major, single episode, moderate (HCC) - Primary    Chronic, stable on current medication  regimen. Denies SI/HI.  Continue Trazodone which benefits sleep and mood + Buspar as needed.  Adjust regimen as needed based on mood and scores.  Refills up to date.        Left shoulder pain    Chronic, ongoing, being followed by ortho at this time.  Continue this collaboration and review notes when available.  Referral to shoulder specialist placed.      Relevant Orders   Ambulatory referral to Orthopedics   Memory changes    MMSE recently 27/30, mild recall changes.  ?memory issues related more with increased anxiety and stressors.  Recent labs unremarkable.  She declines imaging or neurology at this time, until after her shoulder is improved.  Return as scheduled in October.        Follow up plan: Return for as scheduled on October.

## 2021-04-26 NOTE — Assessment & Plan Note (Signed)
MMSE recently 27/30, mild recall changes.  ?memory issues related more with increased anxiety and stressors.  Recent labs unremarkable.  She declines imaging or neurology at this time, until after her shoulder is improved.  Return as scheduled in October.

## 2021-04-26 NOTE — Assessment & Plan Note (Signed)
Chronic, ongoing, being followed by ortho at this time.  Continue this collaboration and review notes when available.  Referral to shoulder specialist placed.

## 2021-04-26 NOTE — Assessment & Plan Note (Signed)
Chronic, stable on current medication regimen. Denies SI/HI.  Continue Trazodone which benefits sleep and mood + Buspar as needed.  Adjust regimen as needed based on mood and scores.  Refills up to date.   

## 2021-04-26 NOTE — Assessment & Plan Note (Signed)
Chronic, ongoing.  Continue current medication regimen, has benefit from Buspar as needed.   Adjust regimen as needed.  Refills up to date. 

## 2021-04-26 NOTE — Patient Instructions (Signed)

## 2021-06-09 ENCOUNTER — Other Ambulatory Visit: Payer: Self-pay | Admitting: Nurse Practitioner

## 2021-06-09 NOTE — Telephone Encounter (Signed)
Requested Prescriptions  Pending Prescriptions Disp Refills  . alendronate (FOSAMAX) 70 MG tablet [Pharmacy Med Name: Alendronate Sodium 70 MG Oral Tablet] 4 tablet 3    Sig: TAKE 1 TABLET BY MOUTH ONCE A WEEK WITH  A  FULL  GLASS  OF  WATER  ON  AN  EMPTY  STOMACH     Endocrinology:  Bisphosphonates Failed - 06/09/2021  9:53 AM      Failed - Vitamin D in normal range and within 360 days    Vit D, 25-Hydroxy  Date Value Ref Range Status  06/09/2020 36.9 30.0 - 100.0 ng/mL Final    Comment:    Vitamin D deficiency has been defined by the Institute of Medicine and an Endocrine Society practice guideline as a level of serum 25-OH vitamin D less than 20 ng/mL (1,2). The Endocrine Society went on to further define vitamin D insufficiency as a level between 21 and 29 ng/mL (2). 1. IOM (Institute of Medicine). 2010. Dietary reference    intakes for calcium and D. [place]: The    [employer]. 2. Holick MF, Binkley [place], Bischoff-Ferrari HA, et al.    Evaluation, treatment, and prevention of vitamin D    deficiency: an Endocrine Society clinical practice    guideline. JCEM. 2011 Jul; 96(7):1911-30.          Passed - Ca in normal range and within 360 days    Calcium  Date Value Ref Range Status  03/01/2021 9.5 8.7 - 10.3 mg/dL Final         Passed - Valid encounter within last 12 months    Recent Outpatient Visits          1 month ago Depression, major, single episode, moderate (HCC)   [facility] [place], Jolene T, NP   3 months ago Depression, major, single episode, moderate (HCC)   [facility] [place], Hecker T, NP   5 months ago Acute pain of left shoulder   [facility] [place], [place] T, NP   6 months ago Depression, major, single episode, moderate (HCC)   [facility] [place], Corrie Dandy T, NP   1 year ago Routine general medical examination at a health care facility   [facility], Dorie Rank,  NP      Future Appointments            In 1 week Cannady, Dorie Rank, NP [facility]   In 3 weeks  [facility]

## 2021-06-10 DIAGNOSIS — Z96612 Presence of left artificial shoulder joint: Secondary | ICD-10-CM | POA: Diagnosis not present

## 2021-06-12 ENCOUNTER — Other Ambulatory Visit: Payer: Self-pay | Admitting: Nurse Practitioner

## 2021-06-16 ENCOUNTER — Encounter: Payer: Medicare Other | Admitting: Nurse Practitioner

## 2021-07-04 ENCOUNTER — Ambulatory Visit (INDEPENDENT_AMBULATORY_CARE_PROVIDER_SITE_OTHER): Payer: Medicare Other | Admitting: Nurse Practitioner

## 2021-07-04 DIAGNOSIS — Z0000 Encounter for general adult medical examination without abnormal findings: Secondary | ICD-10-CM | POA: Diagnosis not present

## 2021-07-04 NOTE — Progress Notes (Signed)
Subjective:   Emily Watson is a 75 y.o. female who presents for Medicare Annual (Subsequent) preventive examination.  I connected with  [name] on 07/04/21 by a telephone enabled telemedicine application and verified that I am speaking with the correct person using two identifiers.   I discussed the limitations of evaluation and management by telemedicine. The patient expressed understanding and agreed to proceed.   Patient location :  at home  Tele-Health Visit  not in office    Review of Systems     Cardiac Risk Factors include: advanced age (>3mn, >>61women);hypertension;obesity (BMI >30kg/m2)     Objective:    Today's Vitals   There is no height or weight on file to calculate BMI.  Advanced Directives 07/04/2021 07/02/2020 06/26/2019 06/18/2017 03/27/2017 03/14/2017  Does Patient Have a Medical Advance Directive? No No No No No No  Does patient want to make changes to medical advance directive? - - - Yes (MAU/Ambulatory/Procedural Areas - Information given) - -  Would patient like information on creating a medical advance directive? No - Patient declined - - - No - Patient declined -    Current Medications (verified) Outpatient Encounter Medications as of 07/04/2021  Medication Sig   alendronate (FOSAMAX) 70 MG tablet TAKE 1 TABLET BY MOUTH ONCE A WEEK WITH  A  FULL  GLASS  OF  WATER  ON  AN  EMPTY  STOMACH   aspirin 81 MG EC tablet Take 81 mg by mouth daily. Swallow whole.   atorvastatin (LIPITOR) 10 MG tablet TAKE 1 TABLET BY MOUTH ONCE DAILY AT 6PM   busPIRone (BUSPAR) 5 MG tablet Take 1 tablet by mouth twice daily as needed for anxiety   Cholecalciferol (VITAMIN D3 PO) Take by mouth daily. 2000 IU (511m)   omeprazole (PRILOSEC OTC) 20 MG tablet Take 1 tablet (20 mg total) by mouth daily.   traZODone (DESYREL) 50 MG tablet Take 1 tablet (50 mg total) by mouth at bedtime.   UNABLE TO FIND in the morning and at bedtime. Med Name: alpha brain   BINAXNOW  COVID-19 AG HOME TEST KIT Use as Directed on the Package (Patient not taking: Reported on 07/04/2021)   donepezil (ARICEPT) 5 MG tablet Take 1 tablet (5 mg total) by mouth at bedtime. (Patient not taking: Reported on 07/04/2021)   hydrOXYzine (ATARAX/VISTARIL) 10 MG tablet Take 1 tablet (10 mg total) by mouth every 8 (eight) hours as needed for itching. (Patient not taking: Reported on 07/04/2021)   No facility-administered encounter medications on file as of 07/04/2021.    Allergies (verified) Codeine, Chlorpheniramine-phenylephrine, Gabapentin, Other, Peppermint oil, and Triprolidine-pseudoephedrine   History: Past Medical History:  Diagnosis Date   Allergy    Anaphylaxis    Anxiety    Arthritis    Carpal tunnel syndrome    Depression    GERD (gastroesophageal reflux disease)    Hypertension    patient denies   Peripheral edema    Trigger thumb of left hand    Past Surgical History:  Procedure Laterality Date   CARPAL TUNNEL RELEASE     HERNIA REPAIR  04/2009   ROTATOR CUFF REPAIR Left    TOTAL KNEE ARTHROPLASTY Left 03/27/2017   Procedure: TOTAL KNEE ARTHROPLASTY - LEFT;  Surgeon: MeHessie KnowsMD;  Location: [facility] ORS;  Service: Orthopedics;  Laterality: Left;   Family History  Problem Relation Age of Onset   Kidney disease Mother    Cancer Mother    Kidney disease Father  Cancer Father    Cancer Sister        bone   Hypertension Sister    Osteoporosis Sister    Breast cancer Sister    Other Son 55       brain tumor   Alzheimer's disease Sister    Hypertension Brother    Social History   Socioeconomic History   Marital status: Divorced    Spouse name: Not on file   Number of children: Not on file   Years of education: Not on file   Highest education level: High school graduate  Occupational History   Occupation: retired  Tobacco Use   Smoking status: Former    Types: Cigarettes    Quit date: 03/14/1997    Years since quitting: 24.3   Smokeless  tobacco: Never  Vaping Use   Vaping Use: Never used  Substance and Sexual Activity   Alcohol use: No    Alcohol/week: 0.0 standard drinks   Drug use: No   Sexual activity: Not Currently  Other Topics Concern   Not on file  Social History Narrative   Not on file   Social Determinants of Health   Financial Resource Strain: Low Risk    Difficulty of Paying Living Expenses: Not hard at all  Food Insecurity: No Food Insecurity   Worried About Charity fundraiser in the Last Year: Never true   [place] in the Last Year: Never true  Transportation Needs: No Transportation Needs   Lack of Transportation (Medical): No   Lack of Transportation (Non-Medical): No  Physical Activity: Inactive   Days of Exercise per Week: 0 days   Minutes of Exercise per Session: 0 min  Stress: No Stress Concern Present   Feeling of Stress : Not at all  Social Connections: Socially Isolated   Frequency of Communication with Friends and Family: More than three times a week   Frequency of Social Gatherings with Friends and Family: More than three times a week   Attends Religious Services: Never   Marine scientist or Organizations: No   Attends Music therapist: Never   Marital Status: Divorced    Tobacco Counseling Counseling given: Not Answered   Clinical Intake:  Pre-visit preparation completed: Yes  Pain : No/denies pain     Nutritional Risks: None Diabetes: No  How often do you need to have someone help you when you read instructions, pamphlets, or other written materials from your doctor or pharmacy?: 1 - Never  Diabetic?   no  Interpreter Needed?: No  Information entered by :: Leroy Kennedy LPN   Activities of Daily Living In your present state of health, do you have any difficulty performing the following activities: 07/04/2021  Hearing? N  Vision? N  Difficulty concentrating or making decisions? N  Walking or climbing stairs? Y  Dressing or bathing? N   Doing errands, shopping? N  Preparing Food and eating ? N  Using the Toilet? N  In the past six months, have you accidently leaked urine? N  Do you have problems with loss of bowel control? N  Managing your Medications? N  Managing your Finances? N  Housekeeping or managing your Housekeeping? N  Some recent data might be hidden    Patient Care Team: Venita Lick, NP as PCP - General (Nurse Practitioner) Hessie Knows, MD as Consulting Physician (Orthopedic Surgery)  Indicate any recent Medical Services you may have received from other than [facility] providers in  the past year (date may be approximate).     Assessment:   This is a routine wellness examination for Elianie.  Hearing/Vision screen Hearing Screening - Comments:: No trouble hearing Vision Screening - Comments:: Not up to date Unsure of name  Dietary issues and exercise activities discussed: Current Exercise Habits: The patient does not participate in regular exercise at present, Exercise limited by: orthopedic condition(s)   Goals Addressed             This Visit's Progress    Patient Stated       No goals       Depression Screen PHQ 2/9 Scores 07/04/2021 04/26/2021 03/01/2021 12/08/2020 07/02/2020 06/09/2020 12/09/2019  PHQ - 2 Score 0 0 0 0 0 0 0  PHQ- 9 Score - 0 0 2 0 0 0    Fall Risk Fall Risk  07/02/2020 06/09/2020 06/26/2019 10/22/2018 07/09/2018  Falls in the past year? 0 0 0 0 0  Comment - - - - Emmi Telephone Survey: data to providers prior to load  Number falls in past yr: - 0 0 0 -  Injury with Fall? - 0 0 0 -  Risk for fall due to : Medication side effect No Fall Risks - - -  Follow up Falls evaluation completed;Education provided;Falls prevention discussed Falls evaluation completed - - -    FALL RISK PREVENTION PERTAINING TO THE HOME:  Any stairs in or around the home? No  If so, are there any without handrails? No  Home free of loose throw rugs in walkways, pet beds, electrical cords,  etc? Yes  Adequate lighting in your home to reduce risk of falls? Yes   ASSISTIVE DEVICES UTILIZED TO PREVENT FALLS:  Life alert? No  Use of a cane, walker or w/c? Yes  Grab bars in the bathroom? No  Shower chair or bench in shower? No  Elevated toilet seat or a handicapped toilet? No   TIMED UP AND GO:  Was the test performed? No .    Cognitive Function:  Normal cognitive status assessed by direct observation by this Nurse Health Advisor. No abnormalities found.   MMSE - Mini Mental State Exam 03/01/2021  Orientation to time 5  Orientation to Place 4  Registration 3  Attention/ Calculation 5  Recall 1  Language- name 2 objects 2  Language- repeat 1  Language- follow 3 step command 3  Language- read & follow direction 1  Write a sentence 1  Copy design 1  Total score 27     6CIT Screen 07/02/2020 06/09/2020 06/19/2018 06/18/2017  What Year? 0 points 0 points 0 points 0 points  What month? 0 points 0 points 0 points 0 points  What time? 0 points 0 points 0 points 0 points  Count back from 20 0 points 0 points 0 points 0 points  Months in reverse 0 points 2 points 0 points 0 points  Repeat phrase 2 points 10 points 0 points 4 points  Total Score 2 12 0 4    Immunizations Immunization History  Administered Date(s) Administered   Fluad Quad(high Dose 65+) 06/10/2019, 06/09/2020   Influenza, High Dose Seasonal PF 06/08/2016, 05/21/2017, 04/29/2018   Influenza,inj,Quad PF,6+ Mos 06/04/2015   Moderna Sars-Covid-2 Vaccination 11/07/2019, 12/05/2019, 07/22/2020   Pneumococcal Conjugate-13 07/20/2014   Pneumococcal Polysaccharide-23 08/03/2015   Td 08/21/2005    TDAP status: Due, Education has been provided regarding the importance of this vaccine. Advised may receive this vaccine at local pharmacy or Health  Dept. Aware to provide a copy of the vaccination record if obtained from local pharmacy or Health Dept. Verbalized acceptance and understanding.  Flu Vaccine  status: Due, Education has been provided regarding the importance of this vaccine. Advised may receive this vaccine at local pharmacy or Health Dept. Aware to provide a copy of the vaccination record if obtained from local pharmacy or Health Dept. Verbalized acceptance and understanding.  Pneumococcal vaccine status: Up to date  Covid-19 vaccine status: Information provided on how to obtain vaccines.   Qualifies for Shingles Vaccine? Yes   Zostavax completed No   Shingrix Completed?: No.    Education has been provided regarding the importance of this vaccine. Patient has been advised to call insurance company to determine out of pocket expense if they have not yet received this vaccine. Advised may also receive vaccine at local pharmacy or Health Dept. Verbalized acceptance and understanding.  Screening Tests Health Maintenance  Topic Date Due   COLONOSCOPY (Pts 45-47yr Insurance coverage will need to be confirmed)  Never done   TETANUS/TDAP  08/22/2015   COVID-19 Vaccine (4 - Booster for Moderna series) 09/16/2020   INFLUENZA VACCINE  03/21/2021   Zoster Vaccines- Shingrix (1 of 2) 07/26/2021 (Originally 05/07/1998)   MAMMOGRAM  11/13/2022   Pneumonia Vaccine 75 Years old  Completed   DEXA SCAN  Completed   Hepatitis C Screening  Completed   HPV VACCINES  Aged Out    Health Maintenance  Health Maintenance Due  Topic Date Due   COLONOSCOPY (Pts 45-458yrInsurance coverage will need to be confirmed)  Never done   TETANUS/TDAP  08/22/2015   COVID-19 Vaccine (4 - Booster for Moderna series) 09/16/2020   INFLUENZA VACCINE  03/21/2021    Colonoscopy   Patient declined  Mammogram status: Completed  . Repeat every year  Bone Density status: Completed 2021. Results reflect: Bone density results: OSTEOPOROSIS. Repeat every 2 years.  Lung Cancer Screening: (Low Dose CT Chest recommended if Age 73-80ears, 30 pack-year currently smoking OR have quit w/in 15years.) does not qualify.    Lung Cancer Screening Referral:   Additional Screening:  Hepatitis C Screening: does not qualify; Completed 2020  Vision Screening: Recommended annual ophthalmology exams for early detection of glaucoma and other disorders of the eye. Is the patient up to date with their annual eye exam?  No  Who is the provider or what is the name of the office in which the patient attends annual eye exams? Unsure of name If pt is not established with a provider, would they like to be referred to a provider to establish care? No .   Dental Screening: Recommended annual dental exams for proper oral hygiene  Community Resource Referral / Chronic Care Management: CRR required this visit?  No   CCM required this visit?  No      Plan:     I have personally reviewed and noted the following in the patient's chart:   Medical and social history Use of alcohol, tobacco or illicit drugs  Current medications and supplements including opioid prescriptions.  Functional ability and status Nutritional status Physical activity Advanced directives List of other physicians Hospitalizations, surgeries, and ER visits in previous 12 months Vitals Screenings to include cognitive, depression, and falls Referrals and appointments  In addition, I have reviewed and discussed with patient certain preventive protocols, quality metrics, and best practice recommendations. A written personalized care plan for preventive services as well as general preventive health recommendations were provided to  patient.     Leroy Kennedy, LPN   [id number]   Nurse Notes:

## 2021-07-04 NOTE — Patient Instructions (Signed)
[name] , Thank you for taking time to come for your Medicare Wellness Visit. I appreciate your ongoing commitment to your health goals. Please review the following plan we discussed and let me know if I can assist you in the future.   Screening recommendations/referrals: Colonoscopy:  Education provided Mammogram: up to date Bone Density: up to date Recommended yearly ophthalmology/optometry visit for glaucoma screening and checkup Recommended yearly dental visit for hygiene and checkup  Vaccinations: Influenza vaccine: Education provided Pneumococcal vaccine: up to date Tdap vaccine: Education provided Shingles vaccine: Education provided    Advanced directives: Education provided  Conditions/risks identified:   Next appointment: 08-17-2021 @ 3:00  Cannady   Preventive Care 65 Years and Older, Female Preventive care refers to lifestyle choices and visits with your health care provider that can promote health and wellness. What does preventive care include? A yearly physical exam. This is also called an annual well check. Dental exams once or twice a year. Routine eye exams. Ask your health care provider how often you should have your eyes checked. Personal lifestyle choices, including: Daily care of your teeth and gums. Regular physical activity. Eating a healthy diet. Avoiding tobacco and drug use. Limiting alcohol use. Practicing safe sex. Taking low-dose aspirin every day. Taking vitamin and mineral supplements as recommended by your health care provider. What happens during an annual well check? The services and screenings done by your health care provider during your annual well check will depend on your age, overall health, lifestyle risk factors, and family history of disease. Counseling  Your health care provider may ask you questions about your: Alcohol use. Tobacco use. Drug use. Emotional well-being. Home and relationship well-being. Sexual  activity. Eating habits. History of falls. Memory and ability to understand (cognition). Work and work Astronomer. Reproductive health. Screening  You may have the following tests or measurements: Height, weight, and BMI. Blood pressure. Lipid and cholesterol levels. These may be checked every 5 years, or more frequently if you are over 73 years old. Skin check. Lung cancer screening. You may have this screening every year starting at age 10 if you have a 30-pack-year history of smoking and currently smoke or have quit within the past 15 years. Fecal occult blood test (FOBT) of the stool. You may have this test every year starting at age 73. Flexible sigmoidoscopy or colonoscopy. You may have a sigmoidoscopy every 5 years or a colonoscopy every 10 years starting at age 43. Hepatitis C blood test. Hepatitis B blood test. Sexually transmitted disease (STD) testing. Diabetes screening. This is done by checking your blood sugar (glucose) after you have not eaten for a while (fasting). You may have this done every 1-3 years. Bone density scan. This is done to screen for osteoporosis. You may have this done starting at age 41. Mammogram. This may be done every 1-2 years. Talk to your health care provider about how often you should have regular mammograms. Talk with your health care provider about your test results, treatment options, and if necessary, the need for more tests. Vaccines  Your health care provider may recommend certain vaccines, such as: Influenza vaccine. This is recommended every year. Tetanus, diphtheria, and acellular pertussis (Tdap, Td) vaccine. You may need a Td booster every 10 years. Zoster vaccine. You may need this after age 36. Pneumococcal 13-valent conjugate (PCV13) vaccine. One dose is recommended after age 56. Pneumococcal polysaccharide (PPSV23) vaccine. One dose is recommended after age 28. Talk to your health care provider about  which screenings and vaccines  you need and how often you need them. This information is not intended to replace advice given to you by your health care provider. Make sure you discuss any questions you have with your health care provider. Document Released: 09/03/2015 Document Revised: 04/26/2016 Document Reviewed: 06/08/2015 Elsevier Interactive Patient Education  2017 [employer] Prevention in the Home Falls can cause injuries. They can happen to people of all ages. There are many things you can do to make your home safe and to help prevent falls. What can I do on the outside of my home? Regularly fix the edges of walkways and driveways and fix any cracks. Remove anything that might make you trip as you walk through a door, such as a raised step or threshold. Trim any bushes or trees on the path to your home. Use bright outdoor lighting. Clear any walking paths of anything that might make someone trip, such as rocks or tools. Regularly check to see if handrails are loose or broken. Make sure that both sides of any steps have handrails. Any raised decks and porches should have guardrails on the edges. Have any leaves, snow, or ice cleared regularly. Use sand or salt on walking paths during winter. Clean up any spills in your garage right away. This includes oil or grease spills. What can I do in the bathroom? Use night lights. Install grab bars by the toilet and in the tub and shower. Do not use towel bars as grab bars. Use non-skid mats or decals in the tub or shower. If you need to sit down in the shower, use a plastic, non-slip stool. Keep the floor dry. Clean up any water that spills on the floor as soon as it happens. Remove soap buildup in the tub or shower regularly. Attach bath mats securely with double-sided non-slip rug tape. Do not have throw rugs and other things on the floor that can make you trip. What can I do in the bedroom? Use night lights. Make sure that you have a light by your bed that  is easy to reach. Do not use any sheets or blankets that are too big for your bed. They should not hang down onto the floor. Have a firm chair that has side arms. You can use this for support while you get dressed. Do not have throw rugs and other things on the floor that can make you trip. What can I do in the kitchen? Clean up any spills right away. Avoid walking on wet floors. Keep items that you use a lot in easy-to-reach places. If you need to reach something above you, use a strong step stool that has a grab bar. Keep electrical cords out of the way. Do not use floor polish or wax that makes floors slippery. If you must use wax, use non-skid floor wax. Do not have throw rugs and other things on the floor that can make you trip. What can I do with my stairs? Do not leave any items on the stairs. Make sure that there are handrails on both sides of the stairs and use them. Fix handrails that are broken or loose. Make sure that handrails are as long as the stairways. Check any carpeting to make sure that it is firmly attached to the stairs. Fix any carpet that is loose or worn. Avoid having throw rugs at the top or bottom of the stairs. If you do have throw rugs, attach them to the floor with  carpet tape. Make sure that you have a light switch at the top of the stairs and the bottom of the stairs. If you do not have them, ask someone to add them for you. What else can I do to help prevent falls? Wear shoes that: Do not have high heels. Have rubber bottoms. Are comfortable and fit you well. Are closed at the toe. Do not wear sandals. If you use a stepladder: Make sure that it is fully opened. Do not climb a closed stepladder. Make sure that both sides of the stepladder are locked into place. Ask someone to hold it for you, if possible. Clearly mark and make sure that you can see: Any grab bars or handrails. First and last steps. Where the edge of each step is. Use tools that help you  move around (mobility aids) if they are needed. These include: Canes. Walkers. Scooters. Crutches. Turn on the lights when you go into a dark area. Replace any light bulbs as soon as they burn out. Set up your furniture so you have a clear path. Avoid moving your furniture around. If any of your floors are uneven, fix them. If there are any pets around you, be aware of where they are. Review your medicines with your doctor. Some medicines can make you feel dizzy. This can increase your chance of falling. Ask your doctor what other things that you can do to help prevent falls. This information is not intended to replace advice given to you by your health care provider. Make sure you discuss any questions you have with your health care provider. Document Released: 06/03/2009 Document Revised: 01/13/2016 Document Reviewed: 09/11/2014 Elsevier Interactive Patient Education  2017 [employer].

## 2021-07-24 ENCOUNTER — Other Ambulatory Visit: Payer: Self-pay | Admitting: Nurse Practitioner

## 2021-07-24 DIAGNOSIS — F321 Major depressive disorder, single episode, moderate: Secondary | ICD-10-CM

## 2021-07-24 NOTE — Telephone Encounter (Signed)
last RF 06/09/20 #90 4 RF- should have enough med to last until 08/2021  Requested Prescriptions  Refused Prescriptions Disp Refills  . traZODone (DESYREL) 50 MG tablet [Pharmacy Med Name: traZODone HCl 50 MG Oral Tablet] 90 tablet 0    Sig: TAKE 1 TABLET BY MOUTH AT BEDTIME     Psychiatry: Antidepressants - Serotonin Modulator Passed - 07/24/2021 10:43 AM      Passed - Completed PHQ-2 or PHQ-9 in the last 360 days      Passed - Valid encounter within last 6 months    Recent Outpatient Visits          2 months ago Depression, major, single episode, moderate (HCC)   [facility] [place], Jolene T, NP   4 months ago Depression, major, single episode, moderate (HCC)   [facility] [place], Goodman T, NP   6 months ago Acute pain of left shoulder   [facility] [place], [place] T, NP   7 months ago Depression, major, single episode, moderate (HCC)   [facility] [place], Corrie Dandy T, NP   1 year ago Routine general medical examination at a health care facility   [facility], Dorie Rank, NP      Future Appointments            In 3 weeks Cannady, Dorie Rank, NP [employer] [facility], [facility]

## 2021-07-25 ENCOUNTER — Other Ambulatory Visit: Payer: Self-pay | Admitting: Nurse Practitioner

## 2021-07-25 DIAGNOSIS — F321 Major depressive disorder, single episode, moderate: Secondary | ICD-10-CM

## 2021-07-26 NOTE — Telephone Encounter (Signed)
Call to pharmacy- last filled 01/08/21 #90 and she does have 3 RF available to her. OK for denial- too soon

## 2021-07-26 NOTE — Telephone Encounter (Signed)
Pt should have supply until 09/09/21. Attempted to reach pt to verify, left VM to CB. Also, pharmacy no additional refills available.

## 2021-07-27 ENCOUNTER — Other Ambulatory Visit: Payer: Self-pay

## 2021-07-27 DIAGNOSIS — F321 Major depressive disorder, single episode, moderate: Secondary | ICD-10-CM

## 2021-07-27 MED ORDER — TRAZODONE HCL 50 MG PO TABS
50.0000 mg | ORAL_TABLET | Freq: Every day | ORAL | 4 refills | Status: DC
Start: 2021-07-27 — End: 2021-08-17

## 2021-08-17 ENCOUNTER — Other Ambulatory Visit: Payer: Self-pay

## 2021-08-17 ENCOUNTER — Ambulatory Visit (INDEPENDENT_AMBULATORY_CARE_PROVIDER_SITE_OTHER): Payer: Medicare Other | Admitting: Nurse Practitioner

## 2021-08-17 ENCOUNTER — Encounter: Payer: Self-pay | Admitting: Nurse Practitioner

## 2021-08-17 VITALS — BP 117/79 | HR 76 | Temp 98.3°F | Ht 61.0 in | Wt 147.0 lb

## 2021-08-17 DIAGNOSIS — F321 Major depressive disorder, single episode, moderate: Secondary | ICD-10-CM | POA: Diagnosis not present

## 2021-08-17 DIAGNOSIS — F5101 Primary insomnia: Secondary | ICD-10-CM

## 2021-08-17 DIAGNOSIS — Z23 Encounter for immunization: Secondary | ICD-10-CM | POA: Diagnosis not present

## 2021-08-17 DIAGNOSIS — R413 Other amnesia: Secondary | ICD-10-CM

## 2021-08-17 DIAGNOSIS — M419 Scoliosis, unspecified: Secondary | ICD-10-CM | POA: Insufficient documentation

## 2021-08-17 DIAGNOSIS — M4012 Other secondary kyphosis, cervical region: Secondary | ICD-10-CM

## 2021-08-17 DIAGNOSIS — F411 Generalized anxiety disorder: Secondary | ICD-10-CM

## 2021-08-17 DIAGNOSIS — I10 Essential (primary) hypertension: Secondary | ICD-10-CM

## 2021-08-17 DIAGNOSIS — M4154 Other secondary scoliosis, thoracic region: Secondary | ICD-10-CM | POA: Diagnosis not present

## 2021-08-17 DIAGNOSIS — K219 Gastro-esophageal reflux disease without esophagitis: Secondary | ICD-10-CM | POA: Diagnosis not present

## 2021-08-17 DIAGNOSIS — Z0000 Encounter for general adult medical examination without abnormal findings: Secondary | ICD-10-CM | POA: Diagnosis not present

## 2021-08-17 DIAGNOSIS — E538 Deficiency of other specified B group vitamins: Secondary | ICD-10-CM | POA: Diagnosis not present

## 2021-08-17 DIAGNOSIS — E7800 Pure hypercholesterolemia, unspecified: Secondary | ICD-10-CM | POA: Diagnosis not present

## 2021-08-17 DIAGNOSIS — M810 Age-related osteoporosis without current pathological fracture: Secondary | ICD-10-CM | POA: Diagnosis not present

## 2021-08-17 DIAGNOSIS — D519 Vitamin B12 deficiency anemia, unspecified: Secondary | ICD-10-CM | POA: Diagnosis not present

## 2021-08-17 DIAGNOSIS — M40209 Unspecified kyphosis, site unspecified: Secondary | ICD-10-CM | POA: Insufficient documentation

## 2021-08-17 LAB — MICROALBUMIN, URINE WAIVED
Creatinine, Urine Waived: 200 mg/dL (ref 10–300)
Microalb, Ur Waived: 30 mg/L — ABNORMAL HIGH (ref 0–19)
Microalb/Creat Ratio: <30 mg/g (ref <30)

## 2021-08-17 MED ORDER — ATORVASTATIN CALCIUM 10 MG PO TABS: ORAL_TABLET | ORAL | 4 refills | Status: DC

## 2021-08-17 MED ORDER — BUSPIRONE HCL 5 MG PO TABS: 5.0000 mg | ORAL_TABLET | Freq: Two times a day (BID) | ORAL | 4 refills | Status: DC | PRN

## 2021-08-17 NOTE — Assessment & Plan Note (Signed)
With underlying osteoporosis, noted to thoracic and lumbar spine.  No pain.  At this time recommend low resistance exercises at home to maintain strength and prevent discomfort.  Consider PT is pain presents.

## 2021-08-17 NOTE — Assessment & Plan Note (Signed)
Noted on DEXA March 2021.  She is taking daily Calcium and Vit D3, to continue this.  Continue Fosamax, educated her at length on this.  Repeat DEXA in March 2023.  Monitor for fractures or falls.  Vit D level today.

## 2021-08-17 NOTE — Assessment & Plan Note (Addendum)
Chronic, diet-controlled, stable with BP at goal recent visits and at home.  Continue monitoring BP at home 3 mornings a week and documenting for provider.  Will monitor and initiate medications if consistently elevated.  CMP, CBC, TSH, urine ALB (30 on check, mild protein) today.

## 2021-08-17 NOTE — Assessment & Plan Note (Signed)
Chronic, stable with only occasional Buspar use, is doing well at this time without Trazodone. Denies SI/HI.  Continue Buspar as needed.  Adjust regimen as needed based on mood and scores.  Refills sent in.

## 2021-08-17 NOTE — Patient Instructions (Addendum)
Please call to schedule your mammogram and/or bone density: [facility]  Address: [address] [place], [place], [place] [address]  Phone: [phone]  Bone scan and mammogram 11/17/21 due  Bone Density Test A bone density test uses a type of X-ray to measure the amount of calcium and other minerals in a person's bones. It can measure bone density in the hip and the spine. The test is similar to having a regular X-ray. This test may also be called: Bone densitometry. Bone mineral density test. Dual-energy X-ray absorptiometry (DEXA). You may have this test to: Diagnose a condition that causes weak or thin bones (osteoporosis). Screen you for osteoporosis. Predict your risk for a broken bone (fracture). Determine how well your osteoporosis treatment is working. Tell a health care provider about: Any allergies you have. All medicines you are taking, including vitamins, herbs, eye drops, creams, and over-the-counter medicines. Any problems you or family members have had with anesthetic medicines. Any blood disorders you have. Any surgeries you have had. Any medical conditions you have. Whether you are pregnant or may be pregnant. Any medical tests you have had within the past 14 days that used contrast material. What are the risks? Generally, this is a safe test. However, it does expose you to a small amount of radiation, which can slightly increase your cancer risk. What happens before the test? Do not take any calcium supplements within the 24 hours before your test. You will need to remove all metal jewelry, eyeglasses, removable dental appliances, and any other metal objects on your body. What happens during the test?  You will lie down on an exam table. There will be an X-ray generator below you and an imaging device above you. Other devices, such as boxes or braces, may be used to position your body properly for the scan. The machine will slowly  scan your body. You will need to keep very still while the machine does the scan. The images will show up on a screen in the room. Images will be examined by a specialist after your test is finished. The procedure may vary among health care providers and hospitals. What can I expect after the test? It is up to you to get the results of your test. Ask your health care provider, or the department that is doing the test, when your results will be ready. Summary A bone density test is an imaging test that uses a type of X-ray to measure the amount of calcium and other minerals in your bones. The test may be used to diagnose or screen you for a condition that causes weak or thin bones (osteoporosis), predict your risk for a broken bone (fracture), or determine how well your osteoporosis treatment is working. Do not take any calcium supplements within 24 hours before your test. Ask your health care provider, or the department that is doing the test, when your results will be ready. This information is not intended to replace advice given to you by your health care provider. Make sure you discuss any questions you have with your health care provider. Document Revised: 04/20/2021 Document Reviewed: 01/22/2020 Elsevier Patient Education  2022 [employer].

## 2021-08-17 NOTE — Assessment & Plan Note (Signed)
Chronic, ongoing.  Continue current medication regimen and adjust as needed. Lipid panel today. 

## 2021-08-17 NOTE — Assessment & Plan Note (Signed)
Doing well without Trazodone at this time, restart as needed.  Due to age will try to keep medication regimen <11 medications. 

## 2021-08-17 NOTE — Assessment & Plan Note (Signed)
Chronic, stable with Prilosec.  Denies any symptoms at this time and no use of TUMS. Continue current medication regimen and adjust as needed.  Mag level annually.

## 2021-08-17 NOTE — Assessment & Plan Note (Signed)
MMSE 27/30 on most recent check, suspect concerns were more anxiety at time as was having difficulty in relationship with daughter.  Her son reports no concerns today.  Continue to monitor and restart memory aide if needed.

## 2021-08-17 NOTE — Progress Notes (Signed)
BP 117/79    Pulse 76    Temp 98.3 F (36.8 C)    Ht  (1.549 m)    Wt 147 lb (66.7 kg)    LMP 02/01/1991 (Approximate)    SpO2 97%    BMI 27.78 kg/m    Subjective:    Patient ID: Emily Watson, female    DOB: 09-24-1945, 75 y.o.   MRN: [id number]  HPI: Emily Watson is a 75 y.o. female presenting on 08/17/2021 for comprehensive medical examination. Current medical complaints include:none  She currently lives with: self Menopausal Symptoms: no  Son at bedside with her today.  No memory issues at this time, not taking medication. MMSE - Mini Mental State Exam 03/01/2021  Orientation to time 5  Orientation to Place 4  Registration 3  Attention/ Calculation 5  Recall 1  Language- name 2 objects 2  Language- repeat 1  Language- follow 3 step command 3  Language- read & follow direction 1  Write a sentence 1  Copy design 1  Total score 27    HYPERTENSION / HYPERLIPIDEMIA Continues on Lipitor for cholesterol, is tolerating without ADR. No current BP medications.  Has been focused on diet and weight loss.    She continues to take daily Omeprazole for GERD with no issues. Satisfied with current treatment? yes Duration of hypertension: chronic BP monitoring frequency: a couple times a week BP range: 120/70 range at home Duration of hyperlipidemia: chronic Cholesterol medication side effects: no Cholesterol supplements: none Medication compliance: good compliance Aspirin: yes Recent stressors: no Recurrent headaches: no Visual changes: no Palpitations: no Dyspnea: no Chest pain: no Lower extremity edema: no Dizzy/lightheaded: no    OSTEOPOROSIS Last DEXA scan was March 2021 -- did note osteoporosis with T Score -3.0. Started on Fosamax at that time and has tolerated. She is taking daily supplements, Vit D and Calcium. Satisfied with current treatment?: yes Medication side effects: no Medication compliance: good compliance Past osteoporosis  medications/treatments:  Adequate calcium & vitamin D: yes Intolerance to bisphosphonates:no Weight bearing exercises: yes  DEPRESSION/ANXIETY Has stopped Trazodone, quit over a week ago.   Has Buspar to take as needed.   Mood status: stable Satisfied with current treatment?: yes Symptom severity: mild  Duration of current treatment : chronic Side effects: no Medication compliance: good compliance Psychotherapy/counseling: none Depressed mood: occasional Anxious mood: occasional Anhedonia: no Significant weight loss or gain: no Insomnia: none  Fatigue: no Feelings of worthlessness or guilt: no Impaired concentration/indecisiveness: no Suicidal ideations: no Hopelessness: no Crying spells: no Depression screen [facility] 2/9 08/17/2021 07/04/2021 04/26/2021 03/01/2021 12/08/2020  Decreased Interest 0 0 0 - 0  Down, Depressed, Hopeless 1 0 0 0 0  PHQ - 2 Score 1 0 0 0 0  Altered sleeping 0 - 0 0 0  Tired, decreased energy 0 - 0 0 1  Change in appetite 0 - 0 0 0  Feeling bad or failure about yourself  0 - 0 0 0  Trouble concentrating 0 - 0 0 0  Moving slowly or fidgety/restless 0 - 0 0 1  Suicidal thoughts 0 - 0 0 0  PHQ-9 Score 1 - 0 0 2  Difficult doing work/chores Not difficult at all - Not difficult at all Not difficult at all -  Some recent data might be hidden   GAD 7 : Generalized Anxiety Score 08/17/2021 12/08/2020 12/09/2019 06/10/2019  Nervous, Anxious, on Edge 0 Control/stop worrying 0 1 1 0  Worry too much - different things 0 0 0 0  Trouble relaxing 0 0 1 0  Restless 0 0 0 0  Easily annoyed or irritable 0 0 0 0  Afraid - awful might happen 0 0 0 0  Total GAD 7 Score 0 2 3 1   Anxiety Difficulty Not difficult at all Not difficult at all Not difficult at all Not difficult at all   Fall Risk 06/09/2020 07/02/2020 02/26/2021 08/17/2021 08/17/2021  Falls in the past year? 0 0 - 0 0  Was there an injury with Fall? 0 - - 0 0  Fall Risk Category Calculator 0 - - 0 0   Fall Risk Category Low - - Low Low  Patient Fall Risk Level Low fall risk Low fall risk Low fall risk Low fall risk Low fall risk  Patient at Risk for Falls Due to No Fall Risks Medication side effect - History of fall(s) No Fall Risks  Fall risk Follow up Falls evaluation completed Falls evaluation completed;Education provided;Falls prevention discussed - Falls evaluation completed Falls evaluation completed    Functional Status Survey: Is the patient deaf or have difficulty hearing?: No Does the patient have difficulty seeing, even when wearing glasses/contacts?: No Does the patient have difficulty concentrating, remembering, or making decisions?: No Does the patient have difficulty walking or climbing stairs?: No Does the patient have difficulty dressing or bathing?: No Does the patient have difficulty doing errands alone such as visiting a doctor's office or shopping?: No   Past Medical History:  Past Medical History:  Diagnosis Date   Allergy    Anaphylaxis    Anxiety    Arthritis    Carpal tunnel syndrome    Depression    GERD (gastroesophageal reflux disease)    Hypertension    patient denies   Peripheral edema    Trigger thumb of left hand     Surgical History:  Past Surgical History:  Procedure Laterality Date   CARPAL TUNNEL RELEASE     HERNIA REPAIR  04/2009   ROTATOR CUFF REPAIR Left    TOTAL KNEE ARTHROPLASTY Left 03/27/2017   Procedure: TOTAL KNEE ARTHROPLASTY - LEFT;  Surgeon: 05/27/2017, MD;  Location: [facility] ORS;  Service: Orthopedics;  Laterality: Left;    Medications:  Current Outpatient Medications on File Prior to Visit  Medication Sig   alendronate (FOSAMAX) 70 MG tablet TAKE 1 TABLET BY MOUTH ONCE A WEEK WITH  A  FULL  GLASS  OF  WATER  ON  AN  EMPTY  STOMACH   aspirin 81 MG EC tablet Take 81 mg by mouth daily. Swallow whole.   Cholecalciferol (VITAMIN D3 PO) Take by mouth daily. 2000 IU (Kennedy Bucker)   omeprazole (PRILOSEC OTC) 20 MG tablet Take 1  tablet (20 mg total) by mouth daily.   UNABLE TO FIND in the morning and at bedtime. Med Name: alpha brain   No current facility-administered medications on file prior to visit.    Allergies:  Allergies  Allergen Reactions   Codeine Other (See Comments)    Sweating and passed out.   Chlorpheniramine-Phenylephrine Other (See Comments)    Skin reddened and hands peeled. She was told it was a chemical reaction between aspirin(gum) and Actifed.   Gabapentin Other (See Comments)    Constant stomach pains   Other Other (See Comments)    Macadamia nuts- lips go numb   Peppermint Oil Other (See Comments)    Difficulty breathing    Triprolidine-Pseudoephedrine Other (See  Comments)    Skin reddened and hands peeled. She was told it was a chemical reaction between aspirin(gum) and Actifed.    Social History:  Social History   Socioeconomic History   Marital status: Divorced    Spouse name: Not on file   Number of children: Not on file   Years of education: Not on file   Highest education level: High school graduate  Occupational History   Occupation: retired  Tobacco Use   Smoking status: Former    Types: Cigarettes    Quit date: 03/14/1997    Years since quitting: 24.4   Smokeless tobacco: Never  Vaping Use   Vaping Use: Never used  Substance and Sexual Activity   Alcohol use: No    Alcohol/week: 0.0 standard drinks   Drug use: No   Sexual activity: Not Currently  Other Topics Concern   Not on file  Social History Narrative   Not on file   Social Determinants of Health   Financial Resource Strain: Low Risk    Difficulty of Paying Living Expenses: Not hard at all  Food Insecurity: No Food Insecurity   Worried About Programme researcher, broadcasting/film/video in the Last Year: Never true   Ran Out of Food in the Last Year: Never true  Transportation Needs: No Transportation Needs   Lack of Transportation (Medical): No   Lack of Transportation (Non-Medical): No  Physical Activity: Inactive    Days of Exercise per Week: 0 days   Minutes of Exercise per Session: 0 min  Stress: No Stress Concern Present   Feeling of Stress : Not at all  Social Connections: Socially Isolated   Frequency of Communication with Friends and Family: More than three times a week   Frequency of Social Gatherings with Friends and Family: More than three times a week   Attends Religious Services: Never   Database administrator or Organizations: No   Attends Engineer, structural: Never   Marital Status: Divorced  Catering manager Violence: Not At Risk   Fear of Current or Ex-Partner: No   Emotionally Abused: No   Physically Abused: No   Sexually Abused: No   Social History   Tobacco Use  Smoking Status Former   Types: Cigarettes   Quit date: 03/14/1997   Years since quitting: 24.4  Smokeless Tobacco Never   Social History   Substance and Sexual Activity  Alcohol Use No   Alcohol/week: 0.0 standard drinks    Family History:  Family History  Problem Relation Age of Onset   Kidney disease Mother    Cancer Mother    Kidney disease Father    Cancer Father    Cancer Sister        bone   Hypertension Sister    Osteoporosis Sister    Breast cancer Sister    Other Son 52       brain tumor   Alzheimer's disease Sister    Hypertension Brother     Past medical history, surgical history, medications, allergies, family history and social history reviewed with patient today and changes made to appropriate areas of the chart.   Review of Systems - negative All other ROS negative except what is listed above and in the HPI.      Objective:    BP 117/79    Pulse 76    Temp 98.3 F (36.8 C)    Ht  (1.549 m)    Wt 147 lb (66.7 kg)  LMP 02/01/1991 (Approximate)    SpO2 97%    BMI 27.78 kg/m   Wt Readings from Last 3 Encounters:  08/17/21 147 lb (66.7 kg)  04/26/21 154 lb 3.2 oz (69.9 kg)  03/01/21 150 lb 12.8 oz (68.4 kg)    Physical Exam Constitutional:      General:  She is awake. She is not in acute distress.    Appearance: She is well-developed. She is not ill-appearing.  HENT:     Head: Normocephalic and atraumatic.     Right Ear: Hearing, tympanic membrane, ear canal and external ear normal. No drainage.     Left Ear: Hearing, tympanic membrane, ear canal and external ear normal. No drainage.     Nose: Nose normal.     Right Sinus: No maxillary sinus tenderness or frontal sinus tenderness.     Left Sinus: No maxillary sinus tenderness or frontal sinus tenderness.     Mouth/Throat:     Mouth: Mucous membranes are moist.     Pharynx: Oropharynx is clear. Uvula midline. No pharyngeal swelling, oropharyngeal exudate or posterior oropharyngeal erythema.  Eyes:     General: Lids are normal. No visual field deficit or scleral icterus.       Right eye: No discharge.        Left eye: No discharge.     Extraocular Movements: Extraocular movements intact.     Conjunctiva/sclera:     Right eye: Right conjunctiva is injected. No exudate.    Left eye: Left conjunctiva is not injected. No exudate.    Pupils: Pupils are equal, round, and reactive to light.     Visual Fields: Right eye visual fields normal and left eye visual fields normal.  Neck:     Thyroid: No thyromegaly.     Vascular: No carotid bruit.     Trachea: Trachea normal.  Cardiovascular:     Rate and Rhythm: Normal rate and regular rhythm.     Heart sounds: Normal heart sounds. No murmur heard.   No gallop.  Pulmonary:     Effort: Pulmonary effort is normal. No accessory muscle usage or respiratory distress.     Breath sounds: Normal breath sounds.  Chest:  Breasts:    Right: Normal.     Left: Normal.  Abdominal:     General: Bowel sounds are normal.     Palpations: Abdomen is soft. There is no hepatomegaly or splenomegaly.     Tenderness: There is no abdominal tenderness.  Musculoskeletal:        General: Normal range of motion.     Cervical back: Normal range of motion and neck  supple.     Thoracic back: Scoliosis present.     Lumbar back: Scoliosis present.     Right lower leg: No edema.     Left lower leg: No edema.     Comments: Kyphosis noted.  Lymphadenopathy:     Head:     Right side of head: No submental, submandibular, tonsillar, preauricular or posterior auricular adenopathy.     Left side of head: No submental, submandibular, tonsillar, preauricular or posterior auricular adenopathy.     Cervical: No cervical adenopathy.     Upper Body:     Right upper body: No supraclavicular, axillary or pectoral adenopathy.     Left upper body: No supraclavicular, axillary or pectoral adenopathy.  Skin:    General: Skin is warm and dry.     Capillary Refill: Capillary refill takes less than 2 seconds.  Findings: No rash.  Neurological:     Mental Status: She is alert and oriented to person, place, and time.     Gait: Gait is intact.     Deep Tendon Reflexes: Reflexes are normal and symmetric.     Reflex Scores:      Brachioradialis reflexes are 2+ on the right side and 2+ on the left side.      Patellar reflexes are 2+ on the right side and 2+ on the left side. Psychiatric:        Attention and Perception: Attention normal.        Mood and Affect: Mood normal.        Speech: Speech normal.        Behavior: Behavior normal. Behavior is cooperative.        Thought Content: Thought content normal.        Judgment: Judgment normal.    Results for orders placed or performed in visit on 08/17/21  Microalbumin, Urine Waived  Result Value Ref Range   Microalb, Ur Waived 30 (H) 0 - 19 mg/L   Creatinine, Urine Waived 200 10 - 300 mg/dL   Microalb/Creat Ratio <30 <30 mg/g      Assessment & Plan:   Problem List Items Addressed This Visit       Cardiovascular and Mediastinum   Hypertension    Chronic, diet-controlled, stable with BP at goal recent visits and at home.  Continue monitoring BP at home 3 mornings a week and documenting for provider.  Will  monitor and initiate medications if consistently elevated.  CMP, CBC, TSH, urine ALB (30 on check, mild protein) today.        Relevant Medications   atorvastatin (LIPITOR) 10 MG tablet   Other Relevant Orders   CBC with Differential/Platelet   Comprehensive metabolic panel   TSH   Microalbumin, Urine Waived (Completed)     Digestive   GERD (gastroesophageal reflux disease)    Chronic, stable with Prilosec.  Denies any symptoms at this time and no use of TUMS. Continue current medication regimen and adjust as needed.  Mag level annually.      Relevant Orders   Magnesium     Musculoskeletal and Integument   Kyphosis deformity of spine    Noted on exam, has underlying osteoporosis.  No pain.  At this time recommend low resistance exercises at home to maintain strength and prevent discomfort.  Consider PT is pain presents.      Osteoporosis    Noted on DEXA March 2021.  She is taking daily Calcium and Vit D3, to continue this.  Continue Fosamax, educated her at length on this.  Repeat DEXA in March 2023.  Monitor for fractures or falls.  Vit D level today.      Relevant Orders   VITAMIN D 25 Hydroxy (Vit-D Deficiency, Fractures)   DG Bone Density   Scoliosis of thoracic spine    With underlying osteoporosis, noted to thoracic and lumbar spine.  No pain.  At this time recommend low resistance exercises at home to maintain strength and prevent discomfort.  Consider PT is pain presents.        Other   Depression, major, single episode, moderate (HCC) - Primary    Chronic, stable with only occasional Buspar use, is doing well at this time without Trazodone. Denies SI/HI.  Continue Buspar as needed.  Adjust regimen as needed based on mood and scores.  Refills sent in.  Relevant Medications   busPIRone (BUSPAR) 5 MG tablet   Generalized anxiety disorder    Chronic, ongoing.  Continue current medication regimen, has benefit from Buspar as needed.   Adjust regimen as needed.   Refills sent.      Relevant Medications   busPIRone (BUSPAR) 5 MG tablet   Hypercholesteremia    Chronic, ongoing.  Continue current medication regimen and adjust as needed.  Lipid panel today.      Relevant Medications   atorvastatin (LIPITOR) 10 MG tablet   Other Relevant Orders   Comprehensive metabolic panel   Lipid Panel w/o Chol/HDL Ratio   Insomnia    Doing well without Trazodone at this time, restart as needed.  Due to age will try to keep medication regimen <11 medications.      Memory changes    MMSE 27/30 on most recent check, suspect concerns were more anxiety at time as was having difficulty in relationship with daughter.  Her son reports no concerns today.  Continue to monitor and restart memory aide if needed.      Other Visit Diagnoses     B12 deficiency       B12 level today -- history of lows.   Relevant Orders   Vitamin B12   Flu vaccine need       Flu vaccine today.   Relevant Orders   Flu Vaccine QUAD High Dose(Fluad) (Completed)   Encounter for annual physical exam       Annual exam today with labs.  Health maintenance reviewed with patient.        Follow up plan: Return in about 6 months (around 02/15/2022) for HLD/HTN, MOOD, OSTEOPOROSIS.   LABORATORY TESTING:  - Pap smear: not applicable  IMMUNIZATIONS:   - Tdap: Tetanus vaccination status reviewed: refused today - Influenza: Up to date - Pneumovax: Up to date - Prevnar: Up to date - HPV: Not applicable - Zostavax vaccine: Refused  SCREENING: -Mammogram: Up to date  - Colonoscopy: Refused  - Bone Density: Up to date  -Hearing Test: Not applicable  -Spirometry: Not applicable   PATIENT COUNSELING:   Advised to take 1 mg of folate supplement per day if capable of pregnancy.   Sexuality: Discussed sexually transmitted diseases, partner selection, use of condoms, avoidance of unintended pregnancy  and contraceptive alternatives.   Advised to avoid cigarette smoking.  I discussed  with the patient that most people either abstain from alcohol or drink within safe limits (<=14/week and <=4 drinks/occasion for males, <=7/weeks and <= 3 drinks/occasion for females) and that the risk for alcohol disorders and other health effects rises proportionally with the number of drinks per week and how often a drinker exceeds daily limits.  Discussed cessation/primary prevention of drug use and availability of treatment for abuse.   Diet: Encouraged to adjust caloric intake to maintain  or achieve ideal body weight, to reduce intake of dietary saturated fat and total fat, to limit sodium intake by avoiding high sodium foods and not adding table salt, and to maintain adequate dietary potassium and calcium preferably from fresh fruits, vegetables, and low-fat dairy products.    Stressed the importance of regular exercise  Injury prevention: Discussed safety belts, safety helmets, smoke detector, smoking near bedding or upholstery.   Dental health: Discussed importance of regular tooth brushing, flossing, and dental visits.    NEXT PREVENTATIVE PHYSICAL DUE IN 1 YEAR. Return in about 6 months (around 02/15/2022) for HLD/HTN, MOOD, OSTEOPOROSIS.

## 2021-08-17 NOTE — Assessment & Plan Note (Signed)
Noted on exam, has underlying osteoporosis.  No pain.  At this time recommend low resistance exercises at home to maintain strength and prevent discomfort.  Consider PT is pain presents.

## 2021-08-17 NOTE — Assessment & Plan Note (Signed)
Chronic, ongoing.  Continue current medication regimen, has benefit from Buspar as needed.   Adjust regimen as needed.  Refills sent. 

## 2021-08-18 LAB — TSH: TSH: 1.05 u[IU]/mL (ref 0.450–4.500)

## 2021-08-18 LAB — CBC WITH DIFFERENTIAL/PLATELET
Basophils Absolute: 0 10*3/uL (ref 0.0–0.2)
Basos: 1 %
EOS (ABSOLUTE): 0.2 10*3/uL (ref 0.0–0.4)
Eos: 3 %
Hematocrit: 37.4 % (ref 34.0–46.6)
Hemoglobin: 12.7 g/dL (ref 11.1–15.9)
Immature Grans (Abs): 0 10*3/uL (ref 0.0–0.1)
Immature Granulocytes: 0 %
Lymphocytes Absolute: 2.4 10*3/uL (ref 0.7–3.1)
Lymphs: 40 %
MCH: 32.8 pg (ref 26.6–33.0)
MCHC: 34 g/dL (ref 31.5–35.7)
MCV: 97 fL (ref 79–97)
Monocytes Absolute: 0.4 10*3/uL (ref 0.1–0.9)
Monocytes: 7 %
Neutrophils Absolute: 3 10*3/uL (ref 1.4–7.0)
Neutrophils: 49 %
Platelets: 346 10*3/uL (ref 150–450)
RBC: 3.87 x10E6/uL (ref 3.77–5.28)
RDW: 13.7 % (ref 11.7–15.4)
WBC: 6.1 10*3/uL (ref 3.4–10.8)

## 2021-08-18 LAB — COMPREHENSIVE METABOLIC PANEL
ALT: 18 IU/L (ref 0–32)
AST: 22 IU/L (ref 0–40)
Albumin/Globulin Ratio: 2 (ref 1.2–2.2)
Albumin: 4.3 g/dL (ref 3.7–4.7)
Alkaline Phosphatase: 45 IU/L (ref 44–121)
BUN/Creatinine Ratio: 25 (ref 12–28)
BUN: 16 mg/dL (ref 8–27)
Bilirubin Total: 0.8 mg/dL (ref 0.0–1.2)
CO2: 23 mmol/L (ref 20–29)
Calcium: 9 mg/dL (ref 8.7–10.3)
Chloride: 104 mmol/L (ref 96–106)
Creatinine, Ser: 0.65 mg/dL (ref 0.57–1.00)
Globulin, Total: 2.2 g/dL (ref 1.5–4.5)
Glucose: 80 mg/dL (ref 70–99)
Potassium: 3.9 mmol/L (ref 3.5–5.2)
Sodium: 141 mmol/L (ref 134–144)
Total Protein: 6.5 g/dL (ref 6.0–8.5)
eGFR: 93 mL/min/{1.73_m2} (ref >59)

## 2021-08-18 LAB — LIPID PANEL W/O CHOL/HDL RATIO
Cholesterol, Total: 161 mg/dL (ref 100–199)
HDL: 68 mg/dL (ref >39)
LDL Chol Calc (NIH): 82 mg/dL (ref 0–99)
Triglycerides: 54 mg/dL (ref 0–149)
VLDL Cholesterol Cal: 11 mg/dL (ref 5–40)

## 2021-08-18 LAB — VITAMIN D 25 HYDROXY (VIT D DEFICIENCY, FRACTURES): Vit D, 25-Hydroxy: 24.5 ng/mL — ABNORMAL LOW (ref 30.0–100.0)

## 2021-08-18 LAB — MAGNESIUM: Magnesium: 2.2 mg/dL (ref 1.6–2.3)

## 2021-08-18 LAB — VITAMIN B12: Vitamin B-12: 866 pg/mL (ref 232–1245)

## 2021-08-18 NOTE — Progress Notes (Signed)
Contacted via MyChart   Good morning Emily Watson, your labs have returned and are overall nice and normal with exception of Vitamin D being slightly low, please ensure you are taking Vitamin D3 2000 units daily for bone health.  Any questions? Keep being amazing!!  Thank you for allowing me to participate in your care.  I appreciate you. Kindest regards, Corrie Dandy'

## 2021-09-05 ENCOUNTER — Other Ambulatory Visit: Payer: Self-pay

## 2021-09-05 ENCOUNTER — Telehealth: Payer: Self-pay | Admitting: Nurse Practitioner

## 2021-09-05 MED ORDER — ALENDRONATE SODIUM 70 MG PO TABS: ORAL_TABLET | ORAL | 3 refills | Status: DC

## 2021-09-05 NOTE — Telephone Encounter (Signed)
Pt is calling to ask Jolene what Vitamins she can take  in addition to B-12, D-3? And can she take a multi vitamin? And how many units should she take?  Please advise  CB- [phone]

## 2021-09-05 NOTE — Telephone Encounter (Signed)
Routing to provider to advise patient.

## 2021-09-05 NOTE — Telephone Encounter (Signed)
Left message for patient to give our office a call back to discuss Jolene's recommendations with patient. Advised patient to give our office a call back. See previous note if patient calls back.

## 2021-09-09 NOTE — Telephone Encounter (Signed)
Attempted to contact patient, no answer LVM.  °

## 2021-09-21 ENCOUNTER — Telehealth: Payer: Self-pay | Admitting: Nurse Practitioner

## 2021-09-21 ENCOUNTER — Other Ambulatory Visit: Payer: Self-pay | Admitting: Nurse Practitioner

## 2021-09-21 DIAGNOSIS — Z1231 Encounter for screening mammogram for malignant neoplasm of breast: Secondary | ICD-10-CM

## 2021-09-21 NOTE — Telephone Encounter (Signed)
Copied from CRM [id number]. Topic: General - Other >> Sep 21, 2021  9:12 AM Emily Watson A wrote: Reason for CRM: The patient has called to request resubmitted orders for a bone density scan   The scan is due by 11/17/21  Please contact further when possible

## 2021-09-21 NOTE — Telephone Encounter (Signed)
Attempted to contact patient no answer LVM with providers response.

## 2021-10-05 ENCOUNTER — Other Ambulatory Visit: Payer: Self-pay | Admitting: Nurse Practitioner

## 2021-10-05 NOTE — Telephone Encounter (Signed)
Pt has refills remaining. 09/05/21 #4/3  Requested Prescriptions  Pending Prescriptions Disp Refills   alendronate (FOSAMAX) 70 MG tablet [Pharmacy Med Name: Alendronate Sodium 70 MG Oral Tablet] 12 tablet 0    Sig: TAKE 1 TABLET BY MOUTH ONCE A WEEK WITH A FULL GLASS OF WATER ON AN EMPTY STOMACH     Endocrinology:  Bisphosphonates Failed - 10/05/2021  9:22 AM      Failed - Vitamin D in normal range and within 360 days    Vit D, 25-Hydroxy  Date Value Ref Range Status  08/17/2021 24.5 (L) 30.0 - 100.0 ng/mL Final    Comment:    Vitamin D deficiency has been defined by the Institute of Medicine and an Endocrine Society practice guideline as a level of serum 25-OH vitamin D less than 20 ng/mL (1,2). The Endocrine Society went on to further define vitamin D insufficiency as a level between 21 and 29 ng/mL (2). 1. IOM (Institute of Medicine). 2010. Dietary reference    intakes for calcium and D. [place]: The    [employer]. 2. Holick MF, Binkley [place], Bischoff-Ferrari HA, et al.    Evaluation, treatment, and prevention of vitamin D    deficiency: an Endocrine Society clinical practice    guideline. JCEM. 2011 Jul; 96(7):1911-30.          Failed - Phosphate in normal range and within 360 days    No results found for: PHOS       Passed - Ca in normal range and within 360 days    Calcium  Date Value Ref Range Status  08/17/2021 9.0 8.7 - 10.3 mg/dL Final         Passed - Cr in normal range and within 360 days    Creatinine, Ser  Date Value Ref Range Status  08/17/2021 0.65 0.57 - 1.00 mg/dL Final         Passed - Mg Level in normal range and within 360 days    Magnesium  Date Value Ref Range Status  08/17/2021 2.2 1.6 - 2.3 mg/dL Final         Passed - eGFR is 30 or above and within 360 days    GFR calc Af Amer  Date Value Ref Range Status  06/09/2020 104 >59 mL/min/1.73 Final    Comment:    **In accordance with recommendations from the NKF-ASN Task  force,**   [facility] is in the process of updating its eGFR calculation to the   2021 CKD-EPI creatinine equation that estimates kidney function   without a race variable.    GFR calc non Af Amer  Date Value Ref Range Status  06/09/2020 90 >59 mL/min/1.73 Final   eGFR  Date Value Ref Range Status  08/17/2021 93 >59 mL/min/1.73 Final         Passed - Valid encounter within last 12 months    Recent Outpatient Visits          1 month ago Depression, major, single episode, moderate ([place])   [place], Jolene T, NP   5 months ago Depression, major, single episode, moderate ([place])   [place], Jolene T, NP   7 months ago Depression, major, single episode, moderate ([place])   [place], Jolene T, NP   9 months ago Acute pain of left shoulder   [place], Jolene T, NP   10 months ago Depression, major, single episode, moderate ([place])   [facility] [place], Barbaraann Faster, NP      Future Appointments            In 4 months Cann[place], Barbaraann Faster, NP [facility]           Passed - Bone Mineral Density or Dexa Scan completed in the last 2 years

## 2021-10-06 ENCOUNTER — Other Ambulatory Visit: Payer: Self-pay | Admitting: Nurse Practitioner

## 2021-10-06 NOTE — Telephone Encounter (Signed)
Pt's granddaughter called to request this refill for today, please advise

## 2021-10-07 NOTE — Telephone Encounter (Signed)
[facility] called and spoke to Mickel Baas [facility] about the refill(s) Fosamax requested. Advised it was sent to [facility] ([address]) on 09/05/21 #4/3 refill(s). I asked if they can retrieve it to refill. She says yes she can pull it over and fill it. Will refuse this request.  Requested Prescriptions  Pending Prescriptions Disp Refills   alendronate (FOSAMAX) 70 MG tablet [Pharmacy Med Name: Alendronate Sodium 70 MG Oral Tablet] 12 tablet 0    Sig: TAKE 1 TABLET BY MOUTH ONCE A WEEK WITH A FULL GLASS OF WATER ON AN EMPTY STOMACH     Endocrinology:  Bisphosphonates Failed - 10/06/2021  4:31 PM      Failed - Vitamin D in normal range and within 360 days    Vit D, 25-Hydroxy  Date Value Ref Range Status  08/17/2021 24.5 (L) 30.0 - 100.0 ng/mL Final    Comment:    Vitamin D deficiency has been defined by the Institute of Medicine and an Endocrine Society practice guideline as a level of serum 25-OH vitamin D less than 20 ng/mL (1,2). The Endocrine Society went on to further define vitamin D insufficiency as a level between 21 and 29 ng/mL (2). 1. IOM (Institute of Medicine). 2010. Dietary reference    intakes for calcium and D. [place]: The    [employer]. 2. Holick MF, Binkley [place], Bischoff-Ferrari HA, et al.    Evaluation, treatment, and prevention of vitamin D    deficiency: an Endocrine Society clinical practice    guideline. JCEM. 2011 Jul; 96(7):1911-30.           Failed - Phosphate in normal range and within 360 days    No results found for: PHOS        Passed - Ca in normal range and within 360 days    Calcium  Date Value Ref Range Status  08/17/2021 9.0 8.7 - 10.3 mg/dL Final          Passed - Cr in normal range and within 360 days    Creatinine, Ser  Date Value Ref Range Status  08/17/2021 0.65 0.57 - 1.00 mg/dL Final          Passed - Mg Level in normal range and within 360 days    Magnesium  Date Value Ref Range Status   08/17/2021 2.2 1.6 - 2.3 mg/dL Final          Passed - eGFR is 30 or above and within 360 days    GFR calc Af Amer  Date Value Ref Range Status  06/09/2020 104 >59 mL/min/1.73 Final    Comment:    **In accordance with recommendations from the NKF-ASN Task force,**   [facility] is in the process of updating its eGFR calculation to the   2021 CKD-EPI creatinine equation that estimates kidney function   without a race variable.    GFR calc non Af Amer  Date Value Ref Range Status  06/09/2020 90 >59 mL/min/1.73 Final   eGFR  Date Value Ref Range Status  08/17/2021 93 >59 mL/min/1.73 Final          Passed - Valid encounter within last 12 months    Recent Outpatient Visits           1 month ago Depression, major, single episode, moderate ([place])   [place], Jolene T, NP   5 months ago Depression, major, single episode, moderate ([place])   [place], Barbaraann Faster, NP  7 months ago Depression, major, single episode, moderate ([place])   [place], [place] T, NP   9 months ago Acute pain of left shoulder   [place], [place] T, NP   10 months ago Depression, major, single episode, moderate ([place])   [place], Barbaraann Faster, NP       Future Appointments             In 4 months Cannady, Barbaraann Faster, NP [facility]            Passed - Bone Mineral Density or Dexa Scan completed in the last 2 years

## 2021-10-15 IMAGING — MG MM DIGITAL SCREENING BILAT W/ TOMO AND CAD
8 series · 8 of 24 positions shown · non-contrast
Comparison: Previous exam(s).

CLINICAL DATA: Screening.

EXAM:
DIGITAL SCREENING BILATERAL MAMMOGRAM WITH TOMOSYNTHESIS AND CAD
TECHNIQUE: Bilateral screening digital craniocaudal and mediolateral oblique
mammograms were obtained. Bilateral screening digital breast
tomosynthesis was performed. The images were evaluated with
computer-aided detection.

[L CC synth-2D]
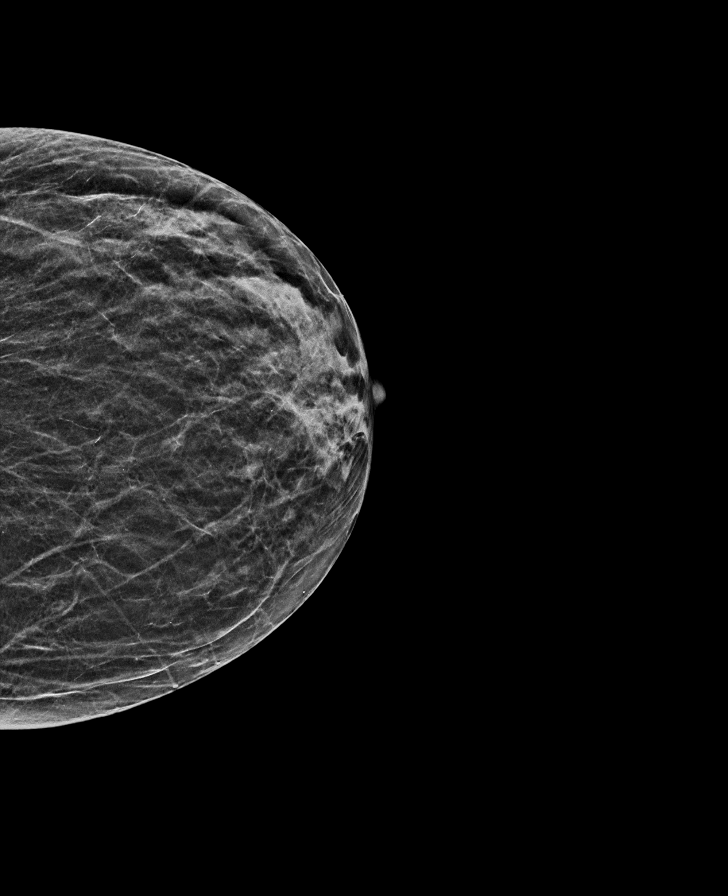

[R CC synth-2D]
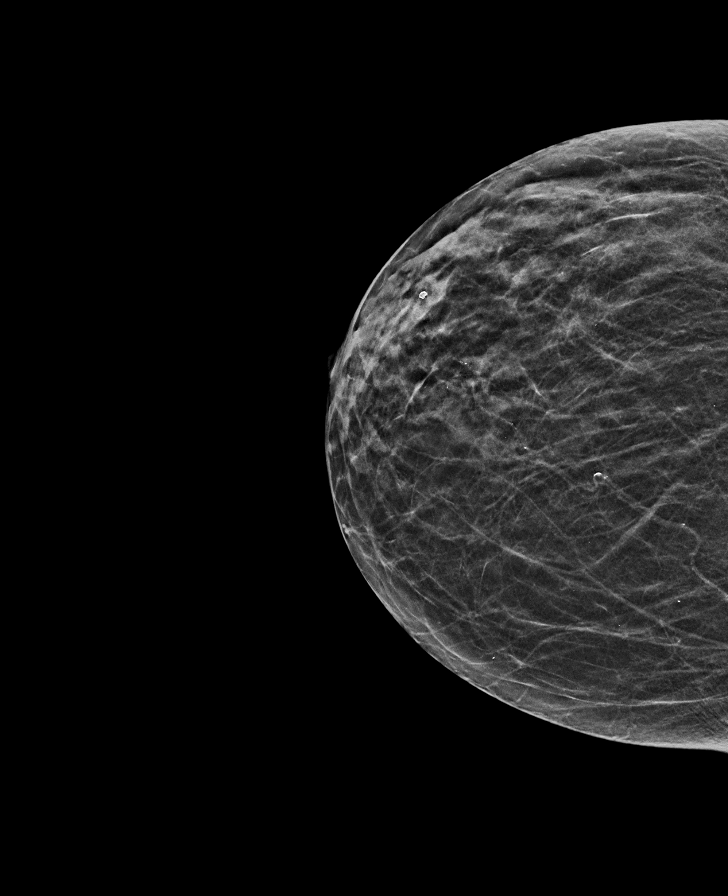

[R MLO synth-2D]
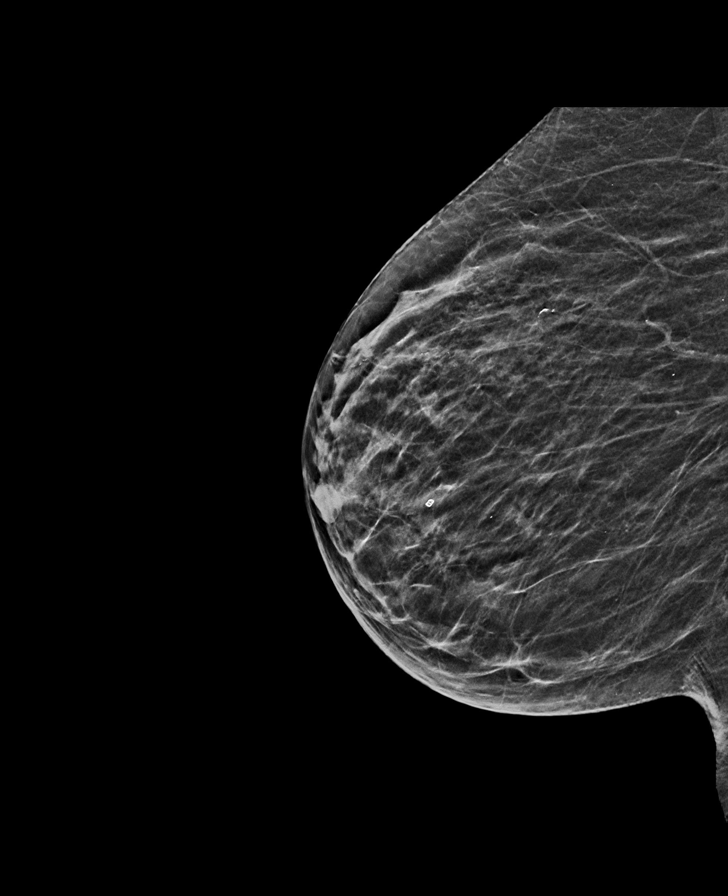

[L MLO synth-2D]
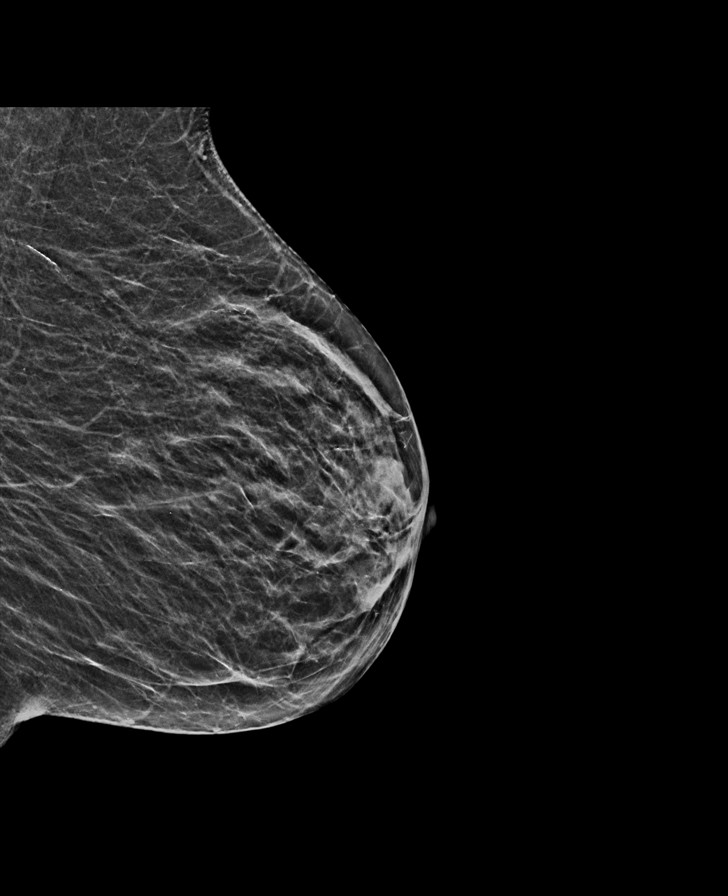

[L CC tomo · tomo slice 21/40.0]
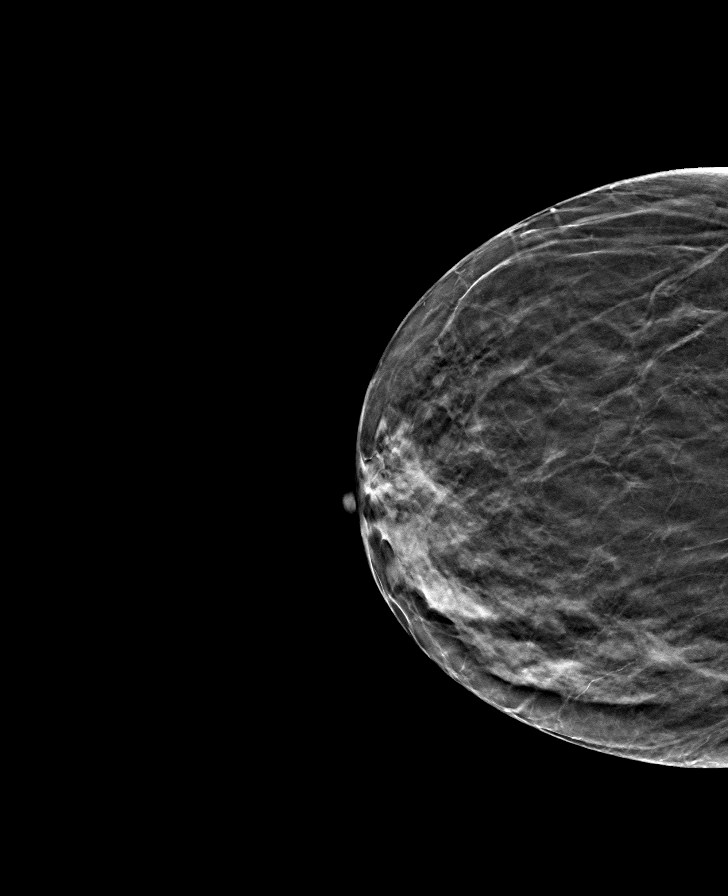

[R CC tomo · tomo slice 21/42.0]
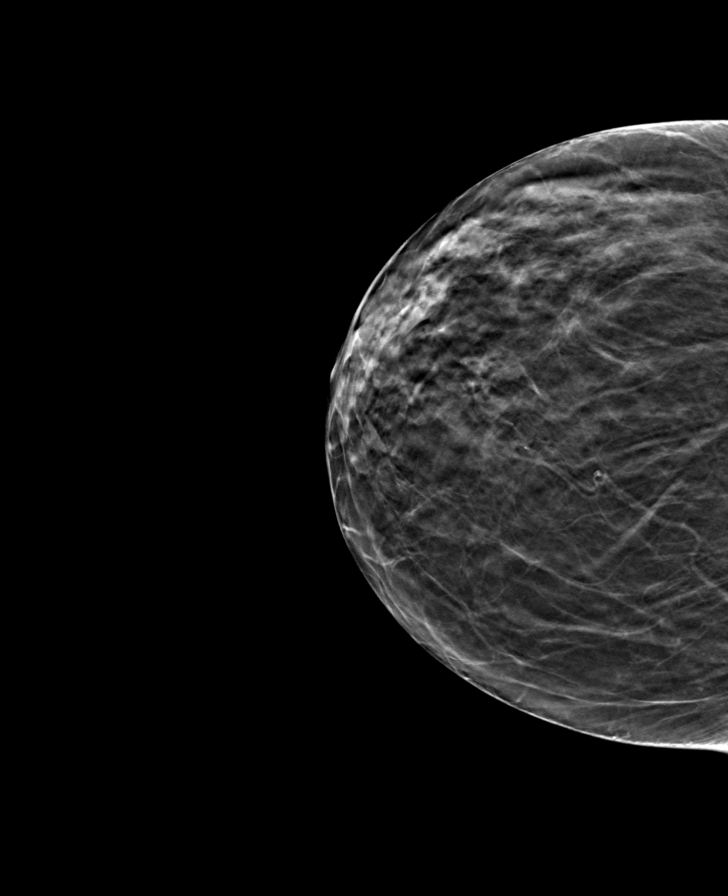

[R MLO tomo · tomo slice 21/42.0]
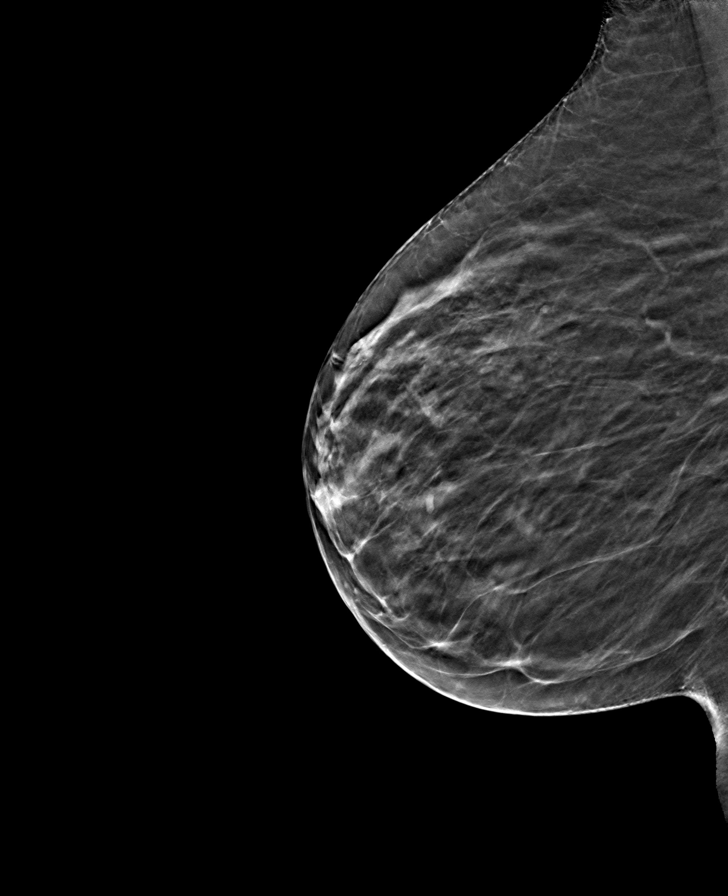

[L MLO tomo · tomo slice 22/43.0]
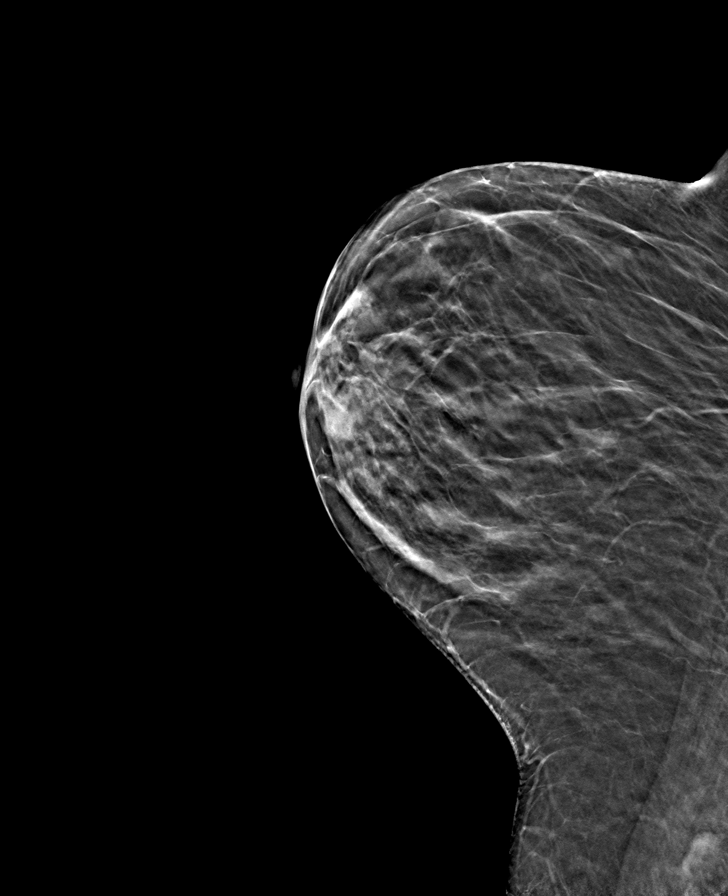

[8 of 24 positions shown; findings below may reference images not displayed]

ACR Breast Density Category b: There are scattered areas of
fibroglandular density.
FINDINGS: There are no findings suspicious for malignancy. The images were
evaluated with computer-aided detection.
IMPRESSION: No mammographic evidence of malignancy. A result letter of this
screening mammogram will be mailed directly to the patient.

RECOMMENDATION:
Screening mammogram in one year. (Code:[id number])

BI-RADS CATEGORY  1: Negative.

## 2021-10-17 ENCOUNTER — Encounter: Payer: Self-pay | Admitting: Nurse Practitioner

## 2021-10-17 DIAGNOSIS — M810 Age-related osteoporosis without current pathological fracture: Secondary | ICD-10-CM

## 2021-10-17 DIAGNOSIS — Z1231 Encounter for screening mammogram for malignant neoplasm of breast: Secondary | ICD-10-CM

## 2021-12-12 ENCOUNTER — Ambulatory Visit
Admission: RE | Admit: 2021-12-12 | Discharge: 2021-12-12 | Disposition: A | Discharge status: Home / Self Care | Payer: Medicare Other | Source: Ambulatory Visit | Attending: Nurse Practitioner | Admitting: Nurse Practitioner

## 2021-12-12 DIAGNOSIS — M810 Age-related osteoporosis without current pathological fracture: Secondary | ICD-10-CM | POA: Diagnosis not present

## 2021-12-12 DIAGNOSIS — Z1231 Encounter for screening mammogram for malignant neoplasm of breast: Secondary | ICD-10-CM | POA: Diagnosis not present

## 2021-12-12 NOTE — Progress Notes (Signed)
Contacted via MyChart ? ? ?Good morning Emily Watson, your bone density has returned.  Continues to show some osteoporosis, but score has improved a little with Fosamax on board.  Continue this medication at this time and will repeat DEXA in 2 years.  Any questions? ?Keep being amazing!!  Thank you for allowing me to participate in your care.  I appreciate you. ?Kindest regards, ?[place] ?

## 2021-12-13 NOTE — Progress Notes (Signed)
Contacted via MyChart   Normal mammogram, may repeat in one year:)

## 2021-12-28 DIAGNOSIS — H25013 Cortical age-related cataract, bilateral: Secondary | ICD-10-CM | POA: Diagnosis not present

## 2021-12-28 DIAGNOSIS — H524 Presbyopia: Secondary | ICD-10-CM | POA: Diagnosis not present

## 2021-12-28 DIAGNOSIS — H52213 Irregular astigmatism, bilateral: Secondary | ICD-10-CM | POA: Diagnosis not present

## 2021-12-28 DIAGNOSIS — H35363 Drusen (degenerative) of macula, bilateral: Secondary | ICD-10-CM | POA: Diagnosis not present

## 2021-12-28 DIAGNOSIS — H2512 Age-related nuclear cataract, left eye: Secondary | ICD-10-CM | POA: Diagnosis not present

## 2021-12-28 DIAGNOSIS — H2513 Age-related nuclear cataract, bilateral: Secondary | ICD-10-CM | POA: Diagnosis not present

## 2022-02-14 DIAGNOSIS — H43811 Vitreous degeneration, right eye: Secondary | ICD-10-CM | POA: Diagnosis not present

## 2022-02-15 ENCOUNTER — Ambulatory Visit: Payer: Medicare HMO | Admitting: Nurse Practitioner

## 2022-02-17 ENCOUNTER — Encounter: Payer: Self-pay | Admitting: Nurse Practitioner

## 2022-02-17 ENCOUNTER — Ambulatory Visit (INDEPENDENT_AMBULATORY_CARE_PROVIDER_SITE_OTHER): Payer: Medicare HMO | Admitting: Nurse Practitioner

## 2022-02-17 VITALS — BP 122/65 | HR 54 | Temp 97.9°F | Ht 61.0 in | Wt 145.0 lb

## 2022-02-17 DIAGNOSIS — F321 Major depressive disorder, single episode, moderate: Secondary | ICD-10-CM

## 2022-02-17 DIAGNOSIS — E7800 Pure hypercholesterolemia, unspecified: Secondary | ICD-10-CM | POA: Diagnosis not present

## 2022-02-17 DIAGNOSIS — F411 Generalized anxiety disorder: Secondary | ICD-10-CM

## 2022-02-17 DIAGNOSIS — R413 Other amnesia: Secondary | ICD-10-CM | POA: Diagnosis not present

## 2022-02-17 DIAGNOSIS — M810 Age-related osteoporosis without current pathological fracture: Secondary | ICD-10-CM

## 2022-02-17 DIAGNOSIS — I10 Essential (primary) hypertension: Secondary | ICD-10-CM | POA: Diagnosis not present

## 2022-02-17 NOTE — Assessment & Plan Note (Signed)
MMSE 27/30 on most recent check, repeat at physical and if any changes consider imaging. Her son reported no concerns at recent visit.  Continue to monitor and restart memory aide if needed.

## 2022-02-17 NOTE — Assessment & Plan Note (Addendum)
Chronic, ongoing.  Continue current medication regimen, has benefit from Buspar as needed.   Adjust regimen as needed.  Refills up to date.

## 2022-02-17 NOTE — Assessment & Plan Note (Addendum)
Noted on DEXA March 2021 with mild improvement recent repeat April 2023.  She is taking daily Calcium and Vit D3, to continue this.  Continue Fosamax (consider holiday in 2026), educated her at length on this.  Repeat DEXA in April 2025.  Monitor for fractures or falls.  Vit D level today.

## 2022-02-17 NOTE — Assessment & Plan Note (Signed)
Chronic, ongoing.  Continue current medication regimen and adjust as needed. Lipid panel today. 

## 2022-02-17 NOTE — Progress Notes (Signed)
 BP 122/65   Pulse (!) 54   Temp 97.9 F (36.6 C) (Oral)   Ht 5' 1" (1.549 m)   Wt 145 lb (65.8 kg)   LMP 02/01/1991 (Approximate)   SpO2 98%   BMI 27.40 kg/m    Subjective:    Patient ID: Emily Watson, female    DOB: 05/14/1946, 75 y.o.   MRN: [id number]  HPI: Emily Watson is a 75 y.o. female  Chief Complaint  Patient presents with   Hyperlipidemia   Hypertension   Mood   Osteoporosis   HYPERTENSION / HYPERLIPIDEMIA No current blood pressure medications.  Continues on Atorvastatin 10 MG daily.  Was to recently have cataract surgery, but got very anxious and had to leave before surgery. Satisfied with current treatment? yes Duration of hypertension: chronic BP monitoring frequency: a couple times a week BP range: 120/70-80 range at home Duration of hyperlipidemia: chronic Cholesterol medication side effects: no Cholesterol supplements: none Medication compliance: good compliance Aspirin: yes Recent stressors: no Recurrent headaches: no Visual changes: no Palpitations: no Dyspnea: no Chest pain: no Lower extremity edema: no Dizzy/lightheaded: no    OSTEOPOROSIS DEXA  last March 2021 -- did note osteoporosis with T Score -3.0, repeat April 2023 showed some improvement at -2.8. Continues Fosamax (started March 2021) and has tolerated. She is taking daily supplements, Vit D and Calcium. Satisfied with current treatment?: yes Medication side effects: no Medication compliance: good compliance Past osteoporosis medications/treatments:  Adequate calcium & vitamin D: yes Intolerance to bisphosphonates:no Weight bearing exercises: yes   DEPRESSION/ANXIETY Takes Buspar only as needed. Mood status: stable Satisfied with current treatment?: yes Symptom severity: mild  Duration of current treatment : chronic Side effects: no Medication compliance: good compliance Psychotherapy/counseling: none Depressed mood: occasional Anxious mood: occasional Anhedonia:  no Significant weight loss or gain: no Insomnia: none  Fatigue: no Feelings of worthlessness or guilt: no Impaired concentration/indecisiveness: no Suicidal ideations: no Hopelessness: no Crying spells: no    02/17/2022    2:52 PM 08/17/2021    3:31 PM 07/04/2021    9:25 AM 04/26/2021    4:27 PM 03/01/2021    3:54 PM  Depression screen PHQ 2/9  Decreased Interest 0 0 0 0   Down, Depressed, Hopeless 0 1 0 0 0  PHQ - 2 Score 0 1 0 0 0  Altered sleeping 0 0  0 0  Tired, decreased energy 0 0  0 0  Change in appetite 0 0  0 0  Feeling bad or failure about yourself  0 0  0 0  Trouble concentrating 0 0  0 0  Moving slowly or fidgety/restless 0 0  0 0  Suicidal thoughts 0 0  0 0  PHQ-9 Score 0 1  0 0  Difficult doing work/chores Not difficult at all Not difficult at all  Not difficult at all Not difficult at all       02/17/2022    2:52 PM 08/17/2021    3:32 PM 12/08/2020    8:53 AM 12/09/2019    9:59 AM  GAD 7 : Generalized Anxiety Score  Nervous, Anxious, on Edge 0 0 1 1  Control/stop worrying 0 0 1 1  Worry too much - different things 0 0 0 0  Trouble relaxing 0 0 0 1  Restless 0 0 0 0  Easily annoyed or irritable 0 0 0 0  Afraid - awful might happen 0 0 0 0  Total GAD 7 Score 0 0 2 3    Anxiety Difficulty Not difficult at all Not difficult at all Not difficult at all Not difficult at all   Relevant past medical, surgical, family and social history reviewed and updated as indicated. Interim medical history since our last visit reviewed. Allergies and medications reviewed and updated.  Review of Systems  Constitutional:  Negative for activity change, appetite change, diaphoresis, fatigue and fever.  Respiratory:  Negative for cough, chest tightness and shortness of breath.   Cardiovascular:  Negative for chest pain, palpitations and leg swelling.  Gastrointestinal: Negative.   Neurological: Negative.   Psychiatric/Behavioral:  Negative for decreased concentration,  self-injury, sleep disturbance and suicidal ideas. The patient is nervous/anxious.    Per HPI unless specifically indicated above     Objective:    BP 122/65   Pulse (!) 54   Temp 97.9 F (36.6 C) (Oral)   Ht 5' 1" (1.549 m)   Wt 145 lb (65.8 kg)   LMP 02/01/1991 (Approximate)   SpO2 98%   BMI 27.40 kg/m   Wt Readings from Last 3 Encounters:  02/17/22 145 lb (65.8 kg)  08/17/21 147 lb (66.7 kg)  04/26/21 154 lb 3.2 oz (69.9 kg)    Physical Exam Vitals and nursing note reviewed.  Constitutional:      General: She is awake. She is not in acute distress.    Appearance: She is well-developed. She is obese. She is not ill-appearing.  HENT:     Head: Normocephalic.     Right Ear: Hearing normal.     Left Ear: Hearing normal.  Eyes:     General: Lids are normal.        Right eye: No discharge.        Left eye: No discharge.     Conjunctiva/sclera: Conjunctivae normal.     Pupils: Pupils are equal, round, and reactive to light.  Neck:     Thyroid: No thyromegaly.     Vascular: No carotid bruit.  Cardiovascular:     Rate and Rhythm: Normal rate and regular rhythm.     Heart sounds: Normal heart sounds. No murmur heard.    No gallop.  Pulmonary:     Effort: Pulmonary effort is normal. No accessory muscle usage or respiratory distress.     Breath sounds: Normal breath sounds.  Abdominal:     General: Bowel sounds are normal.     Palpations: Abdomen is soft.  Musculoskeletal:     Cervical back: Normal range of motion and neck supple.     Right lower leg: No edema.     Left lower leg: No edema.  Skin:    General: Skin is warm and dry.  Neurological:     Mental Status: She is alert.     Deep Tendon Reflexes: Reflexes are normal and symmetric.     Reflex Scores:      Brachioradialis reflexes are 2+ on the right side and 2+ on the left side.      Patellar reflexes are 2+ on the right side and 2+ on the left side.    Comments: Reports year as 2020 (is 2023) and month as  June (correct).  Able to report location and day of week.    Psychiatric:        Attention and Perception: Attention normal.        Mood and Affect: Mood normal.        Speech: Speech normal.        Behavior: Behavior normal. Behavior is cooperative.          Thought Content: Thought content normal.    Results for orders placed or performed in visit on 08/17/21  CBC with Differential/Platelet  Result Value Ref Range   WBC 6.1 3.4 - 10.8 x10E3/uL   RBC 3.87 3.77 - 5.28 x10E6/uL   Hemoglobin 12.7 11.1 - 15.9 g/dL   Hematocrit 37.4 34.0 - 46.6 %   MCV 97 79 - 97 fL   MCH 32.8 26.6 - 33.0 pg   MCHC 34.0 31.5 - 35.7 g/dL   RDW 13.7 11.7 - 15.4 %   Platelets 346 150 - 450 x10E3/uL   Neutrophils 49 Not Estab. %   Lymphs 40 Not Estab. %   Monocytes 7 Not Estab. %   Eos 3 Not Estab. %   Basos 1 Not Estab. %   Neutrophils Absolute 3.0 1.4 - 7.0 x10E3/uL   Lymphocytes Absolute 2.4 0.7 - 3.1 x10E3/uL   Monocytes Absolute 0.4 0.1 - 0.9 x10E3/uL   EOS (ABSOLUTE) 0.2 0.0 - 0.4 x10E3/uL   Basophils Absolute 0.0 0.0 - 0.2 x10E3/uL   Immature Granulocytes 0 Not Estab. %   Immature Grans (Abs) 0.0 0.0 - 0.1 x10E3/uL  Comprehensive metabolic panel  Result Value Ref Range   Glucose 80 70 - 99 mg/dL   BUN 16 8 - 27 mg/dL   Creatinine, Ser 0.65 0.57 - 1.00 mg/dL   eGFR 93 >59 mL/min/1.73   BUN/Creatinine Ratio 25 12 - 28   Sodium 141 134 - 144 mmol/L   Potassium 3.9 3.5 - 5.2 mmol/L   Chloride 104 96 - 106 mmol/L   CO2 23 20 - 29 mmol/L   Calcium 9.0 8.7 - 10.3 mg/dL   Total Protein 6.5 6.0 - 8.5 g/dL   Albumin 4.3 3.7 - 4.7 g/dL   Globulin, Total 2.2 1.5 - 4.5 g/dL   Albumin/Globulin Ratio 2.0 1.2 - 2.2   Bilirubin Total 0.8 0.0 - 1.2 mg/dL   Alkaline Phosphatase 45 44 - 121 IU/L   AST 22 0 - 40 IU/L   ALT 18 0 - 32 IU/L  Lipid Panel w/o Chol/HDL Ratio  Result Value Ref Range   Cholesterol, Total 161 100 - 199 mg/dL   Triglycerides 54 0 - 149 mg/dL   HDL 68 >39 mg/dL   VLDL  Cholesterol Cal 11 5 - 40 mg/dL   LDL Chol Calc (NIH) 82 0 - 99 mg/dL  TSH  Result Value Ref Range   TSH 1.050 0.450 - 4.500 uIU/mL  VITAMIN D 25 Hydroxy (Vit-D Deficiency, Fractures)  Result Value Ref Range   Vit D, 25-Hydroxy 24.5 (L) 30.0 - 100.0 ng/mL  Magnesium  Result Value Ref Range   Magnesium 2.2 1.6 - 2.3 mg/dL  Vitamin B12  Result Value Ref Range   Vitamin B-12 866 232 - 1,245 pg/mL  Microalbumin, Urine Waived  Result Value Ref Range   Microalb, Ur Waived 30 (H) 0 - 19 mg/L   Creatinine, Urine Waived 200 10 - 300 mg/dL   Microalb/Creat Ratio <30 <30 mg/g      Assessment & Plan:   Problem List Items Addressed This Visit       Cardiovascular and Mediastinum   Hypertension    Chronic, diet-controlled, stable with BP at goal.  Continue monitoring BP at home 3 mornings a week and documenting for provider.  Will monitor and initiate medications if consistently elevated.  LABS: CMP and TSH today.  Focus on DASH diet.      Relevant Orders   Comprehensive metabolic   panel   TSH     Musculoskeletal and Integument   Osteoporosis    Noted on DEXA March 2021 with mild improvement recent repeat April 2023.  She is taking daily Calcium and Vit D3, to continue this.  Continue Fosamax (consider holiday in 2026), educated her at length on this.  Repeat DEXA in April 2025.  Monitor for fractures or falls.  Vit D level today.      Relevant Orders   VITAMIN D 25 Hydroxy (Vit-D Deficiency, Fractures)     Other   Depression, major, single episode, moderate (HCC) - Primary    Chronic, stable with only occasional Buspar use, is doing well at this time without Trazodone. Denies SI/HI.  Continue Buspar as needed.  Adjust regimen as needed based on mood and scores.  Refills sent up to date.      Generalized anxiety disorder    Chronic, ongoing.  Continue current medication regimen, has benefit from Buspar as needed.   Adjust regimen as needed.  Refills up to date.       Hypercholesteremia    Chronic, ongoing.  Continue current medication regimen and adjust as needed.  Lipid panel today.      Relevant Orders   Comprehensive metabolic panel   Lipid Panel w/o Chol/HDL Ratio   Memory changes    MMSE 27/30 on most recent check, repeat at physical and if any changes consider imaging. Her son reported no concerns at recent visit.  Continue to monitor and restart memory aide if needed.        Follow up plan: Return in about 6 months (around 08/19/2022) for Annual physical -- after 08/17/22.

## 2022-02-17 NOTE — Assessment & Plan Note (Signed)
Chronic, stable with only occasional Buspar use, is doing well at this time without Trazodone. Denies SI/HI.  Continue Buspar as needed.  Adjust regimen as needed based on mood and scores.  Refills sent up to date.

## 2022-02-17 NOTE — Patient Instructions (Signed)

## 2022-02-17 NOTE — Assessment & Plan Note (Signed)
Chronic, diet-controlled, stable with BP at goal.  Continue monitoring BP at home 3 mornings a week and documenting for provider.  Will monitor and initiate medications if consistently elevated.  LABS: CMP and TSH today.  Focus on DASH diet.

## 2022-02-18 LAB — LIPID PANEL W/O CHOL/HDL RATIO
Cholesterol, Total: 163 mg/dL (ref 100–199)
HDL: 64 mg/dL (ref >39)
LDL Chol Calc (NIH): 88 mg/dL (ref 0–99)
Triglycerides: 55 mg/dL (ref 0–149)
VLDL Cholesterol Cal: 11 mg/dL (ref 5–40)

## 2022-02-18 LAB — COMPREHENSIVE METABOLIC PANEL
ALT: 12 IU/L (ref 0–32)
AST: 22 IU/L (ref 0–40)
Albumin/Globulin Ratio: 1.8 (ref 1.2–2.2)
Albumin: 4.4 g/dL (ref 3.7–4.7)
Alkaline Phosphatase: 44 IU/L (ref 44–121)
BUN/Creatinine Ratio: 22 (ref 12–28)
BUN: 15 mg/dL (ref 8–27)
Bilirubin Total: 0.7 mg/dL (ref 0.0–1.2)
CO2: 26 mmol/L (ref 20–29)
Calcium: 9.2 mg/dL (ref 8.7–10.3)
Chloride: 103 mmol/L (ref 96–106)
Creatinine, Ser: 0.67 mg/dL (ref 0.57–1.00)
Globulin, Total: 2.5 g/dL (ref 1.5–4.5)
Glucose: 90 mg/dL (ref 70–99)
Potassium: 4.5 mmol/L (ref 3.5–5.2)
Sodium: 142 mmol/L (ref 134–144)
Total Protein: 6.9 g/dL (ref 6.0–8.5)
eGFR: 92 mL/min/{1.73_m2} (ref >59)

## 2022-02-18 LAB — TSH: TSH: 1.38 u[IU]/mL (ref 0.450–4.500)

## 2022-02-18 LAB — VITAMIN D 25 HYDROXY (VIT D DEFICIENCY, FRACTURES): Vit D, 25-Hydroxy: 38.4 ng/mL (ref 30.0–100.0)

## 2022-02-19 NOTE — Progress Notes (Signed)
Contacted via MyChart   Good morning Gayathri, your labs have returned and everything remains stable.  No medication changes needed.  Any questions? Keep being amazing!!  Thank you for allowing me to participate in your care.  I appreciate you. Kindest regards, Shantel Wesely

## 2022-03-07 DIAGNOSIS — H2512 Age-related nuclear cataract, left eye: Secondary | ICD-10-CM | POA: Diagnosis not present

## 2022-03-07 DIAGNOSIS — H25812 Combined forms of age-related cataract, left eye: Secondary | ICD-10-CM | POA: Diagnosis not present

## 2022-03-15 DIAGNOSIS — H2512 Age-related nuclear cataract, left eye: Secondary | ICD-10-CM | POA: Diagnosis not present

## 2022-04-19 DIAGNOSIS — H25011 Cortical age-related cataract, right eye: Secondary | ICD-10-CM | POA: Diagnosis not present

## 2022-04-19 DIAGNOSIS — H2511 Age-related nuclear cataract, right eye: Secondary | ICD-10-CM | POA: Diagnosis not present

## 2022-04-28 ENCOUNTER — Other Ambulatory Visit: Payer: Self-pay | Admitting: Nurse Practitioner

## 2022-05-01 NOTE — Telephone Encounter (Signed)
Future appt in 3 months . Requested Prescriptions  Pending Prescriptions Disp Refills  . alendronate (FOSAMAX) 70 MG tablet [Pharmacy Med Name: Alendronate Sodium 70 MG Oral Tablet] 12 tablet 0    Sig: TAKE 1 TABLET BY MOUTH EVERY 7 DAYS.  TAKE WITH A FULL GLASS OF WATER ON AN EMPTY STOMACH     Endocrinology:  Bisphosphonates Failed - 04/28/2022  1:49 PM      Failed - Phosphate in normal range and within 360 days    No results found for: "PHOS"       Passed - Ca in normal range and within 360 days    Calcium  Date Value Ref Range Status  02/17/2022 9.2 8.7 - 10.3 mg/dL Final         Passed - Vitamin D in normal range and within 360 days    Vit D, 25-Hydroxy  Date Value Ref Range Status  02/17/2022 38.4 30.0 - 100.0 ng/mL Final    Comment:    Vitamin D deficiency has been defined by the Institute of Medicine and an Endocrine Society practice guideline as a level of serum 25-OH vitamin D less than 20 ng/mL (1,2). The Endocrine Society went on to further define vitamin D insufficiency as a level between 21 and 29 ng/mL (2). 1. IOM (Institute of Medicine). 2010. Dietary reference    intakes for calcium and D. [place]: The    [employer]. 2. Holick MF, Binkley [place], Bischoff-Ferrari HA, et al.    Evaluation, treatment, and prevention of vitamin D    deficiency: an Endocrine Society clinical practice    guideline. JCEM. 2011 Jul; 96(7):1911-30.          Passed - Cr in normal range and within 360 days    Creatinine, Ser  Date Value Ref Range Status  02/17/2022 0.67 0.57 - 1.00 mg/dL Final         Passed - Mg Level in normal range and within 360 days    Magnesium  Date Value Ref Range Status  08/17/2021 2.2 1.6 - 2.3 mg/dL Final         Passed - eGFR is 30 or above and within 360 days    GFR calc Af Amer  Date Value Ref Range Status  06/09/2020 104 >59 mL/min/1.73 Final    Comment:    **In accordance with recommendations from the NKF-ASN Task force,**    [facility] is in the process of updating its eGFR calculation to the   2021 CKD-EPI creatinine equation that estimates kidney function   without a race variable.    GFR calc non Af Amer  Date Value Ref Range Status  06/09/2020 90 >59 mL/min/1.73 Final   eGFR  Date Value Ref Range Status  02/17/2022 92 >59 mL/min/1.73 Final         Passed - Valid encounter within last 12 months    Recent Outpatient Visits          2 months ago Depression, major, single episode, moderate ([place])   [place], Jolene T, NP   8 months ago Depression, major, single episode, moderate ([place])   [place], [place] T, NP   1 year ago Depression, major, single episode, moderate ([place])   [place], [place] T, NP   1 year ago Depression, major, single episode, moderate ([place])   [place], [place] T, NP   1 year ago Acute pain of left shoulder   [facility] [place], Barbaraann Faster, NP      Future Appointments            In 3 months Cannady, Barbaraann Faster, NP [facility]           Passed - Bone Mineral Density or Dexa Scan completed in the last 2 years

## 2022-05-09 DIAGNOSIS — H25811 Combined forms of age-related cataract, right eye: Secondary | ICD-10-CM | POA: Diagnosis not present

## 2022-05-09 DIAGNOSIS — H25011 Cortical age-related cataract, right eye: Secondary | ICD-10-CM | POA: Diagnosis not present

## 2022-05-09 DIAGNOSIS — H2511 Age-related nuclear cataract, right eye: Secondary | ICD-10-CM | POA: Diagnosis not present

## 2022-05-16 DIAGNOSIS — H2511 Age-related nuclear cataract, right eye: Secondary | ICD-10-CM | POA: Diagnosis not present

## 2022-05-18 ENCOUNTER — Other Ambulatory Visit: Payer: Self-pay | Admitting: Nurse Practitioner

## 2022-05-18 NOTE — Telephone Encounter (Signed)
Refilled 05/01/2022 #12 0 rf. Requested Prescriptions  Pending Prescriptions Disp Refills  . alendronate (FOSAMAX) 70 MG tablet [Pharmacy Med Name: Alendronate Sodium 70 MG Oral Tablet] 8 tablet 0    Sig: TAKE 1 TABLET BY MOUTH ONCE A WEEK WITH  A  FULL  GLASS  OF  WATER  ON  AN  EMPTY  STOMACH     Endocrinology:  Bisphosphonates Failed - 05/18/2022  5:35 PM      Failed - Phosphate in normal range and within 360 days    No results found for: "PHOS"       Passed - Ca in normal range and within 360 days    Calcium  Date Value Ref Range Status  02/17/2022 9.2 8.7 - 10.3 mg/dL Final         Passed - Vitamin D in normal range and within 360 days    Vit D, 25-Hydroxy  Date Value Ref Range Status  02/17/2022 38.4 30.0 - 100.0 ng/mL Final    Comment:    Vitamin D deficiency has been defined by the Institute of Medicine and an Endocrine Society practice guideline as a level of serum 25-OH vitamin D less than 20 ng/mL (1,2). The Endocrine Society went on to further define vitamin D insufficiency as a level between 21 and 29 ng/mL (2). 1. IOM (Institute of Medicine). 2010. Dietary reference    intakes for calcium and D. [place]: The    [employer]. 2. Holick MF, Binkley [place], Bischoff-Ferrari HA, et al.    Evaluation, treatment, and prevention of vitamin D    deficiency: an Endocrine Society clinical practice    guideline. JCEM. 2011 Jul; 96(7):1911-30.          Passed - Cr in normal range and within 360 days    Creatinine, Ser  Date Value Ref Range Status  02/17/2022 0.67 0.57 - 1.00 mg/dL Final         Passed - Mg Level in normal range and within 360 days    Magnesium  Date Value Ref Range Status  08/17/2021 2.2 1.6 - 2.3 mg/dL Final         Passed - eGFR is 30 or above and within 360 days    GFR calc Af Amer  Date Value Ref Range Status  06/09/2020 104 >59 mL/min/1.73 Final    Comment:    **In accordance with recommendations from the NKF-ASN Task  force,**   [facility] is in the process of updating its eGFR calculation to the   2021 CKD-EPI creatinine equation that estimates kidney function   without a race variable.    GFR calc non Af Amer  Date Value Ref Range Status  06/09/2020 90 >59 mL/min/1.73 Final   eGFR  Date Value Ref Range Status  02/17/2022 92 >59 mL/min/1.73 Final         Passed - Valid encounter within last 12 months    Recent Outpatient Visits          3 months ago Depression, major, single episode, moderate ([place])   [place], Jolene T, NP   9 months ago Depression, major, single episode, moderate ([place])   [place], [place] T, NP   1 year ago Depression, major, single episode, moderate ([place])   [place], Iantha T, NP   1 year ago Depression, major, single episode, moderate ([place])   [place], [place] T, NP   1 year ago Acute pain  of left shoulder   [facility] [place], Barbaraann Faster, NP      Future Appointments            In 3 months Cannady, Barbaraann Faster, NP [facility], [facility]           Passed - Bone Mineral Density or Dexa Scan completed in the last 2 years

## 2022-05-24 ENCOUNTER — Telehealth: Payer: Self-pay | Admitting: Nurse Practitioner

## 2022-05-24 ENCOUNTER — Other Ambulatory Visit: Payer: Self-pay | Admitting: Nurse Practitioner

## 2022-05-24 MED ORDER — ALENDRONATE SODIUM 70 MG PO TABS: ORAL_TABLET | ORAL | 4 refills | Status: DC

## 2022-05-24 NOTE — Telephone Encounter (Signed)
Copied from [place] [phone]. Topic: General - Other >> May 24, 2022 11:37 AM Everette C wrote: The patient has been directed by their pharmacy to contact their PCP and follow up on their request for alendronate (FOSAMAX) 70 MG tablet [[id number]]   Please contact the patient further when possible

## 2022-05-24 NOTE — Telephone Encounter (Signed)
Attempted to call patient- left message Rx was sent to [facility] 05/01/22 #12- it can be transferred if needed- just contact pharmacy Duplicate request- Rx 8/56/31 #12 Requested Prescriptions  Pending Prescriptions Disp Refills  . alendronate (FOSAMAX) 70 MG tablet [Pharmacy Med Name: Alendronate Sodium 70 MG Oral Tablet] 8 tablet 0    Sig: TAKE 1 TABLET BY MOUTH ONCE A WEEK WITH  A  FULL  GLASS  OF  WATER  ON  AN  EMPTY  STOMACH     Endocrinology:  Bisphosphonates Failed - 05/24/2022 11:32 AM      Failed - Phosphate in normal range and within 360 days    No results found for: "PHOS"       Passed - Ca in normal range and within 360 days    Calcium  Date Value Ref Range Status  02/17/2022 9.2 8.7 - 10.3 mg/dL Final         Passed - Vitamin D in normal range and within 360 days    Vit D, 25-Hydroxy  Date Value Ref Range Status  02/17/2022 38.4 30.0 - 100.0 ng/mL Final    Comment:    Vitamin D deficiency has been defined by the Institute of Medicine and an Endocrine Society practice guideline as a level of serum 25-OH vitamin D less than 20 ng/mL (1,2). The Endocrine Society went on to further define vitamin D insufficiency as a level between 21 and 29 ng/mL (2). 1. IOM (Institute of Medicine). 2010. Dietary reference    intakes for calcium and D. [place]: The    [employer]. 2. Holick MF, Binkley [place], Bischoff-Ferrari HA, et al.    Evaluation, treatment, and prevention of vitamin D    deficiency: an Endocrine Society clinical practice    guideline. JCEM. 2011 Jul; 96(7):1911-30.          Passed - Cr in normal range and within 360 days    Creatinine, Ser  Date Value Ref Range Status  02/17/2022 0.67 0.57 - 1.00 mg/dL Final         Passed - Mg Level in normal range and within 360 days    Magnesium  Date Value Ref Range Status  08/17/2021 2.2 1.6 - 2.3 mg/dL Final         Passed - eGFR is 30 or above and within 360 days    GFR calc Af  Amer  Date Value Ref Range Status  06/09/2020 104 >59 mL/min/1.73 Final    Comment:    **In accordance with recommendations from the NKF-ASN Task force,**   [facility] is in the process of updating its eGFR calculation to the   2021 CKD-EPI creatinine equation that estimates kidney function   without a race variable.    GFR calc non Af Amer  Date Value Ref Range Status  06/09/2020 90 >59 mL/min/1.73 Final   eGFR  Date Value Ref Range Status  02/17/2022 92 >59 mL/min/1.73 Final         Passed - Valid encounter within last 12 months    Recent Outpatient Visits          3 months ago Depression, major, single episode, moderate ([place])   [place], Jolene T, NP   9 months ago Depression, major, single episode, moderate ([place])   [place], [place] T, NP   1 year ago Depression, major, single episode, moderate ([place])   [place], Barbaraann Faster, NP   1 year  ago Depression, major, single episode, moderate ([place])   [place], [place] T, NP   1 year ago Acute pain of left shoulder   [place], Barbaraann Faster, NP      Future Appointments            In 3 months Cannady, Barbaraann Faster, NP [facility], [facility]           Passed - Bone Mineral Density or Dexa Scan completed in the last 2 years

## 2022-05-25 ENCOUNTER — Other Ambulatory Visit: Payer: Self-pay | Admitting: Nurse Practitioner

## 2022-05-25 NOTE — Telephone Encounter (Signed)
Unable to refill per protocol, last refill by provider 05/24/22 for 12 and 4 RF. Request too soon. Will refuse.  Requested Prescriptions  Pending Prescriptions Disp Refills  . alendronate (FOSAMAX) 70 MG tablet [Pharmacy Med Name: Alendronate Sodium 70 MG Oral Tablet] 8 tablet 0    Sig: TAKE 1 TABLET BY MOUTH ONCE A WEEK WITH  A  FULL  GLASS  OF  WATER  ON  AN  EMPTY  STOMACH     Endocrinology:  Bisphosphonates Failed - 05/25/2022 10:18 AM      Failed - Phosphate in normal range and within 360 days    No results found for: "PHOS"       Passed - Ca in normal range and within 360 days    Calcium  Date Value Ref Range Status  02/17/2022 9.2 8.7 - 10.3 mg/dL Final         Passed - Vitamin D in normal range and within 360 days    Vit D, 25-Hydroxy  Date Value Ref Range Status  02/17/2022 38.4 30.0 - 100.0 ng/mL Final    Comment:    Vitamin D deficiency has been defined by the Institute of Medicine and an Endocrine Society practice guideline as a level of serum 25-OH vitamin D less than 20 ng/mL (1,2). The Endocrine Society went on to further define vitamin D insufficiency as a level between 21 and 29 ng/mL (2). 1. IOM (Institute of Medicine). 2010. Dietary reference    intakes for calcium and D. [place]: The    [employer]. 2. Holick MF, Binkley [place], Bischoff-Ferrari HA, et al.    Evaluation, treatment, and prevention of vitamin D    deficiency: an Endocrine Society clinical practice    guideline. JCEM. 2011 Jul; 96(7):1911-30.          Passed - Cr in normal range and within 360 days    Creatinine, Ser  Date Value Ref Range Status  02/17/2022 0.67 0.57 - 1.00 mg/dL Final         Passed - Mg Level in normal range and within 360 days    Magnesium  Date Value Ref Range Status  08/17/2021 2.2 1.6 - 2.3 mg/dL Final         Passed - eGFR is 30 or above and within 360 days    GFR calc Af Amer  Date Value Ref Range Status  06/09/2020 104 >59 mL/min/1.73 Final     Comment:    **In accordance with recommendations from the NKF-ASN Task force,**   [facility] is in the process of updating its eGFR calculation to the   2021 CKD-EPI creatinine equation that estimates kidney function   without a race variable.    GFR calc non Af Amer  Date Value Ref Range Status  06/09/2020 90 >59 mL/min/1.73 Final   eGFR  Date Value Ref Range Status  02/17/2022 92 >59 mL/min/1.73 Final         Passed - Valid encounter within last 12 months    Recent Outpatient Visits          3 months ago Depression, major, single episode, moderate ([place])   [place], Jolene T, NP   9 months ago Depression, major, single episode, moderate ([place])   [place], [place] T, NP   1 year ago Depression, major, single episode, moderate ([place])   [place], [place] T, NP   1 year ago Depression, major, single episode, moderate ([place])  [place], Henrine [place] T, NP   1 year ago Acute pain of left shoulder   [place], Barbaraann Faster, NP      Future Appointments            In 2 months Cannady, Barbaraann Faster, NP [facility], [facility]           Passed - Bone Mineral Density or Dexa Scan completed in the last 2 years

## 2022-05-26 ENCOUNTER — Other Ambulatory Visit: Payer: Self-pay

## 2022-05-26 NOTE — Telephone Encounter (Signed)
Entered in error. Rx request was sent yesterday.

## 2022-05-29 ENCOUNTER — Ambulatory Visit: Payer: Self-pay | Admitting: *Deleted

## 2022-05-29 NOTE — Telephone Encounter (Signed)
Pt told that there is no known reaction with  Magnesium Glycinate and her meds but I will forward to Marnee Guarneri, NP and she also could ask her pharmacist.  Reason for Disposition  [1] Follow-up call to recent contact AND [2] information only call, no triage required  Answer Assessment - Initial Assessment Questions 1. REASON FOR CALL or QUESTION: "What is your reason for calling today?" or "How can I best help you?" or "What question do you have that I can help answer?"     Pt questioning if she can take magnesium supplement with her other meds.  Protocols used: Information Only Call - No Triage-A-AH

## 2022-05-29 NOTE — Telephone Encounter (Signed)
No answer, voicemail left with call back number.

## 2022-05-30 NOTE — Telephone Encounter (Signed)
Left message for patient to notify of Jolene's recommendations. Advised patient to give our office a call if she has any questions.   OK for [facility] to give note if patient calls back.

## 2022-05-31 NOTE — Telephone Encounter (Signed)
Spoke with patient and notified her of Jolene's recommendations. Patient verbalized understanding and has no further questions at this time.

## 2022-06-14 DIAGNOSIS — Z0100 Encounter for examination of eyes and vision without abnormal findings: Secondary | ICD-10-CM | POA: Diagnosis not present

## 2022-06-28 ENCOUNTER — Telehealth: Payer: Self-pay | Admitting: Nurse Practitioner

## 2022-06-28 NOTE — Telephone Encounter (Signed)
N/A unable to leave a message for patient to call back and schedule the Medicare Annual Wellness Visit (AWV) virtually or by telephone.  Last AWV 07/04/21  Please schedule at anytime with [facility]-Nurse Health Advisor.    Any questions, please call me at [phone] 

## 2022-08-20 NOTE — Patient Instructions (Signed)

## 2022-08-22 ENCOUNTER — Encounter: Payer: Self-pay | Admitting: Nurse Practitioner

## 2022-08-22 ENCOUNTER — Ambulatory Visit (INDEPENDENT_AMBULATORY_CARE_PROVIDER_SITE_OTHER): Payer: Medicare HMO | Admitting: Nurse Practitioner

## 2022-08-22 VITALS — BP 132/71 | HR 54 | Temp 97.9°F | Ht 60.98 in | Wt 144.0 lb

## 2022-08-22 DIAGNOSIS — Z23 Encounter for immunization: Secondary | ICD-10-CM

## 2022-08-22 DIAGNOSIS — K219 Gastro-esophageal reflux disease without esophagitis: Secondary | ICD-10-CM

## 2022-08-22 DIAGNOSIS — E7800 Pure hypercholesterolemia, unspecified: Secondary | ICD-10-CM | POA: Diagnosis not present

## 2022-08-22 DIAGNOSIS — M810 Age-related osteoporosis without current pathological fracture: Secondary | ICD-10-CM

## 2022-08-22 DIAGNOSIS — Z87891 Personal history of nicotine dependence: Secondary | ICD-10-CM

## 2022-08-22 DIAGNOSIS — F5101 Primary insomnia: Secondary | ICD-10-CM | POA: Diagnosis not present

## 2022-08-22 DIAGNOSIS — Z0000 Encounter for general adult medical examination without abnormal findings: Secondary | ICD-10-CM | POA: Diagnosis not present

## 2022-08-22 DIAGNOSIS — I10 Essential (primary) hypertension: Secondary | ICD-10-CM | POA: Diagnosis not present

## 2022-08-22 DIAGNOSIS — F411 Generalized anxiety disorder: Secondary | ICD-10-CM

## 2022-08-22 DIAGNOSIS — R413 Other amnesia: Secondary | ICD-10-CM | POA: Diagnosis not present

## 2022-08-22 DIAGNOSIS — F321 Major depressive disorder, single episode, moderate: Secondary | ICD-10-CM

## 2022-08-22 MED ORDER — ALPRAZOLAM 0.5 MG PO TABS: ORAL_TABLET | ORAL | 0 refills | Status: DC

## 2022-08-22 MED ORDER — ATORVASTATIN CALCIUM 10 MG PO TABS: ORAL_TABLET | ORAL | 4 refills | Status: DC

## 2022-08-22 MED ORDER — BUSPIRONE HCL 5 MG PO TABS: 5.0000 mg | ORAL_TABLET | Freq: Two times a day (BID) | ORAL | 4 refills | Status: DC | PRN

## 2022-08-22 NOTE — Progress Notes (Signed)
BP 132/71   Pulse (!) 54   Temp 97.9 F (36.6 C) (Oral)   Ht 5' 0.98" (1.549 m)   Wt 144 lb (65.3 kg)   LMP 02/01/1991 (Approximate)   SpO2 97%   BMI 27.22 kg/m    Subjective:    Patient ID: Emily Watson, female    DOB: 09/24/1945, 77 y.o.   MRN: [id number]  HPI: Emily Watson is a 77 y.o. female presenting on 08/22/2022 for comprehensive medical examination and Medicare Wellness. Current medical complaints include:none  She currently lives with: self Menopausal Symptoms: no  Daughter-in-law present at bedside.  HYPERTENSION / HYPERLIPIDEMIA Continues on Lipitor for cholesterol, is tolerating without ADR. No current BP medications.  Has been focused on diet and overall health.  Continues to take daily Omeprazole for GERD with no issues. Satisfied with current treatment? yes Duration of hypertension: chronic BP monitoring frequency: a couple times a week BP range: 120/80 range at home Duration of hyperlipidemia: chronic Cholesterol medication side effects: no Cholesterol supplements: none Medication compliance: good compliance Aspirin: yes Recent stressors: no Recurrent headaches: no Visual changes: no Palpitations: no Dyspnea: no Chest pain: no Lower extremity edema: no Dizzy/lightheaded: no    OSTEOPOROSIS Last DEXA scan was March 2021 -- did note osteoporosis with T Score -3.0 and repeat in April 2023 noted T-score -2.8. Taking Fosamax weekly and supplements, Vit D and Calcium. Satisfied with current treatment?: yes Medication side effects: no Medication compliance: good compliance Past osteoporosis medications/treatments:  Adequate calcium & vitamin D: yes Intolerance to bisphosphonates:no Weight bearing exercises: yes  DEPRESSION/ANXIETY Has Buspar to take as needed -- she takes two times a day.  Took Trazodone in past.  Recently lost dog.  Memory changes continue to be a concern, daughter-in-law does notice some changes and more anxiety recently.   Patient reports only notices memory changes when anxious. Mood status: stable Satisfied with current treatment?: yes Symptom severity: mild  Duration of current treatment : chronic Side effects: no Medication compliance: good compliance Psychotherapy/counseling: none Depressed mood: occasional Anxious mood: occasional Anhedonia: no Significant weight loss or gain: no Insomnia: none  Fatigue: no Feelings of worthlessness or guilt: no Impaired concentration/indecisiveness: no Suicidal ideations: no Hopelessness: no Crying spells: no    08/22/2022    3:12 PM 02/17/2022    2:52 PM 08/17/2021    3:31 PM 07/04/2021    9:25 AM 04/26/2021    4:27 PM  Depression screen PHQ 2/9  Decreased Interest 1 0 0 0 0  Down, Depressed, Hopeless 2 0 1 0 0  PHQ - 2 Score 3 0 1 0 0  Altered sleeping 0 0 0  0  Tired, decreased energy 0 0 0  0  Change in appetite 0 0 0  0  Feeling bad or failure about yourself  0 0 0  0  Trouble concentrating 1 0 0  0  Moving slowly or fidgety/restless 1 0 0  0  Suicidal thoughts 0 0 0  0  PHQ-9 Score 5 0 1  0  Difficult doing work/chores Somewhat difficult Not difficult at all Not difficult at all  Not difficult at all      08/22/2022    3:12 PM 02/17/2022    2:52 PM 08/17/2021    3:32 PM 12/08/2020    8:53 AM  GAD 7 : Generalized Anxiety Score  Nervous, Anxious, on Edge 2 0 0 1  Control/stop worrying 1 0 0 1  Worry too much - different things 2  0 0 0  Trouble relaxing 0 0 0 0  Restless 0 0 0 0  Easily annoyed or irritable 2 0 0 0  Afraid - awful might happen 0 0 0 0  Total GAD 7 Score 7 0 0 2  Anxiety Difficulty Somewhat difficult Not difficult at all Not difficult at all Not difficult at all      08/17/2021    3:03 PM 08/17/2021    3:37 PM 02/17/2022    2:51 PM 08/22/2022    2:46 PM 08/22/2022    3:22 PM  Fall Risk  Falls in the past year? 0 0 0 0 0  Was there an injury with Fall? 0 0 0 0 0  Fall Risk Category Calculator 0 0 0 0 0  Fall Risk Category  _0   Patient Fall Risk Level Low fall risk Low fall risk Low fall risk  Low fall risk  Patient at Risk for Falls Due to History of fall(s) No Fall Risks No Fall Risks No Fall Risks No Fall Risks  Fall risk Follow up Falls evaluation completed Falls evaluation completed Falls evaluation completed Falls evaluation completed Falls prevention discussed    Functional Status Survey: Is the patient deaf or have difficulty hearing?: No Does the patient have difficulty seeing, even when wearing glasses/contacts?: No Does the patient have difficulty concentrating, remembering, or making decisions?: No Does the patient have difficulty walking or climbing stairs?: No Does the patient have difficulty dressing or bathing?: No Does the patient have difficulty doing errands alone such as visiting a doctor's office or shopping?: No   Past Medical History:  Past Medical History:  Diagnosis Date   Allergy    Anaphylaxis    Anxiety    Arthritis    Carpal tunnel syndrome    Depression    GERD (gastroesophageal reflux disease)    Hypertension    patient denies   Peripheral edema    Trigger thumb of left hand     Surgical History:  Past Surgical History:  Procedure Laterality Date   CARPAL TUNNEL RELEASE     HERNIA REPAIR  04/2009   ROTATOR CUFF REPAIR Left    TOTAL KNEE ARTHROPLASTY Left 03/27/2017   Procedure: TOTAL KNEE ARTHROPLASTY - LEFT;  Surgeon: Hessie Knows, MD;  Location: [facility] ORS;  Service: Orthopedics;  Laterality: Left;    Medications:  Current Outpatient Medications on File Prior to Visit  Medication Sig   alendronate (FOSAMAX) 70 MG tablet TAKE 1 TABLET BY MOUTH EVERY 7 DAYS.  TAKE WITH A FULL GLASS OF WATER ON AN EMPTY STOMACH   aspirin 81 MG EC tablet Take 81 mg by mouth daily. Swallow whole.   Cholecalciferol (VITAMIN D3 PO) Take by mouth daily. 2000 IU (51mg)   MAGNESIUM GLYCINATE PO Take 500 mg by mouth daily.   omeprazole (PRILOSEC OTC) 20 MG tablet Take 1  tablet (20 mg total) by mouth daily.   UNABLE TO FIND in the morning and at bedtime. Med Name: alpha brain   No current facility-administered medications on file prior to visit.    Allergies:  Allergies  Allergen Reactions   Codeine Other (See Comments)    Sweating and passed out.   Chlorpheniramine-Phenylephrine Other (See Comments)    Skin reddened and hands peeled. She was told it was a chemical reaction between aspirin(gum) and Actifed.   Gabapentin Other (See Comments)    Constant stomach pains   Other Other (See Comments)  Macadamia nuts- lips go numb   Peppermint Oil Other (See Comments)    Difficulty breathing    Triprolidine-Pseudoephedrine Other (See Comments)    Skin reddened and hands peeled. She was told it was a chemical reaction between aspirin(gum) and Actifed.    Social History:  Social History   Socioeconomic History   Marital status: Divorced    Spouse name: Not on file   Number of children: Not on file   Years of education: Not on file   Highest education level: High school graduate  Occupational History   Occupation: retired  Tobacco Use   Smoking status: Former    Types: Cigarettes    Quit date: 03/14/1997    Years since quitting: 25.4   Smokeless tobacco: Never  Vaping Use   Vaping Use: Never used  Substance and Sexual Activity   Alcohol use: No    Alcohol/week: 0.0 standard drinks of alcohol   Drug use: No   Sexual activity: Not Currently  Other Topics Concern   Not on file  Social History Narrative   Not on file   Social Determinants of Health   Financial Resource Strain: Low Risk  (07/04/2021)   Overall Financial Resource Strain (CARDIA)    Difficulty of Paying Living Expenses: Not hard at all  Food Insecurity: No [place] (07/04/2021)   Hunger Vital Sign    Worried About Running Out of Food in the Last Year: Never true    [place] in the Last Year: Never true  Transportation Needs: No Transportation Needs  (07/04/2021)   PRAPARE - Hydrologist (Medical): No    Lack of Transportation (Non-Medical): No  Physical Activity: Inactive (07/04/2021)   Exercise Vital Sign    Days of Exercise per Week: 0 days    Minutes of Exercise per Session: 0 min  Stress: No Stress Concern Present (07/04/2021)   [place]    Feeling of Stress : Not at all  Social Connections: Socially Isolated (07/04/2021)   Social Connection and Isolation Panel [NHANES]    Frequency of Communication with Friends and Family: More than three times a week    Frequency of Social Gatherings with Friends and Family: More than three times a week    Attends Religious Services: Never    Marine scientist or Organizations: No    Attends Archivist Meetings: Never    Marital Status: Divorced  Human resources officer Violence: Not At Risk (07/04/2021)   Humiliation, Afraid, Rape, and Kick questionnaire    Fear of Current or Ex-Partner: No    Emotionally Abused: No    Physically Abused: No    Sexually Abused: No   Social History   Tobacco Use  Smoking Status Former   Types: Cigarettes   Quit date: 03/14/1997   Years since quitting: 25.4  Smokeless Tobacco Never   Social History   Substance and Sexual Activity  Alcohol Use No   Alcohol/week: 0.0 standard drinks of alcohol    Family History:  Family History  Problem Relation Age of Onset   Kidney disease Mother    Cancer Mother    Kidney disease Father    Cancer Father    Cancer Sister        bone   Hypertension Sister    Osteoporosis Sister    Breast cancer Sister    Other Son 27       brain  tumor   Alzheimer's disease Sister    Hypertension Brother     Past medical history, surgical history, medications, allergies, family history and social history reviewed with patient today and changes made to appropriate areas of the chart.   Review of Systems -  negative All other ROS negative except what is listed above and in the HPI.      Objective:    BP 132/71   Pulse (!) 54   Temp 97.9 F (36.6 C) (Oral)   Ht 5' 0.98" (1.549 m)   Wt 144 lb (65.3 kg)   LMP 02/01/1991 (Approximate)   SpO2 97%   BMI 27.22 kg/m   Wt Readings from Last 3 Encounters:  08/22/22 144 lb (65.3 kg)  02/17/22 145 lb (65.8 kg)  08/17/21 147 lb (66.7 kg)    Physical Exam Constitutional:      General: She is awake. She is not in acute distress.    Appearance: She is well-developed. She is not ill-appearing.  HENT:     Head: Normocephalic and atraumatic.     Right Ear: Hearing, tympanic membrane, ear canal and external ear normal. No drainage.     Left Ear: Hearing, tympanic membrane, ear canal and external ear normal. No drainage.     Nose: Nose normal.     Right Sinus: No maxillary sinus tenderness or frontal sinus tenderness.     Left Sinus: No maxillary sinus tenderness or frontal sinus tenderness.     Mouth/Throat:     Mouth: Mucous membranes are moist.     Pharynx: Oropharynx is clear. Uvula midline. No pharyngeal swelling, oropharyngeal exudate or posterior oropharyngeal erythema.  Eyes:     General: Lids are normal. No visual field deficit or scleral icterus.       Right eye: No discharge.        Left eye: No discharge.     Extraocular Movements: Extraocular movements intact.     Conjunctiva/sclera:     Right eye: Right conjunctiva is not injected. No exudate.    Left eye: Left conjunctiva is not injected. No exudate.    Pupils: Pupils are equal, round, and reactive to light.     Visual Fields: Right eye visual fields normal and left eye visual fields normal.  Neck:     Thyroid: No thyromegaly.     Vascular: No carotid bruit.     Trachea: Trachea normal.  Cardiovascular:     Rate and Rhythm: Normal rate and regular rhythm.     Heart sounds: Normal heart sounds. No murmur heard.    No gallop.  Pulmonary:     Effort: Pulmonary effort is  normal. No accessory muscle usage or respiratory distress.     Breath sounds: Normal breath sounds.  Chest:  Breasts:    Right: Normal.     Left: Normal.  Abdominal:     General: Bowel sounds are normal.     Palpations: Abdomen is soft. There is no hepatomegaly or splenomegaly.     Tenderness: There is no abdominal tenderness.  Musculoskeletal:        General: Normal range of motion.     Cervical back: Normal range of motion and neck supple.     Thoracic back: Scoliosis present.     Lumbar back: Scoliosis present.     Right lower leg: No edema.     Left lower leg: No edema.     Comments: Kyphosis noted.  Lymphadenopathy:     Head:  Right side of head: No submental, submandibular, tonsillar, preauricular or posterior auricular adenopathy.     Left side of head: No submental, submandibular, tonsillar, preauricular or posterior auricular adenopathy.     Cervical: No cervical adenopathy.     Upper Body:     Right upper body: No supraclavicular, axillary or pectoral adenopathy.     Left upper body: No supraclavicular, axillary or pectoral adenopathy.  Skin:    General: Skin is warm and dry.     Capillary Refill: Capillary refill takes less than 2 seconds.     Findings: No rash.  Neurological:     Mental Status: She is alert.     Gait: Gait is intact.     Deep Tendon Reflexes: Reflexes are normal and symmetric.     Reflex Scores:      Brachioradialis reflexes are 2+ on the right side and 2+ on the left side.      Patellar reflexes are 2+ on the right side and 2+ on the left side.    Comments: Oriented to provider, year and month.  Not oriented to date or day of week.  Psychiatric:        Attention and Perception: Attention normal.        Mood and Affect: Mood normal.        Speech: Speech normal.        Behavior: Behavior normal. Behavior is cooperative.        Thought Content: Thought content normal.        Judgment: Judgment normal.       08/22/2022    3:37 PM 03/01/2021     4:01 PM  MMSE - Mini Mental State Exam  Orientation to time 3 5  Orientation to Place 2 4  Registration 3 3  Attention/ Calculation 4 5  Recall 0 1  Language- name 2 objects 2 2  Language- repeat 1 1  Language- follow 3 step command 3 3  Language- read & follow direction 1 1  Write a sentence 1 1  Copy design 0 1  Total score 20 27    Results for orders placed or performed in visit on 02/17/22  Comprehensive metabolic panel  Result Value Ref Range   Glucose 90 70 - 99 mg/dL   BUN 15 8 - 27 mg/dL   Creatinine, Ser 0.67 0.57 - 1.00 mg/dL   eGFR 92 >59 mL/min/1.73   BUN/Creatinine Ratio 22 12 - 28   Sodium 142 134 - 144 mmol/L   Potassium 4.5 3.5 - 5.2 mmol/L   Chloride 103 96 - 106 mmol/L   CO2 26 20 - 29 mmol/L   Calcium 9.2 8.7 - 10.3 mg/dL   Total Protein 6.9 6.0 - 8.5 g/dL   Albumin 4.4 3.7 - 4.7 g/dL   Globulin, Total 2.5 1.5 - 4.5 g/dL   Albumin/Globulin Ratio 1.8 1.2 - 2.2   Bilirubin Total 0.7 0.0 - 1.2 mg/dL   Alkaline Phosphatase 44 44 - 121 IU/L   AST 22 0 - 40 IU/L   ALT 12 0 - 32 IU/L  Lipid Panel w/o Chol/HDL Ratio  Result Value Ref Range   Cholesterol, Total 163 100 - 199 mg/dL   Triglycerides 55 0 - 149 mg/dL   HDL 64 >39 mg/dL   VLDL Cholesterol Cal 11 5 - 40 mg/dL   LDL Chol Calc (NIH) 88 0 - 99 mg/dL  VITAMIN D 25 Hydroxy (Vit-D Deficiency, Fractures)  Result Value Ref Range  Vit D, 25-Hydroxy 38.4 30.0 - 100.0 ng/mL  TSH  Result Value Ref Range   TSH 1.380 0.450 - 4.500 uIU/mL      Assessment & Plan:   Problem List Items Addressed This Visit       Cardiovascular and Mediastinum   Hypertension    Chronic, diet-controlled, stable with BP at goal for her age.  Continue monitoring BP at home 3 mornings a week and documenting for provider.  Will monitor and initiate medications if consistently elevated.  LABS: CBC, CMP, TSH.  Focus on DASH diet.      Relevant Medications   atorvastatin (LIPITOR) 10 MG tablet   Other Relevant Orders    CBC with Differential/Platelet   Comprehensive metabolic panel   TSH     Digestive   GERD (gastroesophageal reflux disease)    Chronic, stable with Prilosec.  Denies any symptoms at this time and no use of TUMS. Continue current medication regimen and adjust as needed.  Mag level annually.  Risks of PPI use were discussed with patient including bone loss, C. Diff diarrhea, pneumonia, infections, CKD, electrolyte abnormalities.  Verbalizes understanding and chooses to continue the medication.       Relevant Orders   Magnesium     Musculoskeletal and Integument   Osteoporosis    Chronic.  Noted initially on DEXA March 2021 with mild improvement recent repeat April 2023.  She is taking daily Calcium and Vit D3, to continue this.  Continue Fosamax (consider holiday in 2026), educated her at length on this.  Repeat DEXA in April 2025.  Monitor for fractures or falls.  Vit D level today.      Relevant Orders   VITAMIN D 25 Hydroxy (Vit-D Deficiency, Fractures)     Other   Depression, major, single episode, moderate (HCC)    Chronic, stable with only occasional Buspar use, is doing well at this time without Trazodone. Denies SI/HI.  Continue Buspar as needed.  Adjust regimen as needed based on mood and scores.  Refills sent.      Relevant Medications   busPIRone (BUSPAR) 5 MG tablet   ALPRAZolam (XANAX) 0.5 MG tablet   Other Relevant Orders   TSH   Generalized anxiety disorder    Chronic, ongoing.  Continue current medication regimen, has benefit from Buspar as needed.   Adjust regimen as needed.  Refills sent.      Relevant Medications   busPIRone (BUSPAR) 5 MG tablet   ALPRAZolam (XANAX) 0.5 MG tablet   Hypercholesteremia    Chronic, ongoing.  Continue current medication regimen and adjust as needed.  Lipid panel today.      Relevant Medications   atorvastatin (LIPITOR) 10 MG tablet   Other Relevant Orders   Comprehensive metabolic panel   Lipid Panel w/o Chol/HDL Ratio    Insomnia    Doing well without Trazodone at this time, restart as needed.  Due to age will try to keep medication regimen <11 medications.      Memory changes    MMSE 20/30 today which is a decline from previous 27/30.  Continue to monitor and restart memory aide if needed.  MRI order placed and ordered Xanax to take prior.  Discussed with daughter-in-law and patient PCP concerns with memory testing on visit today.  This is change for her.  Return in 4 weeks.  Consider neurology in future.      Relevant Orders   Vitamin B12   MR Brain Wo Contrast   Other  Visit Diagnoses     Medicare annual wellness visit, subsequent    -  Primary   Medicare wellness due and performed today.   Flu vaccine need       Flu vaccine in office today and discussed with patient.   Relevant Orders   Flu Vaccine QUAD High Dose(Fluad) (Completed)   Need for Td vaccine       Td vaccine in office today, discussed with patient.   Relevant Orders   Td vaccine greater than or equal to 7yo preservative free IM (Completed)   Encounter for annual physical exam       Annual physical today with labs and health maintenance reviewed, discussed with patient.        Follow up plan: Return in about 4 weeks (around 09/19/2022) for Memory Changes.   LABORATORY TESTING:  - Pap smear: not applicable  IMMUNIZATIONS:   - Tdap: Tetanus vaccination status reviewed: Provided today - Influenza: Provided today - Pneumovax: Up to date - Prevnar: Up to date - HPV: Not applicable - Zostavax vaccine: Refused  SCREENING: -Mammogram: Up to date last 12/12/21 - Colonoscopy: Refused  - Bone Density: Up to date  -Hearing Test: Not applicable  -Spirometry: Not applicable   PATIENT COUNSELING:   Advised to take 1 mg of folate supplement per day if capable of pregnancy.   Sexuality: Discussed sexually transmitted diseases, partner selection, use of condoms, avoidance of unintended pregnancy  and contraceptive alternatives.    Advised to avoid cigarette smoking.  I discussed with the patient that most people either abstain from alcohol or drink within safe limits (<=14/week and <=4 drinks/occasion for males, <=7/weeks and <= 3 drinks/occasion for females) and that the risk for alcohol disorders and other health effects rises proportionally with the number of drinks per week and how often a drinker exceeds daily limits.  Discussed cessation/primary prevention of drug use and availability of treatment for abuse.   Diet: Encouraged to adjust caloric intake to maintain  or achieve ideal body weight, to reduce intake of dietary saturated fat and total fat, to limit sodium intake by avoiding high sodium foods and not adding table salt, and to maintain adequate dietary potassium and calcium preferably from fresh fruits, vegetables, and low-fat dairy products.    Stressed the importance of regular exercise  Injury prevention: Discussed safety belts, safety helmets, smoke detector, smoking near bedding or upholstery.   Dental health: Discussed importance of regular tooth brushing, flossing, and dental visits.    NEXT PREVENTATIVE PHYSICAL DUE IN 1 YEAR. Return in about 4 weeks (around 09/19/2022) for Memory Changes.

## 2022-08-22 NOTE — Assessment & Plan Note (Addendum)
Chronic.  Noted initially on DEXA March 2021 with mild improvement recent repeat April 2023.  She is taking daily Calcium and Vit D3, to continue this.  Continue Fosamax (consider holiday in 2026), educated her at length on this.  Repeat DEXA in April 2025.  Monitor for fractures or falls.  Vit D level today.

## 2022-08-22 NOTE — Assessment & Plan Note (Signed)
Chronic, ongoing.  Continue current medication regimen, has benefit from Buspar as needed.   Adjust regimen as needed.  Refills sent.

## 2022-08-22 NOTE — Assessment & Plan Note (Signed)
Chronic, diet-controlled, stable with BP at goal for her age.  Continue monitoring BP at home 3 mornings a week and documenting for provider.  Will monitor and initiate medications if consistently elevated.  LABS: CBC, CMP, TSH.  Focus on DASH diet.

## 2022-08-22 NOTE — Assessment & Plan Note (Signed)
Chronic, stable with only occasional Buspar use, is doing well at this time without Trazodone. Denies SI/HI.  Continue Buspar as needed.  Adjust regimen as needed based on mood and scores.  Refills sent.

## 2022-08-22 NOTE — Assessment & Plan Note (Signed)
MMSE 20/30 today which is a decline from previous 27/30.  Continue to monitor and restart memory aide if needed.  MRI order placed and ordered Xanax to take prior.  Discussed with daughter-in-law and patient PCP concerns with memory testing on visit today.  This is change for her.  Return in 4 weeks.  Consider neurology in future.

## 2022-08-22 NOTE — Assessment & Plan Note (Signed)
Doing well without Trazodone at this time, restart as needed.  Due to age will try to keep medication regimen <11 medications.

## 2022-08-22 NOTE — Assessment & Plan Note (Signed)
Chronic, ongoing.  Continue current medication regimen and adjust as needed. Lipid panel today. 

## 2022-08-22 NOTE — Assessment & Plan Note (Signed)
Chronic, stable with Prilosec.  Denies any symptoms at this time and no use of TUMS. Continue current medication regimen and adjust as needed.  Mag level annually.  Risks of PPI use were discussed with patient including bone loss, C. Diff diarrhea, pneumonia, infections, CKD, electrolyte abnormalities.  Verbalizes understanding and chooses to continue the medication.

## 2022-08-23 LAB — CBC WITH DIFFERENTIAL/PLATELET
Basophils Absolute: 0 10*3/uL (ref 0.0–0.2)
Basos: 1 %
EOS (ABSOLUTE): 0.2 10*3/uL (ref 0.0–0.4)
Eos: 3 %
Hematocrit: 37.5 % (ref 34.0–46.6)
Hemoglobin: 12.9 g/dL (ref 11.1–15.9)
Immature Grans (Abs): 0 10*3/uL (ref 0.0–0.1)
Immature Granulocytes: 0 %
Lymphocytes Absolute: 2.1 10*3/uL (ref 0.7–3.1)
Lymphs: 35 %
MCH: 34 pg — ABNORMAL HIGH (ref 26.6–33.0)
MCHC: 34.4 g/dL (ref 31.5–35.7)
MCV: 99 fL — ABNORMAL HIGH (ref 79–97)
Monocytes Absolute: 0.4 10*3/uL (ref 0.1–0.9)
Monocytes: 6 %
Neutrophils Absolute: 3.3 10*3/uL (ref 1.4–7.0)
Neutrophils: 55 %
Platelets: 365 10*3/uL (ref 150–450)
RBC: 3.79 x10E6/uL (ref 3.77–5.28)
RDW: 13.1 % (ref 11.7–15.4)
WBC: 6 10*3/uL (ref 3.4–10.8)

## 2022-08-23 LAB — COMPREHENSIVE METABOLIC PANEL
ALT: 11 IU/L (ref 0–32)
AST: 16 IU/L (ref 0–40)
Albumin/Globulin Ratio: 1.8 (ref 1.2–2.2)
Albumin: 4.3 g/dL (ref 3.8–4.8)
Alkaline Phosphatase: 46 IU/L (ref 44–121)
BUN/Creatinine Ratio: 16 (ref 12–28)
BUN: 10 mg/dL (ref 8–27)
Bilirubin Total: 0.6 mg/dL (ref 0.0–1.2)
CO2: 25 mmol/L (ref 20–29)
Calcium: 9.6 mg/dL (ref 8.7–10.3)
Chloride: 103 mmol/L (ref 96–106)
Creatinine, Ser: 0.64 mg/dL (ref 0.57–1.00)
Globulin, Total: 2.4 g/dL (ref 1.5–4.5)
Glucose: 86 mg/dL (ref 70–99)
Potassium: 4.2 mmol/L (ref 3.5–5.2)
Sodium: 142 mmol/L (ref 134–144)
Total Protein: 6.7 g/dL (ref 6.0–8.5)
eGFR: 93 mL/min/{1.73_m2} (ref >59)

## 2022-08-23 LAB — LIPID PANEL W/O CHOL/HDL RATIO
Cholesterol, Total: 180 mg/dL (ref 100–199)
HDL: 69 mg/dL (ref >39)
LDL Chol Calc (NIH): 98 mg/dL (ref 0–99)
Triglycerides: 68 mg/dL (ref 0–149)
VLDL Cholesterol Cal: 13 mg/dL (ref 5–40)

## 2022-08-23 LAB — VITAMIN D 25 HYDROXY (VIT D DEFICIENCY, FRACTURES): Vit D, 25-Hydroxy: 24.2 ng/mL — ABNORMAL LOW (ref 30.0–100.0)

## 2022-08-23 LAB — TSH: TSH: 1.05 u[IU]/mL (ref 0.450–4.500)

## 2022-08-23 LAB — VITAMIN B12: Vitamin B-12: 1199 pg/mL (ref 232–1245)

## 2022-08-23 LAB — MAGNESIUM: Magnesium: 2.2 mg/dL (ref 1.6–2.3)

## 2022-08-23 NOTE — Progress Notes (Signed)
Contacted via [facility], your labs have returned and overall these are stable with exception of Vitamin D which remains a little low.  Please ensure you are taking Vitamin D3 2000 units daily.  Any questions? Keep being awesome!!!  Thank you for allowing me to participate in your care.  I appreciate you. Kindest regards, Katheleen Stella

## 2022-08-29 ENCOUNTER — Encounter: Payer: Self-pay | Admitting: Nurse Practitioner

## 2022-08-31 ENCOUNTER — Ambulatory Visit
Admission: RE | Admit: 2022-08-31 | Discharge: 2022-08-31 | Disposition: A | Discharge status: Home / Self Care | Payer: Medicare HMO | Source: Ambulatory Visit | Attending: Nurse Practitioner | Admitting: Nurse Practitioner

## 2022-08-31 DIAGNOSIS — G319 Degenerative disease of nervous system, unspecified: Secondary | ICD-10-CM | POA: Diagnosis not present

## 2022-08-31 DIAGNOSIS — I6782 Cerebral ischemia: Secondary | ICD-10-CM | POA: Diagnosis not present

## 2022-08-31 DIAGNOSIS — R413 Other amnesia: Secondary | ICD-10-CM | POA: Diagnosis not present

## 2022-08-31 DIAGNOSIS — I639 Cerebral infarction, unspecified: Secondary | ICD-10-CM | POA: Diagnosis not present

## 2022-09-01 ENCOUNTER — Other Ambulatory Visit: Payer: Self-pay | Admitting: Nurse Practitioner

## 2022-09-01 MED ORDER — DONEPEZIL HCL 5 MG PO TABS: 5.0000 mg | ORAL_TABLET | Freq: Every day | ORAL | 12 refills | Status: DC

## 2022-09-01 NOTE — Progress Notes (Signed)
Contacted via [facility] -- attempted to call initially and left general HIPAA complaint message with her son (DPR) -- alerted them will message via MyChart   Good evening everyone. I did attempt to call Randall Hiss to review these results and left a general message to check MyChart, so I hope you are able to review.  Imaging has returned and a couple things: - There is a remote (meaning old -- could be years back) very small infarct (or stroke) noted in one area of the brain, this is something that we see at times where a patient has had a small event but not enough to show any symptoms during that time or notice changes. For this I want Betsaida to continue her cholesterol medication daily for prevention. - The concerning finding was there is moderate volume loss to the hippocampal area of brain bilaterally.  This area of the brain can affect visual and verbal memories.  At times we see this volume loss with Alzheimer's.  I would like to get Jeniece into neurology and start her on Aricept daily -- this I a memory medication to help slow down changes.  There is no cure at this time for dementia, but we at times can slow down the changes.  We will discuss more on 30th at your visit.  Please let me know you receive this message and if any questions? Keep being amazing!!  Thank you for allowing me to participate in your care.  I appreciate you. Kindest regards, Reannah Totten

## 2022-09-02 ENCOUNTER — Encounter: Payer: Self-pay | Admitting: Nurse Practitioner

## 2022-09-12 ENCOUNTER — Encounter: Payer: Self-pay | Admitting: Nurse Practitioner

## 2022-09-12 DIAGNOSIS — H9193 Unspecified hearing loss, bilateral: Secondary | ICD-10-CM

## 2022-09-16 DIAGNOSIS — Z8673 Personal history of transient ischemic attack (TIA), and cerebral infarction without residual deficits: Secondary | ICD-10-CM | POA: Insufficient documentation

## 2022-09-16 NOTE — Patient Instructions (Signed)
Mild Neurocognitive Disorder Mild neurocognitive disorder, formerly known as mild cognitive impairment, is a disorder in which memory does not work as well as it should. This disorder may also cause problems with other mental functions, including thought, communication, behavior, and completion of tasks. These problems can be noticed and measured, but they usually do not interfere with daily activities or the ability to live independently. Mild neurocognitive disorder typically develops after 75 years of age, but it can also develop at younger ages. It is not as serious as major neurocognitive disorder, also known as dementia, but it may be the first sign of it. Generally, symptoms of this condition get worse over time. In rare cases, symptoms can get better. What are the causes? This condition may be caused by: Brain disorders like Alzheimer's disease, Parkinson's disease, and other conditions that gradually damage nerve cells (neurodegenerative conditions). Diseases that affect blood vessels in the brain and result in small strokes. Certain infections, such as HIV. Traumatic brain injury. Other medical conditions, such as brain tumors, underactive thyroid (hypothyroidism), and vitamin B12 deficiency. Use of certain drugs or prescription medicines. What increases the risk? The following factors may make you more likely to develop this condition: Being older than 65 years. Being female. Low education level. Diabetes, high blood pressure, high cholesterol, and other conditions that increase the risk for blood vessel diseases. Untreated or undertreated sleep apnea. Having a certain type of gene that can be passed from parent to child (inherited). Chronic health problems such as heart disease, lung disease, liver disease, kidney disease, or depression. What are the signs or symptoms? Symptoms of this condition include: Difficulty remembering. You may: Forget names, phone numbers, or details of  recent events. Forget social events and appointments. Repeatedly forget where you put your car keys or other items. Difficulty thinking and solving problems. You may have trouble with complex tasks, such as: Paying bills. Driving in unfamiliar places. Difficulty communicating. You may have trouble: Finding the right word or naming an object. Forming a sentence that makes sense, or understanding what you read or hear. Changes in your behavior or personality. When this happens, you may: Lose interest in the things that you used to enjoy. Withdraw from social situations. Get angry more easily than usual. Act before thinking. How is this diagnosed? This condition is diagnosed based on: Your symptoms. Your health care provider may ask you and the people you spend time with, such as family and friends, about your symptoms. Evaluation of mental functions (neuropsychological testing). Your health care provider may refer you to a neurologist or mental health specialist to evaluate your mental functions in detail. To identify the cause of your condition, your health care provider may: Get a detailed medical history. Ask about use of alcohol, drugs, and prescription medicines. Do a physical exam. Order blood tests and brain imaging exams. How is this treated? Mild neurocognitive disorder that is caused by medicine use, drug use, infection, or another medical condition may improve when the cause is treated, or when medicines or drugs are stopped. If this disorder has another cause, it generally does not improve and may get worse. In these cases, the goal of treatment is to help you manage the loss of mental function. Treatments in these cases include: Medicine. Medicine mainly helps memory and behavior symptoms. Talk therapy. Talk therapy provides education, emotional support, memory aids, and other ways of making up for problems with mental function. Lifestyle changes, including: Getting regular  exercise. Eating a healthy diet   that includes omega-3 fatty acids. Challenging your thinking and memory skills. Having more social interaction. Follow these instructions at home: Eating and drinking  Drink enough fluid to keep your urine pale yellow. Eat a healthy diet that includes omega-3 fatty acids. These can be found in: Fish. Nuts. Leafy vegetables. Vegetable oils. If you drink alcohol: Limit how much you use to: 0-1 drink a day for women. 0-2 drinks a day for men. Be aware of how much alcohol is in your drink. In the [place] one drink equals one 12 oz bottle of beer (355 mL), one 5 oz glass of wine (148 mL), or one 1 oz glass of hard liquor (44 mL). Lifestyle  Get regular exercise as told by your health care provider. Do not use any products that contain nicotine or tobacco, such as cigarettes, e-cigarettes, and chewing tobacco. If you need help quitting, ask your health care provider. Practice ways to manage stress. If you need help managing stress, ask your health care provider. Continue to have social interaction. Keep your mind active with stimulating activities you enjoy, such as reading or playing games. Make sure to get quality sleep. Follow these tips: Avoid napping during the day. Keep your sleeping area dark and cool. Avoid exercising during the few hours before you go to bed. Avoid caffeine products in the evening. General instructions Take over-the-counter and prescription medicines only as told by your health care provider. Your health care provider may recommend that you avoid taking medicines that can affect thinking, such as pain medicines or sleep medicines. Work with your health care provider to find out what you need help with and what your safety needs are. Keep all follow-up visits. This is important. Where to find more information National Institute on Aging: www.nia.nih.gov Contact a health care provider if: You have any new symptoms. Get help right  away if: You develop new confusion or your confusion gets worse. You act in ways that place you or your family in danger. Summary Mild neurocognitive disorder is a disorder in which memory does not work as well as it should. Mild neurocognitive disorder can have many causes. It may be the first stage of dementia. To manage your condition, get regular exercise, keep your mind active, get quality sleep, and eat a healthy diet. This information is not intended to replace advice given to you by your health care provider. Make sure you discuss any questions you have with your health care provider. Document Revised: 12/22/2019 Document Reviewed: 12/22/2019 Elsevier Patient Education  2023 Elsevier Inc.  

## 2022-09-19 ENCOUNTER — Ambulatory Visit (INDEPENDENT_AMBULATORY_CARE_PROVIDER_SITE_OTHER): Payer: Medicare HMO | Admitting: Nurse Practitioner

## 2022-09-19 ENCOUNTER — Encounter: Payer: Self-pay | Admitting: Nurse Practitioner

## 2022-09-19 VITALS — BP 124/72 | HR 69 | Temp 98.6°F | Ht 60.98 in | Wt 142.8 lb

## 2022-09-19 DIAGNOSIS — Z8673 Personal history of transient ischemic attack (TIA), and cerebral infarction without residual deficits: Secondary | ICD-10-CM

## 2022-09-19 DIAGNOSIS — R413 Other amnesia: Secondary | ICD-10-CM

## 2022-09-19 NOTE — Assessment & Plan Note (Signed)
MMSE 20/30 last visit which is a decline from previous 27/30 and MRI noting changes.  Continue Aricept at 5 MG for now and increase at future visit if continues to tolerate.  Referral to neurology placed and discussed with patient.  Lab work recently reassuring.

## 2022-09-19 NOTE — Progress Notes (Signed)
BP 124/72   Pulse 69   Temp 98.6 F (37 C) (Oral)   Ht 5' 0.98" (1.549 m)   Wt 142 lb 12.8 oz (64.8 kg)   LMP 02/01/1991 (Approximate)   BMI 27.00 kg/m    Subjective:    Patient ID: Emily Watson, female    DOB: Sep 11, 1945, 77 y.o.   MRN: [id number]  HPI: Emily Watson is a 77 y.o. female  Chief Complaint  Patient presents with   Memory Changes   Son and daughter-in-law at bedside to assist with HPI.    MEMORY CHANGES: Initial diagnosis on 08/22/22 after MMSE testing and imaging -- MRI noting moderate bilateral hippocampal volume loss and tiny remote infarct in the right cerebellar hemisphere + mild chronic microvascular changes.  She was started on Aricept 5 MG daily without side effects.  Her son reports certain recipes she has not been able to recall and difficulty with Juel Burrow when playing.  Her sister, middle sister, had dementia later in life.  Does have loss of smell going on for years, prior to Covid.       08/22/2022    3:37 PM 03/01/2021    4:01 PM  MMSE - Mini Mental State Exam  Orientation to time 3 5  Orientation to Place 2 4  Registration 3 3  Attention/ Calculation 4 5  Recall 0 1  Language- name 2 objects 2 2  Language- repeat 1 1  Language- follow 3 step command 3 3  Language- read & follow direction 1 1  Write a sentence 1 1  Copy design 0 1  Total score 20 27    Relevant past medical, surgical, family and social history reviewed and updated as indicated. Interim medical history since our last visit reviewed. Allergies and medications reviewed and updated.  Review of Systems  Constitutional:  Negative for activity change, appetite change, diaphoresis, fatigue and fever.  Respiratory:  Negative for cough, chest tightness and shortness of breath.   Cardiovascular:  Negative for chest pain, palpitations and leg swelling.  Gastrointestinal: Negative.   Neurological: Negative.   Psychiatric/Behavioral:  Negative for decreased concentration, self-injury,  sleep disturbance and suicidal ideas. The patient is nervous/anxious.     Per HPI unless specifically indicated above     Objective:    BP 124/72   Pulse 69   Temp 98.6 F (37 C) (Oral)   Ht 5' 0.98" (1.549 m)   Wt 142 lb 12.8 oz (64.8 kg)   LMP 02/01/1991 (Approximate)   BMI 27.00 kg/m   Wt Readings from Last 3 Encounters:  09/19/22 142 lb 12.8 oz (64.8 kg)  08/22/22 144 lb (65.3 kg)  02/17/22 145 lb (65.8 kg)    Physical Exam Vitals and nursing note reviewed.  Constitutional:      General: She is awake. She is not in acute distress.    Appearance: She is well-developed. She is obese. She is not ill-appearing.  HENT:     Head: Normocephalic.     Right Ear: Hearing normal.     Left Ear: Hearing normal.  Eyes:     General: Lids are normal.        Right eye: No discharge.        Left eye: No discharge.     Conjunctiva/sclera: Conjunctivae normal.     Pupils: Pupils are equal, round, and reactive to light.  Neck:     Thyroid: No thyromegaly.     Vascular: No carotid bruit.  Cardiovascular:  Rate and Rhythm: Normal rate and regular rhythm.     Heart sounds: Normal heart sounds. No murmur heard.    No gallop.  Pulmonary:     Effort: Pulmonary effort is normal. No accessory muscle usage or respiratory distress.     Breath sounds: Normal breath sounds.  Abdominal:     General: Bowel sounds are normal.     Palpations: Abdomen is soft.  Musculoskeletal:     Cervical back: Normal range of motion and neck supple.     Right lower leg: No edema.     Left lower leg: No edema.  Skin:    General: Skin is warm and dry.  Neurological:     Mental Status: She is alert and oriented to person, place, and time.     Deep Tendon Reflexes: Reflexes are normal and symmetric.     Reflex Scores:      Brachioradialis reflexes are 2+ on the right side and 2+ on the left side.      Patellar reflexes are 2+ on the right side and 2+ on the left side. Psychiatric:        Attention and  Perception: Attention normal.        Mood and Affect: Mood normal.        Speech: Speech normal.        Behavior: Behavior normal. Behavior is cooperative.        Thought Content: Thought content normal.    Results for orders placed or performed in visit on 08/22/22  CBC with Differential/Platelet  Result Value Ref Range   WBC 6.0 3.4 - 10.8 x10E3/uL   RBC 3.79 3.77 - 5.28 x10E6/uL   Hemoglobin 12.9 11.1 - 15.9 g/dL   Hematocrit 37.5 34.0 - 46.6 %   MCV 99 (H) 79 - 97 fL   MCH 34.0 (H) 26.6 - 33.0 pg   MCHC 34.4 31.5 - 35.7 g/dL   RDW 13.1 11.7 - 15.4 %   Platelets 365 150 - 450 x10E3/uL   Neutrophils 55 Not Estab. %   Lymphs 35 Not Estab. %   Monocytes 6 Not Estab. %   Eos 3 Not Estab. %   Basos 1 Not Estab. %   Neutrophils Absolute 3.3 1.4 - 7.0 x10E3/uL   Lymphocytes Absolute 2.1 0.7 - 3.1 x10E3/uL   Monocytes Absolute 0.4 0.1 - 0.9 x10E3/uL   EOS (ABSOLUTE) 0.2 0.0 - 0.4 x10E3/uL   Basophils Absolute 0.0 0.0 - 0.2 x10E3/uL   Immature Granulocytes 0 Not Estab. %   Immature Grans (Abs) 0.0 0.0 - 0.1 x10E3/uL  Comprehensive metabolic panel  Result Value Ref Range   Glucose 86 70 - 99 mg/dL   BUN 10 8 - 27 mg/dL   Creatinine, Ser 0.64 0.57 - 1.00 mg/dL   eGFR 93 >59 mL/min/1.73   BUN/Creatinine Ratio 16 12 - 28   Sodium 142 134 - 144 mmol/L   Potassium 4.2 3.5 - 5.2 mmol/L   Chloride 103 96 - 106 mmol/L   CO2 25 20 - 29 mmol/L   Calcium 9.6 8.7 - 10.3 mg/dL   Total Protein 6.7 6.0 - 8.5 g/dL   Albumin 4.3 3.8 - 4.8 g/dL   Globulin, Total 2.4 1.5 - 4.5 g/dL   Albumin/Globulin Ratio 1.8 1.2 - 2.2   Bilirubin Total 0.6 0.0 - 1.2 mg/dL   Alkaline Phosphatase 46 44 - 121 IU/L   AST 16 0 - 40 IU/L   ALT 11 0 - 32 IU/L  Lipid Panel w/o Chol/HDL Ratio  Result Value Ref Range   Cholesterol, Total 180 100 - 199 mg/dL   Triglycerides 68 0 - 149 mg/dL   HDL 69 >39 mg/dL   VLDL Cholesterol Cal 13 5 - 40 mg/dL   LDL Chol Calc (NIH) 98 0 - 99 mg/dL  Magnesium  Result Value  Ref Range   Magnesium 2.2 1.6 - 2.3 mg/dL  TSH  Result Value Ref Range   TSH 1.050 0.450 - 4.500 uIU/mL  VITAMIN D 25 Hydroxy (Vit-D Deficiency, Fractures)  Result Value Ref Range   Vit D, 25-Hydroxy 24.2 (L) 30.0 - 100.0 ng/mL  Vitamin B12  Result Value Ref Range   Vitamin B-12 1,199 232 - 1,245 pg/mL      Assessment & Plan:   Problem List Items Addressed This Visit       Nervous and Auditory   History of cerebral infarction - Primary    Noted on recent imaging and educated patient on this.  Continue current medication regimen.      Relevant Orders   Ambulatory referral to Neurology     Other   Memory changes    MMSE 20/30 last visit which is a decline from previous 27/30 and MRI noting changes.  Continue Aricept at 5 MG for now and increase at future visit if continues to tolerate.  Referral to neurology placed and discussed with patient.  Lab work recently reassuring.      Relevant Orders   Ambulatory referral to Neurology     Follow up plan: Return in about 5 months (around 02/28/2023) for MEMORY CHANGES, HTN/HLD, MOOD, GERD, OSTEOARTHRITIS.

## 2022-09-19 NOTE — Assessment & Plan Note (Signed)
Noted on recent imaging and educated patient on this.  Continue current medication regimen.

## 2022-09-29 ENCOUNTER — Encounter: Payer: Self-pay | Admitting: Nurse Practitioner

## 2022-10-09 ENCOUNTER — Ambulatory Visit: Payer: Self-pay

## 2022-10-09 ENCOUNTER — Ambulatory Visit
Admission: EM | Admit: 2022-10-09 | Discharge: 2022-10-09 | Disposition: A | Discharge status: Home / Self Care | Payer: Medicare HMO | Attending: Emergency Medicine | Admitting: Emergency Medicine

## 2022-10-09 DIAGNOSIS — K5900 Constipation, unspecified: Secondary | ICD-10-CM

## 2022-10-09 DIAGNOSIS — M545 Low back pain, unspecified: Secondary | ICD-10-CM | POA: Diagnosis not present

## 2022-10-09 LAB — POCT URINALYSIS DIP (MANUAL ENTRY)
Bilirubin, UA: NEGATIVE
Blood, UA: NEGATIVE
Glucose, UA: NEGATIVE mg/dL
Nitrite, UA: NEGATIVE
Protein Ur, POC: NEGATIVE mg/dL
Spec Grav, UA: 1.025 (ref 1.010–1.025)
Urobilinogen, UA: 1 E.U./dL
pH, UA: 6.5 (ref 5.0–8.0)

## 2022-10-09 NOTE — ED Triage Notes (Signed)
Patient to Urgent Care with daughter, complaints of right sided hip pain/ lower back pain (reports stabbing pain down her leg when she stands). Reports doing a lot of walking and entering/ exiting a car on Thursday night. Reports this is a big increase in physical activity.  Also reports constipation. Reports she typically has a bowel movement every other day but has not had one in three days and does not have the urge to go. Reports decrease appetite and this could possibly be contributing. Still passing gas.

## 2022-10-09 NOTE — Discharge Instructions (Addendum)
Take Tylenol as directed for your back pain.   Take Miralax and/or Colace as needed for constipation.  Increase your intake of fruits and vegetables.  Consider prunes or prune juice.   Go to the emergency department if you have worsening symptoms.    Follow up with your primary care provider.

## 2022-10-09 NOTE — ED Provider Notes (Signed)
[facility]    CSN: [id number] Arrival date & time: 10/09/22  1530      History   Chief Complaint Chief Complaint  Patient presents with   Back Pain   Constipation    HPI Emily Watson is a 77 y.o. female.  Accompanied by her daughter, patient presents with right lower back pain.  The pain started after an outing with her family on 10/05/2022  which included increased walking and getting in/out car.  The pain is non-radiating; worse with walking and weightbearing; improves with rest.  No falls or injury.  She has been using a heating pad for the pain.  Patient also reports no bowel movement for 3 days.  She denies abdominal pain, nausea, vomiting, diarrhea, dysuria, fever, chills, or other symptoms.  No treatment attempted at home.  Her medical history includes hypertension, memory changes, arthritis.     The history is provided by the patient, a relative and medical records.    Past Medical History:  Diagnosis Date   Allergy    Anaphylaxis    Anxiety    Arthritis    Carpal tunnel syndrome    Depression    GERD (gastroesophageal reflux disease)    Hypertension    patient denies   Peripheral edema    Trigger thumb of left hand     Patient Active Problem List   Diagnosis Date Noted   History of cerebral infarction 09/16/2022   Kyphosis deformity of spine 08/17/2021   Scoliosis of thoracic spine 08/17/2021   Memory changes 03/01/2021   Osteoporosis 11/04/2019   Bilateral hearing loss 06/10/2019   Insomnia 05/31/2018   Depression, major, single episode, moderate (HCC) 08/18/2016   Allergic rhinitis 06/07/2015   Hypercholesteremia 06/07/2015   Hypertension 06/07/2015   Generalized anxiety disorder 06/07/2015   GERD (gastroesophageal reflux disease) 12/31/2013    Past Surgical History:  Procedure Laterality Date   CARPAL TUNNEL RELEASE     HERNIA REPAIR  04/2009   ROTATOR CUFF REPAIR Left    TOTAL KNEE ARTHROPLASTY Left 03/27/2017   Procedure: TOTAL  KNEE ARTHROPLASTY - LEFT;  Surgeon: Hessie Knows, MD;  Location: [facility] OR[facility];  Service: Orthopedics;  Laterality: Left;    OB History   No obstetric history on file.      Home Medications    Prior to Admission medications   Medication Sig Start Date End Date Taking? Authorizing Provider  alendronate (FOSAMAX) 70 MG tablet TAKE 1 TABLET BY MOUTH EVERY 7 DAYS.  TAKE WITH A FULL GLASS OF WATER ON AN EMPTY STOMACH 05/24/22   Cannady, Henrine Screws T, NP  aspirin 81 MG EC tablet Take 81 mg by mouth daily. Swallow whole. Patient not taking: Reported on 09/19/2022    [provider]  atorvastatin (LIPITOR) 10 MG tablet TAKE 1 TABLET BY MOUTH ONCE DAILY AT 6PM 08/22/22   Cannady, Jolene T, NP  busPIRone (BUSPAR) 5 MG tablet Take 1 tablet (5 mg total) by mouth 2 (two) times daily as needed. for anxiety 08/22/22   Marnee Guarneri T, NP  Cholecalciferol (VITAMIN D3 PO) Take by mouth daily. 2000 IU (46mg)    [provider]  donepezil (ARICEPT) 5 MG tablet Take 1 tablet (5 mg total) by mouth at bedtime. 09/01/22   Cannady, JHenrine ScrewsT, NP  MAGNESIUM GLYCINATE PO Take 500 mg by mouth daily.    [provider]  omeprazole (PRILOSEC OTC) 20 MG tablet Take 1 tablet (20 mg total) by mouth daily. 12/25/15  Kathrine Haddock, NP  UNABLE TO FIND in the morning and at bedtime. Med Name: alpha brain    [provider]    Family History Family History  Problem Relation Age of Onset   Kidney disease Mother    Cancer Mother    Kidney disease Father    Cancer Father    Cancer Sister        bone   Hypertension Sister    Osteoporosis Sister    Breast cancer Sister    Other Son 71       brain tumor   Alzheimer's disease Sister    Hypertension Brother     Social History Social History   Tobacco Use   Smoking status: Former    Types: Cigarettes    Quit date: 03/14/1997    Years since quitting: 25.5   Smokeless tobacco: Never  Vaping Use   Vaping Use: Never used  Substance Use  Topics   Alcohol use: No    Alcohol/week: 0.0 standard drinks of alcohol   Drug use: No     Allergies   Codeine, Chlorpheniramine-phenylephrine, Gabapentin, Other, Peppermint oil, and Triprolidine-pseudoephedrine   Review of Systems Review of Systems  Constitutional:  Negative for chills and fever.  Respiratory:  Negative for cough and shortness of breath.   Cardiovascular:  Negative for chest pain and palpitations.  Gastrointestinal:  Positive for constipation. Negative for abdominal pain, diarrhea, nausea and vomiting.  Genitourinary:  Negative for dysuria, flank pain and hematuria.  Musculoskeletal:  Positive for back pain and gait problem. Negative for arthralgias.  Skin:  Negative for color change, rash and wound.  Neurological:  Negative for weakness and numbness.  All other systems reviewed and are negative.    Physical Exam Triage Vital Signs ED Triage Vitals [10/09/22 1654]  Enc Vitals Group     BP      Pulse Rate 60     Resp 18     Temp 97.7 F (36.5 C)     Temp src      SpO2 96 %     Weight      Height      Head Circumference      Peak Flow      Pain Score      Pain Loc      Pain Edu?      Excl. in [place]?    No data found.  Updated Vital Signs BP 128/74   Pulse 60   Temp 97.7 F (36.5 C)   Resp 18   LMP 02/01/1991 (Approximate)   SpO2 96%   Visual Acuity Right Eye Distance:   Left Eye Distance:   Bilateral Distance:    Right Eye Near:   Left Eye Near:    Bilateral Near:     Physical Exam Vitals and nursing note reviewed.  Constitutional:      General: She is not in acute distress.    Appearance: Normal appearance. She is well-developed. She is not ill-appearing.  HENT:     Mouth/Throat:     Mouth: Mucous membranes are moist.  Eyes:     Conjunctiva/sclera: Conjunctivae normal.  Cardiovascular:     Rate and Rhythm: Normal rate and regular rhythm.     Heart sounds: Normal heart sounds.  Pulmonary:     Effort: Pulmonary effort is  normal. No respiratory distress.     Breath sounds: Normal breath sounds.  Abdominal:     General: Bowel sounds are normal.  Palpations: Abdomen is soft.     Tenderness: There is no abdominal tenderness. There is no right CVA tenderness, left CVA tenderness, guarding or rebound.  Musculoskeletal:        General: No tenderness, deformity or signs of injury.     Cervical back: Neck supple.     Comments: Strength 5/5 in all extremities. Sensation intact.   Skin:    General: Skin is warm and dry.     Capillary Refill: Capillary refill takes less than 2 seconds.     Findings: No bruising, erythema, lesion or rash.  Neurological:     Mental Status: She is alert.     Sensory: No sensory deficit.     Motor: No weakness.     Gait: Gait abnormal.     Comments: Ambulatory short distances with cane.  Able to move from wheelchair to table with assistance.  Psychiatric:        Mood and Affect: Mood normal.        Behavior: Behavior normal.      UC Treatments / Results  Labs (all labs ordered are listed, but only abnormal results are displayed) Labs Reviewed  POCT URINALYSIS DIP (MANUAL ENTRY) - Abnormal; Notable for the following components:      Result Value   Ketones, POC UA trace (5) (*)    Leukocytes, UA Small (1+) (*)    All other components within normal limits    EKG   Radiology No results found.  Procedures Procedures (including critical care time)  Medications Ordered in UC Medications - No data to display  Initial Impression / Assessment and Plan / UC Course  I have reviewed the triage vital signs and the nursing notes.  Pertinent labs & imaging results that were available during my care of the patient were reviewed by me and considered in my medical decision making (see chart for details).    Acute right low back pain without sciatica, Constipation.  Afebrile, VSS.  Discussed Tylenol as needed for discomfort.  Discussed symptomatic treatment of constipation  including MiraLAX or Colace, increase fiber intake.  Education provided on back pain and constipation.  ED precautions discussed.  Instructed patient to follow-up with her PCP.  Patient and her daughter agreed to plan of care.  Final Clinical Impressions(s) / UC Diagnoses   Final diagnoses:  Acute right-sided low back pain without sciatica  Constipation, unspecified constipation type     Discharge Instructions      Take Tylenol as directed for your back pain.   Take Miralax and/or Colace as needed for constipation.  Increase your intake of fruits and vegetables.  Consider prunes or prune juice.   Go to the emergency department if you have worsening symptoms.    Follow up with your primary care provider.        ED Prescriptions   None    PDMP not reviewed this encounter.   Sharion Balloon, NP 10/09/22 [phone]

## 2022-10-09 NOTE — Telephone Encounter (Addendum)
Call from [place]. She is with pt at [facility] [address].  Erline Levine called regarding "orders" placed. I explained that I cannot give orders. Pt was sent to UC to see if she was impacted, since she has not had a BM in 3 days.And to evaluate pt for her back pain.  Chief Complaint: Back pain Symptoms: Back pain and side pain 9/10 - has not had a BM in 3 days. No urge to go Frequency: Saturday Pertinent Negatives: Patient denies Urge for a BM Disposition: []$ ED /[x]$ Urgent Care (no appt availability in office) / []$ Appointment(In office/virtual)/ []$  [place] [facility]/ []$ Home Care/ []$ Refused Recommended Disposition /[]$ [place] [facility]/ []$  Follow-up with PCP Additional Notes: Pt reports back and right side pain of 9/10. Pt also reports no BM for 3 days. Pt usually has a BM every day. PT states that they have no urge. PT will go to UC.    Reason for Disposition  [1] SEVERE back pain (e.g., excruciating, unable to do any normal activities) AND [2] not improved 2 hours after pain medicine  Answer Assessment - Initial Assessment Questions 1. ONSET: "When did the pain begin?"      Yesterday  2. LOCATION: "Where does it hurt?" (upper, mid or lower back)     Lower back and side 3. SEVERITY: "How bad is the pain?"  (e.g., Scale 1-10; mild, moderate, or severe)   - MILD (1-3): Doesn't interfere with normal activities.    - MODERATE (4-7): Interferes with normal activities or awakens from sleep.    - SEVERE (8-10): Excruciating pain, unable to do any normal activities.      9/10 4. PATTERN: "Is the pain constant?" (e.g., yes, no; constant, intermittent)      constant 5. RADIATION: "Does the pain shoot into your legs or somewhere else?"     no 6. CAUSE:  "What do you think is causing the back pain?"      unsure 7. BACK OVERUSE:  "Any recent lifting of heavy objects, strenuous work or exercise?"     no 8. MEDICINES: "What have you taken so far for the pain?" (e.g., nothing, acetaminophen, NSAIDS)      nothing 9. NEUROLOGIC SYMPTOMS: "Do you have any weakness, numbness, or problems with bowel/bladder control?"     Has not had a bm in 3 days. 10. OTHER SYMPTOMS: "Do you have any other symptoms?" (e.g., fever, abdomen pain, burning with urination, blood in urine)       Back pain 11. PREGNANCY: "Is there any chance you are pregnant?" "When was your last menstrual period?"  Answer Assessment - Initial Assessment Questions 1. STOOL PATTERN OR FREQUENCY: "How often do you have a bowel movement (BM)?"  (Normal range: 3 times a day to every 3 days)  "When was your last BM?"       3 days ago 2. STRAINING: "Do you have to strain to have a BM?"      Feels like she does not need to go 3. RECTAL PAIN: "Does your rectum hurt when the stool comes out?" If Yes, ask: "Do you have hemorrhoids? How bad is the pain?"  (Scale 1-10; or mild, moderate, severe)      4. STOOL COMPOSITION: "Are the stools hard?"       5. BLOOD ON STOOLS: "Has there been any blood on the toilet tissue or on the surface of the BM?" If Yes, ask: "When was the last time?"     *No Answer* 6. CHRONIC CONSTIPATION: "Is this a  new problem for you?"  If No, ask: "How long have you had this problem?" (days, weeks, months)      *No Answer* 7. CHANGES IN DIET OR HYDRATION: "Have there been any recent changes in your diet?" "How much fluids are you drinking on a daily basis?"  "How much have you had to drink today?"     *No Answer* 8. MEDICINES: "Have you been taking any new medicines?" "Are you taking any narcotic pain medicines?" (e.g., Dilaudid, morphine, Percocet, Vicodin)     *No Answer* 9. LAXATIVES: "Have you been using any stool softeners, laxatives, or enemas?"  If Yes, ask "What, how often, and when was the last time?"     *No Answer* 10. ACTIVITY:  "How much walking do you do every day?"  "Has your activity level decreased in the past week?"        *No Answer* 11. CAUSE: "What do you think is causing the constipation?"        *No  Answer* 12. OTHER SYMPTOMS: "Do you have any other symptoms?" (e.g., abdomen pain, bloating, fever, vomiting)       *No Answer* 13. MEDICAL HISTORY: "Do you have a history of hemorrhoids, rectal fissures, or rectal surgery or rectal abscess?"         *No Answer* 14. PREGNANCY: "Is there any chance you are pregnant?" "When was your last menstrual period?"       *No Answer*  Protocols used: Back Pain-A-AH, Constipation-A-AH

## 2022-10-13 ENCOUNTER — Ambulatory Visit (INDEPENDENT_AMBULATORY_CARE_PROVIDER_SITE_OTHER): Payer: Medicare HMO | Admitting: Physician Assistant

## 2022-10-13 ENCOUNTER — Encounter: Payer: Self-pay | Admitting: Physician Assistant

## 2022-10-13 ENCOUNTER — Ambulatory Visit: Payer: Self-pay

## 2022-10-13 VITALS — BP 117/69 | HR 65 | Temp 99.4°F | Ht 60.98 in | Wt 142.2 lb

## 2022-10-13 DIAGNOSIS — R8281 Pyuria: Secondary | ICD-10-CM | POA: Diagnosis not present

## 2022-10-13 DIAGNOSIS — M533 Sacrococcygeal disorders, not elsewhere classified: Secondary | ICD-10-CM | POA: Diagnosis not present

## 2022-10-13 DIAGNOSIS — K5900 Constipation, unspecified: Secondary | ICD-10-CM | POA: Diagnosis not present

## 2022-10-13 LAB — WET PREP FOR TRICH, YEAST, CLUE
Clue Cell Exam: NEGATIVE
Trichomonas Exam: NEGATIVE
Yeast Exam: NEGATIVE

## 2022-10-13 MED ORDER — MELOXICAM 7.5 MG PO TABS: 7.5000 mg | ORAL_TABLET | Freq: Every day | ORAL | 0 refills | Status: DC

## 2022-10-13 NOTE — Progress Notes (Signed)
Acute Office Visit   Patient: Emily Watson   DOB: [birth year]   77 y.o. Female  MRN: [id number] Visit Date: 10/13/2022  Today's healthcare provider: Dani Gobble Arnie Maiolo, PA-C  Introduced myself to the patient as a Journalist, newspaper and provided education on APPs in clinical practice.    Chief Complaint  Patient presents with   Back Pain    Patient was taken to [facility] on Monday, patient was constipated and still experiencing pain in her R hip area. Patient was informed to take Miralax and Tylenol every six hours for pain. Patient was told it could be Sciatica. Patient had a BM on Tuesday and Wednesday, patient was experiencing diarrhea.    Constipation    Patient says the doctor were concerned about a small bowel obstruction and could be sitting on a nerve. Patient has not had a bowel movement since Wednesday. Patient declines using the Miralax as they were informed to stop after the 1st bowel movement.    Subjective    Back Pain Pertinent negatives include no abdominal pain.  Constipation Associated symptoms include back pain. Pertinent negatives include no abdominal pain or rectal pain.   HPI     Back Pain    Additional comments: Patient was taken to [facility] on Monday, patient was constipated and still experiencing pain in her R hip area. Patient was informed to take Miralax and Tylenol every six hours for pain. Patient was told it could be Sciatica. Patient had a BM on Tuesday and Wednesday, patient was experiencing diarrhea.         Constipation    Additional comments: Patient says the doctor were concerned about a small bowel obstruction and could be sitting on a nerve. Patient has not had a bowel movement since Wednesday. Patient declines using the Miralax as they were informed to stop after the 1st bowel movement.       Last edited by Irena Reichmann, [place] on 10/13/2022  1:32 PM.      Patient is here with her daughter who is assisting with HPI  Constipation    Onset:  gradual  Duration: started Monday   Associated symptoms: she is having constipation and SI joint pain  Interventions: Miralax and prune juice, had a normal bowel movement on Tues- states when she had her bowel movement she had a lot of relief  Had loose stool on Wed  She has not used the restroom yesterday or today   She reports she is having pain in her right buttock over Si joint  She was told to use Tylenol for pain        Medications: Outpatient Medications Prior to Visit  Medication Sig   alendronate (FOSAMAX) 70 MG tablet TAKE 1 TABLET BY MOUTH EVERY 7 DAYS.  TAKE WITH A FULL GLASS OF WATER ON AN EMPTY STOMACH   atorvastatin (LIPITOR) 10 MG tablet TAKE 1 TABLET BY MOUTH ONCE DAILY AT 6PM   busPIRone (BUSPAR) 5 MG tablet Take 1 tablet (5 mg total) by mouth 2 (two) times daily as needed. for anxiety   Cholecalciferol (VITAMIN D3 PO) Take by mouth daily. 2000 IU (46mg)   donepezil (ARICEPT) 5 MG tablet Take 1 tablet (5 mg total) by mouth at bedtime.   MAGNESIUM GLYCINATE PO Take 500 mg by mouth daily.   omeprazole (PRILOSEC OTC) 20 MG tablet Take 1 tablet (20 mg total) by mouth daily.   UNABLE TO FIND in the morning and at bedtime.  Med Name: alpha brain   aspirin 81 MG EC tablet Take 81 mg by mouth daily. Swallow whole. (Patient not taking: Reported on 09/19/2022)   No facility-administered medications prior to visit.    Review of Systems  Gastrointestinal:  Positive for constipation. Negative for abdominal pain, blood in stool and rectal pain.  Musculoskeletal:  Positive for back pain.       Objective    BP 117/69   Pulse 65   Temp 99.4 F (37.4 C) (Oral)   Ht 5' 0.98" (1.549 m)   Wt 142 lb 3.2 oz (64.5 kg)   LMP 02/01/1991 (Approximate)   SpO2 98%   BMI 26.89 kg/m    Physical Exam Vitals reviewed.  Constitutional:      General: She is awake.     Appearance: Normal appearance. She is well-developed and well-groomed.  HENT:     Head: Normocephalic and  atraumatic.  Pulmonary:     Effort: Pulmonary effort is normal.  Musculoskeletal:     Cervical back: Normal range of motion.       Legs:  Neurological:     General: No focal deficit present.     Mental Status: She is alert and oriented to person, place, and time. Mental status is at baseline.     GCS: GCS eye subscore is 4. GCS verbal subscore is 5. GCS motor subscore is 6.     Cranial Nerves: Cranial nerves 2-12 are intact. No dysarthria or facial asymmetry.     Motor: No weakness, tremor, atrophy or abnormal muscle tone.  Psychiatric:        Behavior: Behavior is cooperative.       No results found for any visits on 10/13/22.  Assessment & Plan      No follow-ups on file.      Problem List Items Addressed This Visit   None Visit Diagnoses     Constipation, unspecified constipation type    -  Primary Acute, new concern Patient and daughter report she has had intermittent bouts of constipation that appears to be relieved with Miralax and juices at home She reports regular bowel movement and loose stool on Tues and Wed respectively with Miralax use Recommend she continue to use Miralax until she is having regular, daily bowel movements then they can taper as needed to dose that ensures regularity  Discussed fiber supplementation and gradual Miralax taper over several weeks to regain regularity  Follow up as needed for persistent or progressing symptoms    SI (sacroiliac) joint dysfunction   Acute, new concern Patient reports intense pain over right SI joint that is not improving  Reviewed anatomy and likely causes of SI joint dysfunction with patient and her daughter Reviewed most recent labs- eGFR >60 and no contraindications for NSAID use. Will send in script for Meloxicam   7.5 mg PO QD Recommend tylenol PRN for break through pain, warm compresses and stretches which were provided in AVS Reviewed doing stretches on her bed so she does not injure herself with getting on  floor Follow up as needed for persistent or progressing symptoms    Pyuria     Acute, new concern Leukocytes and ketones were noted in UA at UC  on 10/09/22 but does not appear this was addressed there Will repeat UA and cervicovaginal swab at this time Results to dictate further management Wet prep was negative for BV, trich, yeast today so awaiting UA results when patient can bring sample on Monday Follow up  as needed/ dictated by results of UA    Relevant Orders   WET PREP FOR TRICH, YEAST, CLUE   Urinalysis, Routine w reflex microscopic        No follow-ups on file.   I, Ervie Mccard E Aloysius Heinle, PA-C, have reviewed all documentation for this visit. The documentation on 10/13/22 for the exam, diagnosis, procedures, and orders are all accurate and complete.   Talitha Givens, MHS, PA-C [facility]

## 2022-10-13 NOTE — Telephone Encounter (Signed)
Patient was given appt today with Junie Panning

## 2022-10-13 NOTE — Patient Instructions (Addendum)
  I recommend taking a daily dose of Miralax until you are having regular, daily bowel movements  Once you are having regular bowel movements you can decrease the dose of Miralax by half until you are on the lowest dose that ensures a regular bowel movements  You should also add fiber to your daily diet to help with your stools   It looks like you may have some SI joint dysfunction which is likely causing your pain   I have sent in a script for Meloxicam- this is an NSAID so you don't need to take other NSAIDs while on it  You can take Tylenol for further pain control  You can use warm compresses and gentle stretches to help with the pain as well  Please let us know if you have further questions or concerns

## 2022-10-13 NOTE — Telephone Encounter (Signed)
  Chief Complaint: Right hip/buttock pain. Seen in  ED 10/09/22. Has had constipation. Had [facility] Wednesday. Taking Miralax. Symptoms: Pain Frequency: Last week. Pertinent Negatives: Patient denies  Disposition: []$ ED /[]$ Urgent Care (no appt availability in office) / []$ Appointment(In office/virtual)/ []$  [place] [facility]/ []$ Home Care/ []$ Refused Recommended Disposition /[]$ [place] [facility]/ [x]$  Follow-up with PCP Additional Notes: Daughter states she will monitor pt.'s bowel movements through the weekend. If no better will call Monday. Wants to know if ok with PCP.  Please advise. Felt better after BM, Wednesday

## 2022-10-16 LAB — URINALYSIS, ROUTINE W REFLEX MICROSCOPIC
Bilirubin, UA: NEGATIVE
Glucose, UA: NEGATIVE
Ketones, UA: NEGATIVE
Leukocytes,UA: NEGATIVE
Nitrite, UA: NEGATIVE
Protein,UA: NEGATIVE
RBC, UA: NEGATIVE
Specific Gravity, UA: <1.005 — ABNORMAL LOW (ref 1.005–1.030)
Urobilinogen, Ur: 0.2 mg/dL (ref 0.2–1.0)
pH, UA: 6 (ref 5.0–7.5)

## 2022-10-16 NOTE — Progress Notes (Signed)
Cervicovaginal swab was negative for trichomonas, yeast, and bacterial vaginosis

## 2022-10-16 NOTE — Progress Notes (Signed)
Your UA was overall normal. There does not appear to be white blood cells, red blood cells or other concerning signs of infection at this time. No treatment is indicated at this time.

## 2022-10-17 DIAGNOSIS — H903 Sensorineural hearing loss, bilateral: Secondary | ICD-10-CM | POA: Diagnosis not present

## 2022-11-02 ENCOUNTER — Encounter: Payer: Self-pay | Admitting: Nurse Practitioner

## 2022-12-05 DIAGNOSIS — H35363 Drusen (degenerative) of macula, bilateral: Secondary | ICD-10-CM | POA: Diagnosis not present

## 2022-12-05 DIAGNOSIS — Z961 Presence of intraocular lens: Secondary | ICD-10-CM | POA: Diagnosis not present

## 2022-12-05 DIAGNOSIS — H43813 Vitreous degeneration, bilateral: Secondary | ICD-10-CM | POA: Diagnosis not present

## 2022-12-25 DIAGNOSIS — L600 Ingrowing nail: Secondary | ICD-10-CM | POA: Diagnosis not present

## 2022-12-25 DIAGNOSIS — B351 Tinea unguium: Secondary | ICD-10-CM | POA: Diagnosis not present

## 2022-12-25 DIAGNOSIS — M79674 Pain in right toe(s): Secondary | ICD-10-CM | POA: Diagnosis not present

## 2022-12-25 DIAGNOSIS — M79675 Pain in left toe(s): Secondary | ICD-10-CM | POA: Diagnosis not present

## 2023-02-08 ENCOUNTER — Other Ambulatory Visit: Payer: Self-pay | Admitting: Nurse Practitioner

## 2023-02-08 ENCOUNTER — Encounter: Payer: Self-pay | Admitting: Nurse Practitioner

## 2023-02-08 DIAGNOSIS — Z1231 Encounter for screening mammogram for malignant neoplasm of breast: Secondary | ICD-10-CM

## 2023-02-21 ENCOUNTER — Encounter: Payer: Self-pay | Admitting: Nurse Practitioner

## 2023-02-21 ENCOUNTER — Ambulatory Visit (INDEPENDENT_AMBULATORY_CARE_PROVIDER_SITE_OTHER): Payer: Medicare HMO | Admitting: Nurse Practitioner

## 2023-02-21 VITALS — BP 129/60 | HR 53 | Temp 98.0°F | Wt 131.0 lb

## 2023-02-21 DIAGNOSIS — I10 Essential (primary) hypertension: Secondary | ICD-10-CM | POA: Diagnosis not present

## 2023-02-21 DIAGNOSIS — R413 Other amnesia: Secondary | ICD-10-CM

## 2023-02-21 DIAGNOSIS — F411 Generalized anxiety disorder: Secondary | ICD-10-CM | POA: Diagnosis not present

## 2023-02-21 DIAGNOSIS — M810 Age-related osteoporosis without current pathological fracture: Secondary | ICD-10-CM

## 2023-02-21 DIAGNOSIS — Z8673 Personal history of transient ischemic attack (TIA), and cerebral infarction without residual deficits: Secondary | ICD-10-CM

## 2023-02-21 DIAGNOSIS — R609 Edema, unspecified: Secondary | ICD-10-CM

## 2023-02-21 DIAGNOSIS — E7800 Pure hypercholesterolemia, unspecified: Secondary | ICD-10-CM

## 2023-02-21 DIAGNOSIS — F321 Major depressive disorder, single episode, moderate: Secondary | ICD-10-CM

## 2023-02-21 MED ORDER — BUSPIRONE HCL 5 MG PO TABS: 5.0000 mg | ORAL_TABLET | Freq: Two times a day (BID) | ORAL | 4 refills | Status: DC

## 2023-02-21 MED ORDER — DONEPEZIL HCL 10 MG PO TABS: 10.0000 mg | ORAL_TABLET | Freq: Every day | ORAL | 4 refills | Status: DC

## 2023-02-21 NOTE — Patient Instructions (Addendum)
Start Glucerna shakes 3 times day.  Call neurology to schedule at:                                                            Office                                                           [phone]  Management of Memory Problems  There are some general things you can do to help manage your memory problems.  Your memory may not in fact recover, but by using techniques and strategies you will be able to manage your memory difficulties better.  1)  Establish a routine. Try to establish and then stick to a regular routine.  By doing this, you will get used to what to expect and you will reduce the need to rely on your memory.  Also, try to do things at the same time of day, such as taking your medication or checking your calendar first thing in the morning. Think about think that you can do as a part of a regular routine and make a list.  Then enter them into a daily planner to remind you.  This will help you establish a routine.  2)  Organize your environment. Organize your environment so that it is uncluttered.  Decrease visual stimulation.  Place everyday items such as keys or cell phone in the same place every day (ie.  Basket next to front door) Use post it notes with a brief message to yourself (ie. Turn off light, lock the door) Use labels to indicate where things go (ie. Which cupboards are for food, dishes, etc.) Keep a notepad and pen by the telephone to take messages  3)  Memory Aids A diary or journal/notebook/daily planner Making a list (shopping list, chore list, to do list that needs to be done) Using an alarm as a reminder (kitchen timer or cell phone alarm) Using cell phone to store information (Notes, Calendar, Reminders) Calendar/White board placed in a prominent position Post-it notes  In order for memory aids to be useful, you need to have good habits.  It's no good remembering to make a note in your journal if you don't remember to look in it.  Try setting aside a  certain time of day to look in journal.  4)  Improving mood and managing fatigue. There may be other factors that contribute to memory difficulties.  Factors, such as anxiety, depression and tiredness can affect memory. Regular gentle exercise can help improve your mood and give you more energy. Simple relaxation techniques may help relieve symptoms of anxiety Try to get back to completing activities or hobbies you enjoyed doing in the past. Learn to pace yourself through activities to decrease fatigue. Find out about some local support groups where you can share experiences with others. Try and achieve 7-8 hours of sleep at night.

## 2023-02-21 NOTE — Assessment & Plan Note (Signed)
Chronic, ongoing.  Noted initially on DEXA March 2021 with mild improvement recent repeat April 2023.  She is taking daily Calcium and Vit D3, to continue this.  Continue Fosamax (consider holiday in 2026), educated her at length on this.  Repeat DEXA in April 2025.  Monitor for fractures or falls.  Vit D level today.

## 2023-02-21 NOTE — Assessment & Plan Note (Signed)
Noted on recent imaging 09/01/22.  Continue current medication regimen, however family is wishing to trial holding her statin for next few months and rechecking levels next visit.  Discussed at length role of statin in stroke prevention, however will trial this change.

## 2023-02-21 NOTE — Assessment & Plan Note (Signed)
Chronic, exacerbated. Denies SI/HI.  Recommend restart Buspar 5 MG BID, or can do 10 MG daily if easier.  Adjust regimen as needed based on mood and scores.  Refills sent.

## 2023-02-21 NOTE — Assessment & Plan Note (Signed)
Due to sitting with legs down in chair most of day.  Recommend use of compression hose on at home during day and off at night.  Elevated legs as much as possible.  Suspect some protein calorie malnutrition, check labs today and recommend protein shakes TID.

## 2023-02-21 NOTE — Progress Notes (Signed)
BP 129/60   Pulse (!) 53   Temp 98 F (36.7 C) (Oral)   Wt 131 lb (59.4 kg)   LMP 02/01/1991 (Approximate)   SpO2 99%   BMI 24.77 kg/m    Subjective:    Patient ID: Emily Watson, female    DOB: 1945/08/30, 77 y.o.   MRN: [id number]  HPI: Emily Watson is a 77 y.o. female  Chief Complaint  Patient presents with   Dementia   Depression   Hyperlipidemia   Hypertension   Clarisse Gouge, daughter-in-law, at bedside to assist.    MEMORY CHANGES: Initial diagnosis on 08/22/22 after MMSE testing and imaging -- MRI noting moderate bilateral hippocampal volume loss and tiny remote infarct in the right cerebellar hemisphere + mild chronic microvascular changes.  Taking Aricept 5 MG daily.  Her middle sister had dementia later in life.  Has lost 11 pounds since visit in January.  Is eating three meals a day, son makes every meal -- these are not always what she wants to eat (low sugar, salt, and carbs).  When she goes out to eat, will eat whole meals. Continues to use bathroom on own, does have some episodes incontinence urine, wearing briefs.  No recent falls or fractures. Is using cane more often for balance as family reports more unsteady gait and weakness.  Is scheduled to see neurology in August, initial appointment the office cancelled.    Having some edema to lower legs due to sitting in chair often during day and not elevating.  Does not have recliner at home.    08/22/2022    3:37 PM 03/01/2021    4:01 PM  MMSE - Mini Mental State Exam  Orientation to time 3 5  Orientation to Place 2 4  Registration 3 3  Attention/ Calculation 4 5  Recall 0 1  Language- name 2 objects 2 2  Language- repeat 1 1  Language- follow 3 step command 3 3  Language- read & follow direction 1 1  Write a sentence 1 1  Copy design 0 1  Total score 20 27       02/21/2023    4:45 PM 07/02/2020    9:21 AM 06/09/2020    9:50 AM 06/19/2018   11:53 AM 06/18/2017    8:18 AM  6CIT Screen  What Year? 4  points 0 points 0 points 0 points 0 points  What month? 3 points 0 points 0 points 0 points 0 points  What time? 3 points 0 points 0 points 0 points 0 points  Count back from 20 0 points 0 points 0 points 0 points 0 points  Months in reverse 4 points 0 points 2 points 0 points 0 points  Repeat phrase 10 points 2 points 10 points 0 points 4 points  Total Score 24 points 2 points 12 points 0 points 4 points   HYPERTENSION / HYPERLIPIDEMIA Continues on Lipitor for cholesterol.  Currently no BP medications, as has been stable.  Satisfied with current treatment? yes Duration of hypertension: chronic BP monitoring frequency: a couple times a week BP range: 120/80 range at home Duration of hyperlipidemia: chronic Cholesterol medication side effects: no Cholesterol supplements: none Medication compliance: good compliance Aspirin: yes Recent stressors: no Recurrent headaches: no Visual changes: no Palpitations: no Dyspnea: no Chest pain: no Lower extremity edema: no Dizzy/lightheaded: no    OSTEOPOROSIS Repeat DEXA April 2023 noted T-score -2.8. Taking Fosamax weekly and supplements, Vit D and Calcium. Satisfied with current treatment?: yes  Medication side effects: no Medication compliance: good compliance Past osteoporosis medications/treatments:  Adequate calcium & vitamin D: yes Intolerance to bisphosphonates:no Weight bearing exercises: yes  DEPRESSION/ANXIETY Has Buspar to take as needed -- is not currently taking.  Took Trazodone in past.   Mood status: exacerbated Satisfied with current treatment?: yes Symptom severity: mild  Duration of current treatment : chronic Side effects: no Medication compliance: good compliance Psychotherapy/counseling: none Depressed mood: occasional Anxious mood: occasional Anhedonia: no Significant weight loss or gain: no Insomnia: yes Fatigue: no Feelings of worthlessness or guilt: no Impaired concentration/indecisiveness: no Suicidal  ideations: no Hopelessness: no Crying spells: no    02/21/2023    4:49 PM 09/19/2022    2:08 PM 08/22/2022    3:12 PM 02/17/2022    2:52 PM 08/17/2021    3:31 PM  Depression screen PHQ 2/9  Decreased Interest 2 1 1  0 0  Down, Depressed, Hopeless 2 0 2 0 1  PHQ - 2 Score 4 1 3  0 1  Altered sleeping 0 0 0 0 0  Tired, decreased energy 3 0 0 0 0  Change in appetite 0 0 0 0 0  Feeling bad or failure about yourself  0 0 0 0 0  Trouble concentrating 3 1 1  0 0  Moving slowly or fidgety/restless 2 2 1  0 0  Suicidal thoughts 0 0 0 0 0  PHQ-9 Score 12 4 5  0 1  Difficult doing work/chores Somewhat difficult Somewhat difficult Somewhat difficult Not difficult at all Not difficult at all       02/21/2023    4:49 PM 09/19/2022    2:09 PM 08/22/2022    3:12 PM 02/17/2022    2:52 PM  GAD 7 : Generalized Anxiety Score  Nervous, Anxious, on Edge 3 1 2  0  Control/stop worrying 3 1 1  0  Worry too much - different things 3 3 2  0  Trouble relaxing 2 1 0 0  Restless 0 0 0 0  Easily annoyed or irritable 3 2 2  0  Afraid - awful might happen 0 0 0 0  Total GAD 7 Score 14 8 7  0  Anxiety Difficulty Somewhat difficult Not difficult at all Somewhat difficult Not difficult at all   Relevant past medical, surgical, family and social history reviewed and updated as indicated. Interim medical history since our last visit reviewed. Allergies and medications reviewed and updated.  Review of Systems  Constitutional:  Negative for activity change, appetite change, diaphoresis, fatigue and fever.  Respiratory:  Negative for cough, chest tightness and shortness of breath.   Cardiovascular:  Negative for chest pain, palpitations and leg swelling.  Gastrointestinal: Negative.   Neurological: Negative.   Psychiatric/Behavioral:  Negative for decreased concentration, self-injury, sleep disturbance and suicidal ideas. The patient is nervous/anxious.    Per HPI unless specifically indicated above     Objective:    BP  129/60   Pulse (!) 53   Temp 98 F (36.7 C) (Oral)   Wt 131 lb (59.4 kg)   LMP 02/01/1991 (Approximate)   SpO2 99%   BMI 24.77 kg/m   Wt Readings from Last 3 Encounters:  02/21/23 131 lb (59.4 kg)  10/13/22 142 lb 3.2 oz (64.5 kg)  09/19/22 142 lb 12.8 oz (64.8 kg)    Physical Exam Vitals and nursing note reviewed.  Constitutional:      General: She is awake. She is not in acute distress.    Appearance: She is well-developed and well-groomed. She  is not ill-appearing or toxic-appearing.  HENT:     Head: Normocephalic.     Right Ear: Hearing and external ear normal.     Left Ear: Hearing and external ear normal.  Eyes:     General: Lids are normal.        Right eye: No discharge.        Left eye: No discharge.     Conjunctiva/sclera: Conjunctivae normal.     Pupils: Pupils are equal, round, and reactive to light.  Neck:     Thyroid: No thyromegaly.     Vascular: No carotid bruit.  Cardiovascular:     Rate and Rhythm: Regular rhythm. Bradycardia present.     Heart sounds: Normal heart sounds. No murmur heard.    No gallop.  Pulmonary:     Effort: Pulmonary effort is normal. No accessory muscle usage or respiratory distress.     Breath sounds: Normal breath sounds.  Abdominal:     General: Bowel sounds are normal.     Palpations: Abdomen is soft.  Musculoskeletal:     Cervical back: Normal range of motion and neck supple.     Right lower leg: 1+ Edema present.     Left lower leg: 1+ Edema present.  Lymphadenopathy:     Cervical: No cervical adenopathy.  Skin:    General: Skin is warm and dry.  Neurological:     Mental Status: She is alert.     Cranial Nerves: Cranial nerves 2-12 are intact.     Coordination: Coordination is intact.     Gait: Gait is intact.     Deep Tendon Reflexes: Reflexes are normal and symmetric.     Reflex Scores:      Brachioradialis reflexes are 2+ on the right side and 2+ on the left side.      Patellar reflexes are 2+ on the right side  and 2+ on the left side.    Comments: Not oriented to place, person, time.  Pleasantly confused.  Psychiatric:        Attention and Perception: Attention normal.        Mood and Affect: Mood normal.        Speech: Speech normal.        Behavior: Behavior normal. Behavior is cooperative.        Thought Content: Thought content normal.        Cognition and Memory: Memory is impaired.    Results for orders placed or performed in visit on 10/13/22  WET PREP FOR TRICH, YEAST, CLUE   Specimen: Vaginal; Urine   Urine  Result Value Ref Range   Trichomonas Exam Negative Negative   Yeast Exam Negative Negative   Clue Cell Exam Negative Negative  Urinalysis, Routine w reflex microscopic  Result Value Ref Range   Specific Gravity, UA <1.005 (L) 1.005 - 1.030   pH, UA 6.0 5.0 - 7.5   Color, UA Yellow Yellow   Appearance Ur Clear Clear   Leukocytes,UA Negative Negative   Protein,UA Negative Negative/Trace   Glucose, UA Negative Negative   Ketones, UA Negative Negative   RBC, UA Negative Negative   Bilirubin, UA Negative Negative   Urobilinogen, Ur 0.2 0.2 - 1.0 mg/dL   Nitrite, UA Negative Negative   Microscopic Examination Comment       Assessment & Plan:   Problem List Items Addressed This Visit       Cardiovascular and Mediastinum   Hypertension    Chronic, diet-controlled, stable  with BP at goal for her age.  Continue monitoring BP at home 3 mornings a week and documenting for provider.  Will monitor and initiate medications if consistently elevated.  LABS: CMP.  Focus on DASH diet.      Relevant Orders   Comprehensive metabolic panel     Nervous and Auditory   History of cerebral infarction    Noted on recent imaging 09/01/22.  Continue current medication regimen, however family is wishing to trial holding her statin for next few months and rechecking levels next visit.  Discussed at length role of statin in stroke prevention, however will trial this change.         Musculoskeletal and Integument   Osteoporosis    Chronic, ongoing.  Noted initially on DEXA March 2021 with mild improvement recent repeat April 2023.  She is taking daily Calcium and Vit D3, to continue this.  Continue Fosamax (consider holiday in 2026), educated her at length on this.  Repeat DEXA in April 2025.  Monitor for fractures or falls.  Vit D level today.      Relevant Orders   VITAMIN D 25 Hydroxy (Vit-D Deficiency, Fractures)     Other   Dependent edema    Due to sitting with legs down in chair most of day.  Recommend use of compression hose on at home during day and off at night.  Elevated legs as much as possible.  Suspect some protein calorie malnutrition, check labs today and recommend protein shakes TID.      Depression, major, single episode, moderate (HCC) - Primary    Chronic, exacerbated. Denies SI/HI.  Recommend restart Buspar 5 MG BID, or can do 10 MG daily if easier.  Adjust regimen as needed based on mood and scores.  Refills sent.      Relevant Medications   busPIRone (BUSPAR) 5 MG tablet   Generalized anxiety disorder    Chronic, exacerbated. Denies SI/HI.  Recommend restart Buspar 5 MG BID, or can do 10 MG daily if easier.  Adjust regimen as needed based on mood and scores.  Refills sent.      Relevant Medications   busPIRone (BUSPAR) 5 MG tablet   Hypercholesteremia    Chronic, ongoing.  Lipid panel today.  Family is wishing to trial holding her statin for next few months and rechecking levels next visit.  Discussed at length role of statin in stroke prevention, however will trial this change.      Relevant Orders   Comprehensive metabolic panel   Lipid Panel w/o Chol/HDL Ratio   Memory changes    Chronic, progressive -- has lost 11 pounds since January.  MMSE 20/30 last visit and 6CIT 24 today.  MRI noting changes. Continue Aricept, but increase to 10 MG as is tolerating and may offer more benefit.  Discussed with family.  Scheduled to see neurology  upcoming.  Lab work recently reassuring.  Order for PT/OT to work on strength upper/lower and balance for fall prevention.  Continue to collaborate with family and patient at visits.  Recommend Glucerna shakes or protein supplements TID.       Relevant Orders   Ambulatory referral to Home Health     Follow up plan: Return in about 3 months (around 05/24/2023) for DEMENTIA AND WEIGHT CHECK.

## 2023-02-21 NOTE — Assessment & Plan Note (Signed)
Chronic, diet-controlled, stable with BP at goal for her age.  Continue monitoring BP at home 3 mornings a week and documenting for provider.  Will monitor and initiate medications if consistently elevated.  LABS: CMP.  Focus on DASH diet.

## 2023-02-21 NOTE — Assessment & Plan Note (Signed)
Chronic, exacerbated. Denies SI/HI.  Recommend restart Buspar 5 MG BID, or can do 10 MG daily if easier.  Adjust regimen as needed based on mood and scores.  Refills sent. 

## 2023-02-21 NOTE — Assessment & Plan Note (Signed)
Chronic, progressive -- has lost 11 pounds since January.  MMSE 20/30 last visit and 6CIT 24 today.  MRI noting changes. Continue Aricept, but increase to 10 MG as is tolerating and may offer more benefit.  Discussed with family.  Scheduled to see neurology upcoming.  Lab work recently reassuring.  Order for PT/OT to work on strength upper/lower and balance for fall prevention.  Continue to collaborate with family and patient at visits.  Recommend Glucerna shakes or protein supplements TID.

## 2023-02-21 NOTE — Assessment & Plan Note (Signed)
Chronic, ongoing.  Lipid panel today.  Family is wishing to trial holding her statin for next few months and rechecking levels next visit.  Discussed at length role of statin in stroke prevention, however will trial this change.

## 2023-02-22 LAB — COMPREHENSIVE METABOLIC PANEL WITH GFR
ALT: 16 [IU]/L (ref 0–32)
AST: 19 [IU]/L (ref 0–40)
Albumin: 4.3 g/dL (ref 3.8–4.8)
Alkaline Phosphatase: 37 [IU]/L — ABNORMAL LOW (ref 44–121)
BUN/Creatinine Ratio: 22 (ref 12–28)
BUN: 14 mg/dL (ref 8–27)
Bilirubin Total: 1 mg/dL (ref 0.0–1.2)
CO2: 26 mmol/L (ref 20–29)
Calcium: 9.3 mg/dL (ref 8.7–10.3)
Chloride: 104 mmol/L (ref 96–106)
Creatinine, Ser: 0.63 mg/dL (ref 0.57–1.00)
Globulin, Total: 2.2 g/dL (ref 1.5–4.5)
Glucose: 79 mg/dL (ref 70–99)
Potassium: 4.1 mmol/L (ref 3.5–5.2)
Sodium: 143 mmol/L (ref 134–144)
Total Protein: 6.5 g/dL (ref 6.0–8.5)
eGFR: 93 mL/min/{1.73_m2}

## 2023-02-22 LAB — VITAMIN D 25 HYDROXY (VIT D DEFICIENCY, FRACTURES): Vit D, 25-Hydroxy: 74.7 ng/mL (ref 30.0–100.0)

## 2023-02-22 LAB — LIPID PANEL W/O CHOL/HDL RATIO
Cholesterol, Total: 169 mg/dL (ref 100–199)
HDL: 67 mg/dL (ref >39)
LDL Chol Calc (NIH): 90 mg/dL (ref 0–99)
Triglycerides: 62 mg/dL (ref 0–149)
VLDL Cholesterol Cal: 12 mg/dL (ref 5–40)

## 2023-02-22 NOTE — Progress Notes (Signed)
Contacted via MyChart -- however please call her son to ensure they see results:  Good morning Ambra and family, labs have returned: - Kidney function, creatinine and eGFR, remains normal, as is liver function, AST and ALT.  - Cholesterol labs continue to show LDL above goal for stroke prevention, would like to see <70.  However, I know you wish to trial off Atorvastatin for next 3 months -- we can try this, however if elevations present I recommend we start Rosuvastatin 20 MG daily which may offer better control.  Any questions on this? - Vitamin D level improved, continue supplement daily.   Keep being stellar!!  Thank you for allowing me to participate in your care.  I appreciate you. Kindest regards, Fin Hupp

## 2023-02-23 ENCOUNTER — Ambulatory Visit: Payer: Medicare HMO | Admitting: Nurse Practitioner

## 2023-02-23 DIAGNOSIS — F321 Major depressive disorder, single episode, moderate: Secondary | ICD-10-CM

## 2023-02-23 DIAGNOSIS — E7800 Pure hypercholesterolemia, unspecified: Secondary | ICD-10-CM

## 2023-02-23 DIAGNOSIS — F411 Generalized anxiety disorder: Secondary | ICD-10-CM

## 2023-02-23 DIAGNOSIS — M810 Age-related osteoporosis without current pathological fracture: Secondary | ICD-10-CM

## 2023-02-23 DIAGNOSIS — I10 Essential (primary) hypertension: Secondary | ICD-10-CM

## 2023-02-23 DIAGNOSIS — R413 Other amnesia: Secondary | ICD-10-CM

## 2023-02-26 ENCOUNTER — Telehealth: Payer: Self-pay | Admitting: Nurse Practitioner

## 2023-02-26 NOTE — Telephone Encounter (Signed)
Copied from CRM [id number]. Topic: General - Other >> Feb 23, 2023  3:47 PM Ja-Kwan M wrote: Reason for CRM: Pt son Minerva Areola called for pt lab results. Cb# [phone]

## 2023-02-26 NOTE — Telephone Encounter (Signed)
Patient's son was called and informed of lab results, verbalized understanding.

## 2023-02-28 DIAGNOSIS — F0394 Unspecified dementia, unspecified severity, with anxiety: Secondary | ICD-10-CM | POA: Diagnosis not present

## 2023-02-28 DIAGNOSIS — F321 Major depressive disorder, single episode, moderate: Secondary | ICD-10-CM | POA: Diagnosis not present

## 2023-02-28 DIAGNOSIS — F411 Generalized anxiety disorder: Secondary | ICD-10-CM | POA: Diagnosis not present

## 2023-02-28 DIAGNOSIS — I10 Essential (primary) hypertension: Secondary | ICD-10-CM | POA: Diagnosis not present

## 2023-02-28 DIAGNOSIS — M810 Age-related osteoporosis without current pathological fracture: Secondary | ICD-10-CM | POA: Diagnosis not present

## 2023-02-28 DIAGNOSIS — I69351 Hemiplegia and hemiparesis following cerebral infarction affecting right dominant side: Secondary | ICD-10-CM | POA: Diagnosis not present

## 2023-02-28 DIAGNOSIS — F0393 Unspecified dementia, unspecified severity, with mood disturbance: Secondary | ICD-10-CM | POA: Diagnosis not present

## 2023-02-28 DIAGNOSIS — K5900 Constipation, unspecified: Secondary | ICD-10-CM | POA: Diagnosis not present

## 2023-02-28 DIAGNOSIS — E7800 Pure hypercholesterolemia, unspecified: Secondary | ICD-10-CM | POA: Diagnosis not present

## 2023-03-02 ENCOUNTER — Ambulatory Visit
Admission: RE | Admit: 2023-03-02 | Discharge: 2023-03-02 | Disposition: A | Discharge status: Home / Self Care | Payer: Medicare HMO | Source: Ambulatory Visit | Attending: Nurse Practitioner | Admitting: Nurse Practitioner

## 2023-03-02 DIAGNOSIS — Z1231 Encounter for screening mammogram for malignant neoplasm of breast: Secondary | ICD-10-CM | POA: Diagnosis not present

## 2023-03-05 DIAGNOSIS — F321 Major depressive disorder, single episode, moderate: Secondary | ICD-10-CM | POA: Diagnosis not present

## 2023-03-05 DIAGNOSIS — M810 Age-related osteoporosis without current pathological fracture: Secondary | ICD-10-CM | POA: Diagnosis not present

## 2023-03-05 DIAGNOSIS — F411 Generalized anxiety disorder: Secondary | ICD-10-CM | POA: Diagnosis not present

## 2023-03-05 DIAGNOSIS — F0394 Unspecified dementia, unspecified severity, with anxiety: Secondary | ICD-10-CM | POA: Diagnosis not present

## 2023-03-05 DIAGNOSIS — I10 Essential (primary) hypertension: Secondary | ICD-10-CM | POA: Diagnosis not present

## 2023-03-05 DIAGNOSIS — I69351 Hemiplegia and hemiparesis following cerebral infarction affecting right dominant side: Secondary | ICD-10-CM | POA: Diagnosis not present

## 2023-03-05 DIAGNOSIS — F0393 Unspecified dementia, unspecified severity, with mood disturbance: Secondary | ICD-10-CM | POA: Diagnosis not present

## 2023-03-05 DIAGNOSIS — K5900 Constipation, unspecified: Secondary | ICD-10-CM | POA: Diagnosis not present

## 2023-03-05 DIAGNOSIS — E7800 Pure hypercholesterolemia, unspecified: Secondary | ICD-10-CM | POA: Diagnosis not present

## 2023-03-05 NOTE — Progress Notes (Signed)
Contacted via MyChart   Normal mammogram, may repeat in one year:)

## 2023-03-09 ENCOUNTER — Ambulatory Visit: Payer: Self-pay

## 2023-03-09 NOTE — Telephone Encounter (Signed)
Chief Complaint: Medication question Disposition: [] ED /[] Urgent Care (no appt availability in office) / [] Appointment(In office/virtual)/ []  [place] [facility]/ [x] Home Care/ [] Refused Recommended Disposition /[] [place] [facility]/ []  Follow-up with PCP Additional Notes: Spoke to patient's son who is listed on DPR. Minerva Areola stated that his mom ate an egg sandwich this morning and forgot that she was suppose to take her Fosamax on an empty stomach. Minerva Areola wanted to know if it was okay for his mom to take the Fosamax today. Wendi Snipes that she should wait at least two full hours before taking the Fosamax with a full glass of water according to [employer]. Eric verbalized understanding.   Answer Assessment - Initial Assessment Questions 1. NAME of MEDICINE: "What medicine(s) are you calling about?"     Fosamax  2. QUESTION: "What is your question?" (e.g., double dose of medicine, side effect)     Can I take this medication today, I ate an egg sandwhich this morning  3. PRESCRIBER: "Who prescribed the medicine?" Reason: if prescribed by specialist, call should be referred to that group.     Jolene, NP 4. SYMPTOMS: "Do you have any symptoms?" If Yes, ask: "What symptoms are you having?"  "How bad are the symptoms (e.g., mild, moderate, severe)     No  Protocols used: Medication Question Call-A-AH

## 2023-03-12 DIAGNOSIS — F0393 Unspecified dementia, unspecified severity, with mood disturbance: Secondary | ICD-10-CM | POA: Diagnosis not present

## 2023-03-12 DIAGNOSIS — K5900 Constipation, unspecified: Secondary | ICD-10-CM | POA: Diagnosis not present

## 2023-03-12 DIAGNOSIS — I69351 Hemiplegia and hemiparesis following cerebral infarction affecting right dominant side: Secondary | ICD-10-CM | POA: Diagnosis not present

## 2023-03-12 DIAGNOSIS — I10 Essential (primary) hypertension: Secondary | ICD-10-CM | POA: Diagnosis not present

## 2023-03-12 DIAGNOSIS — F0394 Unspecified dementia, unspecified severity, with anxiety: Secondary | ICD-10-CM | POA: Diagnosis not present

## 2023-03-12 DIAGNOSIS — M810 Age-related osteoporosis without current pathological fracture: Secondary | ICD-10-CM | POA: Diagnosis not present

## 2023-03-12 DIAGNOSIS — F411 Generalized anxiety disorder: Secondary | ICD-10-CM | POA: Diagnosis not present

## 2023-03-12 DIAGNOSIS — E7800 Pure hypercholesterolemia, unspecified: Secondary | ICD-10-CM | POA: Diagnosis not present

## 2023-03-12 DIAGNOSIS — F321 Major depressive disorder, single episode, moderate: Secondary | ICD-10-CM | POA: Diagnosis not present

## 2023-03-13 DIAGNOSIS — I10 Essential (primary) hypertension: Secondary | ICD-10-CM | POA: Diagnosis not present

## 2023-03-13 DIAGNOSIS — F0394 Unspecified dementia, unspecified severity, with anxiety: Secondary | ICD-10-CM | POA: Diagnosis not present

## 2023-03-13 DIAGNOSIS — M810 Age-related osteoporosis without current pathological fracture: Secondary | ICD-10-CM | POA: Diagnosis not present

## 2023-03-13 DIAGNOSIS — I69351 Hemiplegia and hemiparesis following cerebral infarction affecting right dominant side: Secondary | ICD-10-CM | POA: Diagnosis not present

## 2023-03-13 DIAGNOSIS — F411 Generalized anxiety disorder: Secondary | ICD-10-CM | POA: Diagnosis not present

## 2023-03-13 DIAGNOSIS — K5900 Constipation, unspecified: Secondary | ICD-10-CM | POA: Diagnosis not present

## 2023-03-13 DIAGNOSIS — F321 Major depressive disorder, single episode, moderate: Secondary | ICD-10-CM | POA: Diagnosis not present

## 2023-03-13 DIAGNOSIS — E7800 Pure hypercholesterolemia, unspecified: Secondary | ICD-10-CM | POA: Diagnosis not present

## 2023-03-13 DIAGNOSIS — F0393 Unspecified dementia, unspecified severity, with mood disturbance: Secondary | ICD-10-CM | POA: Diagnosis not present

## 2023-03-14 ENCOUNTER — Telehealth: Payer: Self-pay | Admitting: Nurse Practitioner

## 2023-03-14 NOTE — Telephone Encounter (Signed)
Copied from CRM [id number]. Topic: Quick Communication - Home Health Verbal Orders >> Mar 14, 2023  9:01 AM Macon Large wrote: Caller/Agency: Junious Dresser with [facility] Callback Number: [phone] Requesting OT/PT/Skilled Nursing/Social Work/Speech Therapy: OT Frequency: 1 x 6

## 2023-03-14 NOTE — Telephone Encounter (Signed)
Verbal orders given per Jolene 

## 2023-03-19 DIAGNOSIS — M810 Age-related osteoporosis without current pathological fracture: Secondary | ICD-10-CM | POA: Diagnosis not present

## 2023-03-19 DIAGNOSIS — K5900 Constipation, unspecified: Secondary | ICD-10-CM | POA: Diagnosis not present

## 2023-03-19 DIAGNOSIS — E7800 Pure hypercholesterolemia, unspecified: Secondary | ICD-10-CM | POA: Diagnosis not present

## 2023-03-19 DIAGNOSIS — F411 Generalized anxiety disorder: Secondary | ICD-10-CM | POA: Diagnosis not present

## 2023-03-19 DIAGNOSIS — F321 Major depressive disorder, single episode, moderate: Secondary | ICD-10-CM | POA: Diagnosis not present

## 2023-03-19 DIAGNOSIS — F0393 Unspecified dementia, unspecified severity, with mood disturbance: Secondary | ICD-10-CM | POA: Diagnosis not present

## 2023-03-19 DIAGNOSIS — I10 Essential (primary) hypertension: Secondary | ICD-10-CM | POA: Diagnosis not present

## 2023-03-19 DIAGNOSIS — F0394 Unspecified dementia, unspecified severity, with anxiety: Secondary | ICD-10-CM | POA: Diagnosis not present

## 2023-03-19 DIAGNOSIS — I69351 Hemiplegia and hemiparesis following cerebral infarction affecting right dominant side: Secondary | ICD-10-CM | POA: Diagnosis not present

## 2023-03-22 DIAGNOSIS — K5900 Constipation, unspecified: Secondary | ICD-10-CM | POA: Diagnosis not present

## 2023-03-22 DIAGNOSIS — F411 Generalized anxiety disorder: Secondary | ICD-10-CM | POA: Diagnosis not present

## 2023-03-22 DIAGNOSIS — F0394 Unspecified dementia, unspecified severity, with anxiety: Secondary | ICD-10-CM | POA: Diagnosis not present

## 2023-03-22 DIAGNOSIS — I10 Essential (primary) hypertension: Secondary | ICD-10-CM | POA: Diagnosis not present

## 2023-03-22 DIAGNOSIS — I69351 Hemiplegia and hemiparesis following cerebral infarction affecting right dominant side: Secondary | ICD-10-CM | POA: Diagnosis not present

## 2023-03-22 DIAGNOSIS — E7800 Pure hypercholesterolemia, unspecified: Secondary | ICD-10-CM | POA: Diagnosis not present

## 2023-03-22 DIAGNOSIS — F0393 Unspecified dementia, unspecified severity, with mood disturbance: Secondary | ICD-10-CM | POA: Diagnosis not present

## 2023-03-22 DIAGNOSIS — F321 Major depressive disorder, single episode, moderate: Secondary | ICD-10-CM | POA: Diagnosis not present

## 2023-03-22 DIAGNOSIS — M810 Age-related osteoporosis without current pathological fracture: Secondary | ICD-10-CM | POA: Diagnosis not present

## 2023-03-27 DIAGNOSIS — F411 Generalized anxiety disorder: Secondary | ICD-10-CM | POA: Diagnosis not present

## 2023-03-27 DIAGNOSIS — F321 Major depressive disorder, single episode, moderate: Secondary | ICD-10-CM | POA: Diagnosis not present

## 2023-03-27 DIAGNOSIS — I10 Essential (primary) hypertension: Secondary | ICD-10-CM | POA: Diagnosis not present

## 2023-03-27 DIAGNOSIS — M810 Age-related osteoporosis without current pathological fracture: Secondary | ICD-10-CM | POA: Diagnosis not present

## 2023-03-27 DIAGNOSIS — I69351 Hemiplegia and hemiparesis following cerebral infarction affecting right dominant side: Secondary | ICD-10-CM | POA: Diagnosis not present

## 2023-03-27 DIAGNOSIS — F0393 Unspecified dementia, unspecified severity, with mood disturbance: Secondary | ICD-10-CM | POA: Diagnosis not present

## 2023-03-27 DIAGNOSIS — E7800 Pure hypercholesterolemia, unspecified: Secondary | ICD-10-CM | POA: Diagnosis not present

## 2023-03-27 DIAGNOSIS — F0394 Unspecified dementia, unspecified severity, with anxiety: Secondary | ICD-10-CM | POA: Diagnosis not present

## 2023-03-27 DIAGNOSIS — K5900 Constipation, unspecified: Secondary | ICD-10-CM | POA: Diagnosis not present

## 2023-03-30 DIAGNOSIS — M810 Age-related osteoporosis without current pathological fracture: Secondary | ICD-10-CM | POA: Diagnosis not present

## 2023-03-30 DIAGNOSIS — E7800 Pure hypercholesterolemia, unspecified: Secondary | ICD-10-CM | POA: Diagnosis not present

## 2023-03-30 DIAGNOSIS — F321 Major depressive disorder, single episode, moderate: Secondary | ICD-10-CM | POA: Diagnosis not present

## 2023-03-30 DIAGNOSIS — I10 Essential (primary) hypertension: Secondary | ICD-10-CM | POA: Diagnosis not present

## 2023-03-30 DIAGNOSIS — K5900 Constipation, unspecified: Secondary | ICD-10-CM | POA: Diagnosis not present

## 2023-03-30 DIAGNOSIS — F0394 Unspecified dementia, unspecified severity, with anxiety: Secondary | ICD-10-CM | POA: Diagnosis not present

## 2023-03-30 DIAGNOSIS — I69351 Hemiplegia and hemiparesis following cerebral infarction affecting right dominant side: Secondary | ICD-10-CM | POA: Diagnosis not present

## 2023-03-30 DIAGNOSIS — F0393 Unspecified dementia, unspecified severity, with mood disturbance: Secondary | ICD-10-CM | POA: Diagnosis not present

## 2023-03-30 DIAGNOSIS — F411 Generalized anxiety disorder: Secondary | ICD-10-CM | POA: Diagnosis not present

## 2023-04-02 DIAGNOSIS — F411 Generalized anxiety disorder: Secondary | ICD-10-CM | POA: Diagnosis not present

## 2023-04-02 DIAGNOSIS — M810 Age-related osteoporosis without current pathological fracture: Secondary | ICD-10-CM | POA: Diagnosis not present

## 2023-04-02 DIAGNOSIS — K5900 Constipation, unspecified: Secondary | ICD-10-CM | POA: Diagnosis not present

## 2023-04-02 DIAGNOSIS — F0394 Unspecified dementia, unspecified severity, with anxiety: Secondary | ICD-10-CM | POA: Diagnosis not present

## 2023-04-02 DIAGNOSIS — I69351 Hemiplegia and hemiparesis following cerebral infarction affecting right dominant side: Secondary | ICD-10-CM | POA: Diagnosis not present

## 2023-04-02 DIAGNOSIS — E7800 Pure hypercholesterolemia, unspecified: Secondary | ICD-10-CM | POA: Diagnosis not present

## 2023-04-02 DIAGNOSIS — F321 Major depressive disorder, single episode, moderate: Secondary | ICD-10-CM | POA: Diagnosis not present

## 2023-04-02 DIAGNOSIS — F0393 Unspecified dementia, unspecified severity, with mood disturbance: Secondary | ICD-10-CM | POA: Diagnosis not present

## 2023-04-02 DIAGNOSIS — I10 Essential (primary) hypertension: Secondary | ICD-10-CM | POA: Diagnosis not present

## 2023-04-03 DIAGNOSIS — F0393 Unspecified dementia, unspecified severity, with mood disturbance: Secondary | ICD-10-CM | POA: Diagnosis not present

## 2023-04-03 DIAGNOSIS — F0394 Unspecified dementia, unspecified severity, with anxiety: Secondary | ICD-10-CM | POA: Diagnosis not present

## 2023-04-03 DIAGNOSIS — I69351 Hemiplegia and hemiparesis following cerebral infarction affecting right dominant side: Secondary | ICD-10-CM | POA: Diagnosis not present

## 2023-04-03 DIAGNOSIS — E7800 Pure hypercholesterolemia, unspecified: Secondary | ICD-10-CM | POA: Diagnosis not present

## 2023-04-03 DIAGNOSIS — F411 Generalized anxiety disorder: Secondary | ICD-10-CM | POA: Diagnosis not present

## 2023-04-03 DIAGNOSIS — F321 Major depressive disorder, single episode, moderate: Secondary | ICD-10-CM | POA: Diagnosis not present

## 2023-04-03 DIAGNOSIS — M810 Age-related osteoporosis without current pathological fracture: Secondary | ICD-10-CM | POA: Diagnosis not present

## 2023-04-03 DIAGNOSIS — I10 Essential (primary) hypertension: Secondary | ICD-10-CM | POA: Diagnosis not present

## 2023-04-03 DIAGNOSIS — K5900 Constipation, unspecified: Secondary | ICD-10-CM | POA: Diagnosis not present

## 2023-04-04 DIAGNOSIS — F411 Generalized anxiety disorder: Secondary | ICD-10-CM | POA: Diagnosis not present

## 2023-04-04 DIAGNOSIS — F0394 Unspecified dementia, unspecified severity, with anxiety: Secondary | ICD-10-CM | POA: Diagnosis not present

## 2023-04-04 DIAGNOSIS — M810 Age-related osteoporosis without current pathological fracture: Secondary | ICD-10-CM | POA: Diagnosis not present

## 2023-04-04 DIAGNOSIS — F321 Major depressive disorder, single episode, moderate: Secondary | ICD-10-CM | POA: Diagnosis not present

## 2023-04-04 DIAGNOSIS — K5900 Constipation, unspecified: Secondary | ICD-10-CM | POA: Diagnosis not present

## 2023-04-04 DIAGNOSIS — I10 Essential (primary) hypertension: Secondary | ICD-10-CM | POA: Diagnosis not present

## 2023-04-04 DIAGNOSIS — F0393 Unspecified dementia, unspecified severity, with mood disturbance: Secondary | ICD-10-CM | POA: Diagnosis not present

## 2023-04-04 DIAGNOSIS — I69351 Hemiplegia and hemiparesis following cerebral infarction affecting right dominant side: Secondary | ICD-10-CM | POA: Diagnosis not present

## 2023-04-04 DIAGNOSIS — E7800 Pure hypercholesterolemia, unspecified: Secondary | ICD-10-CM | POA: Diagnosis not present

## 2023-04-05 DIAGNOSIS — F0393 Unspecified dementia, unspecified severity, with mood disturbance: Secondary | ICD-10-CM | POA: Diagnosis not present

## 2023-04-09 DIAGNOSIS — K5900 Constipation, unspecified: Secondary | ICD-10-CM | POA: Diagnosis not present

## 2023-04-09 DIAGNOSIS — I10 Essential (primary) hypertension: Secondary | ICD-10-CM | POA: Diagnosis not present

## 2023-04-09 DIAGNOSIS — F321 Major depressive disorder, single episode, moderate: Secondary | ICD-10-CM | POA: Diagnosis not present

## 2023-04-09 DIAGNOSIS — F0393 Unspecified dementia, unspecified severity, with mood disturbance: Secondary | ICD-10-CM | POA: Diagnosis not present

## 2023-04-09 DIAGNOSIS — F0394 Unspecified dementia, unspecified severity, with anxiety: Secondary | ICD-10-CM | POA: Diagnosis not present

## 2023-04-09 DIAGNOSIS — E7800 Pure hypercholesterolemia, unspecified: Secondary | ICD-10-CM | POA: Diagnosis not present

## 2023-04-09 DIAGNOSIS — F411 Generalized anxiety disorder: Secondary | ICD-10-CM | POA: Diagnosis not present

## 2023-04-09 DIAGNOSIS — M810 Age-related osteoporosis without current pathological fracture: Secondary | ICD-10-CM | POA: Diagnosis not present

## 2023-04-09 DIAGNOSIS — I69351 Hemiplegia and hemiparesis following cerebral infarction affecting right dominant side: Secondary | ICD-10-CM | POA: Diagnosis not present

## 2023-04-11 DIAGNOSIS — I69351 Hemiplegia and hemiparesis following cerebral infarction affecting right dominant side: Secondary | ICD-10-CM | POA: Diagnosis not present

## 2023-04-11 DIAGNOSIS — M810 Age-related osteoporosis without current pathological fracture: Secondary | ICD-10-CM | POA: Diagnosis not present

## 2023-04-11 DIAGNOSIS — K5900 Constipation, unspecified: Secondary | ICD-10-CM | POA: Diagnosis not present

## 2023-04-11 DIAGNOSIS — F0394 Unspecified dementia, unspecified severity, with anxiety: Secondary | ICD-10-CM | POA: Diagnosis not present

## 2023-04-11 DIAGNOSIS — E7800 Pure hypercholesterolemia, unspecified: Secondary | ICD-10-CM | POA: Diagnosis not present

## 2023-04-11 DIAGNOSIS — I10 Essential (primary) hypertension: Secondary | ICD-10-CM | POA: Diagnosis not present

## 2023-04-11 DIAGNOSIS — F321 Major depressive disorder, single episode, moderate: Secondary | ICD-10-CM | POA: Diagnosis not present

## 2023-04-11 DIAGNOSIS — F0393 Unspecified dementia, unspecified severity, with mood disturbance: Secondary | ICD-10-CM | POA: Diagnosis not present

## 2023-04-11 DIAGNOSIS — F411 Generalized anxiety disorder: Secondary | ICD-10-CM | POA: Diagnosis not present

## 2023-04-16 DIAGNOSIS — F411 Generalized anxiety disorder: Secondary | ICD-10-CM | POA: Diagnosis not present

## 2023-04-16 DIAGNOSIS — F321 Major depressive disorder, single episode, moderate: Secondary | ICD-10-CM | POA: Diagnosis not present

## 2023-04-16 DIAGNOSIS — E7800 Pure hypercholesterolemia, unspecified: Secondary | ICD-10-CM | POA: Diagnosis not present

## 2023-04-16 DIAGNOSIS — I69351 Hemiplegia and hemiparesis following cerebral infarction affecting right dominant side: Secondary | ICD-10-CM | POA: Diagnosis not present

## 2023-04-16 DIAGNOSIS — F0393 Unspecified dementia, unspecified severity, with mood disturbance: Secondary | ICD-10-CM | POA: Diagnosis not present

## 2023-04-16 DIAGNOSIS — F0394 Unspecified dementia, unspecified severity, with anxiety: Secondary | ICD-10-CM | POA: Diagnosis not present

## 2023-04-16 DIAGNOSIS — I10 Essential (primary) hypertension: Secondary | ICD-10-CM | POA: Diagnosis not present

## 2023-04-16 DIAGNOSIS — K5900 Constipation, unspecified: Secondary | ICD-10-CM | POA: Diagnosis not present

## 2023-04-16 DIAGNOSIS — M810 Age-related osteoporosis without current pathological fracture: Secondary | ICD-10-CM | POA: Diagnosis not present

## 2023-04-17 DIAGNOSIS — M810 Age-related osteoporosis without current pathological fracture: Secondary | ICD-10-CM | POA: Diagnosis not present

## 2023-04-17 DIAGNOSIS — E7800 Pure hypercholesterolemia, unspecified: Secondary | ICD-10-CM | POA: Diagnosis not present

## 2023-04-17 DIAGNOSIS — F0394 Unspecified dementia, unspecified severity, with anxiety: Secondary | ICD-10-CM | POA: Diagnosis not present

## 2023-04-17 DIAGNOSIS — K5900 Constipation, unspecified: Secondary | ICD-10-CM | POA: Diagnosis not present

## 2023-04-17 DIAGNOSIS — I10 Essential (primary) hypertension: Secondary | ICD-10-CM | POA: Diagnosis not present

## 2023-04-17 DIAGNOSIS — F321 Major depressive disorder, single episode, moderate: Secondary | ICD-10-CM | POA: Diagnosis not present

## 2023-04-17 DIAGNOSIS — I69351 Hemiplegia and hemiparesis following cerebral infarction affecting right dominant side: Secondary | ICD-10-CM | POA: Diagnosis not present

## 2023-04-17 DIAGNOSIS — F411 Generalized anxiety disorder: Secondary | ICD-10-CM | POA: Diagnosis not present

## 2023-04-17 DIAGNOSIS — F0393 Unspecified dementia, unspecified severity, with mood disturbance: Secondary | ICD-10-CM | POA: Diagnosis not present

## 2023-04-24 DIAGNOSIS — M810 Age-related osteoporosis without current pathological fracture: Secondary | ICD-10-CM | POA: Diagnosis not present

## 2023-04-24 DIAGNOSIS — E7800 Pure hypercholesterolemia, unspecified: Secondary | ICD-10-CM | POA: Diagnosis not present

## 2023-04-24 DIAGNOSIS — F321 Major depressive disorder, single episode, moderate: Secondary | ICD-10-CM | POA: Diagnosis not present

## 2023-04-24 DIAGNOSIS — F0394 Unspecified dementia, unspecified severity, with anxiety: Secondary | ICD-10-CM | POA: Diagnosis not present

## 2023-04-24 DIAGNOSIS — I69351 Hemiplegia and hemiparesis following cerebral infarction affecting right dominant side: Secondary | ICD-10-CM | POA: Diagnosis not present

## 2023-04-24 DIAGNOSIS — F0393 Unspecified dementia, unspecified severity, with mood disturbance: Secondary | ICD-10-CM | POA: Diagnosis not present

## 2023-04-24 DIAGNOSIS — F411 Generalized anxiety disorder: Secondary | ICD-10-CM | POA: Diagnosis not present

## 2023-04-24 DIAGNOSIS — K5900 Constipation, unspecified: Secondary | ICD-10-CM | POA: Diagnosis not present

## 2023-04-24 DIAGNOSIS — I10 Essential (primary) hypertension: Secondary | ICD-10-CM | POA: Diagnosis not present

## 2023-04-25 DIAGNOSIS — F0393 Unspecified dementia, unspecified severity, with mood disturbance: Secondary | ICD-10-CM | POA: Diagnosis not present

## 2023-04-25 DIAGNOSIS — M79675 Pain in left toe(s): Secondary | ICD-10-CM | POA: Diagnosis not present

## 2023-04-25 DIAGNOSIS — M79674 Pain in right toe(s): Secondary | ICD-10-CM | POA: Diagnosis not present

## 2023-04-25 DIAGNOSIS — F0394 Unspecified dementia, unspecified severity, with anxiety: Secondary | ICD-10-CM | POA: Diagnosis not present

## 2023-04-25 DIAGNOSIS — I69351 Hemiplegia and hemiparesis following cerebral infarction affecting right dominant side: Secondary | ICD-10-CM | POA: Diagnosis not present

## 2023-04-25 DIAGNOSIS — B351 Tinea unguium: Secondary | ICD-10-CM | POA: Diagnosis not present

## 2023-04-25 DIAGNOSIS — F321 Major depressive disorder, single episode, moderate: Secondary | ICD-10-CM | POA: Diagnosis not present

## 2023-04-25 DIAGNOSIS — E7800 Pure hypercholesterolemia, unspecified: Secondary | ICD-10-CM | POA: Diagnosis not present

## 2023-04-25 DIAGNOSIS — K5900 Constipation, unspecified: Secondary | ICD-10-CM | POA: Diagnosis not present

## 2023-04-25 DIAGNOSIS — F411 Generalized anxiety disorder: Secondary | ICD-10-CM | POA: Diagnosis not present

## 2023-04-25 DIAGNOSIS — I10 Essential (primary) hypertension: Secondary | ICD-10-CM | POA: Diagnosis not present

## 2023-04-25 DIAGNOSIS — M810 Age-related osteoporosis without current pathological fracture: Secondary | ICD-10-CM | POA: Diagnosis not present

## 2023-04-27 ENCOUNTER — Telehealth: Payer: Self-pay | Admitting: Nurse Practitioner

## 2023-04-27 NOTE — Telephone Encounter (Signed)
Connie calling [facility] is calling to request verbal orders OT.  Frequency 1 W 8  CB- [phone] Verbal ok on VM

## 2023-04-27 NOTE — Telephone Encounter (Signed)
Called and LVM giving verbal orders per Jolene.  ?

## 2023-05-01 ENCOUNTER — Encounter: Payer: Self-pay | Admitting: Nurse Practitioner

## 2023-05-09 ENCOUNTER — Ambulatory Visit (INDEPENDENT_AMBULATORY_CARE_PROVIDER_SITE_OTHER): Payer: Medicare HMO | Admitting: Pediatrics

## 2023-05-09 ENCOUNTER — Ambulatory Visit: Payer: Self-pay | Admitting: *Deleted

## 2023-05-09 ENCOUNTER — Encounter: Payer: Self-pay | Admitting: Pediatrics

## 2023-05-09 VITALS — BP 108/56 | HR 55 | Temp 98.6°F | Wt 131.2 lb

## 2023-05-09 DIAGNOSIS — L03119 Cellulitis of unspecified part of limb: Secondary | ICD-10-CM

## 2023-05-09 DIAGNOSIS — R031 Nonspecific low blood-pressure reading: Secondary | ICD-10-CM | POA: Diagnosis not present

## 2023-05-09 DIAGNOSIS — R0982 Postnasal drip: Secondary | ICD-10-CM

## 2023-05-09 MED ORDER — DOXYCYCLINE HYCLATE 100 MG PO TABS: 100.0000 mg | ORAL_TABLET | Freq: Two times a day (BID) | ORAL | 0 refills | Status: DC

## 2023-05-09 MED ORDER — LIDOCAINE-PRILOCAINE 2.5-2.5 % EX CREA: 1.0000 | TOPICAL_CREAM | CUTANEOUS | 0 refills | Status: DC | PRN

## 2023-05-09 MED ORDER — FLUTICASONE PROPIONATE 50 MCG/ACT NA SUSP: 2.0000 | Freq: Every day | NASAL | 6 refills | Status: DC

## 2023-05-09 NOTE — Progress Notes (Signed)
Acute Visit  BP (!) 108/56   Pulse (!) 55   Temp 98.6 F (37 C) (Oral)   Wt 131 lb 3.2 oz (59.5 kg)   LMP 02/01/1991 (Approximate)   SpO2 96%   BMI 24.81 kg/m    Subjective:    Patient ID: Emily Watson, female    DOB: June 15, 1946, 77 y.o.   MRN: [id number]  HPI: Emily Watson is a 77 y.o. female  Chief Complaint  Patient presents with   Lumps appearing on the arms     Pt states she first noticed them yesterday. Pt states its not painful just itchy    Upper extremity swelling Insect bites Patient reported lesions to her daughter in law, at bedside yesterday Noted them tracking up arm on the left, very itchy Developed left arm swelling No throat closure, difficulty breathing Today woke up with other patch of redness on the right, welting up as well Denies fevers, chills, nausea, vomiting She does not recall any tick bites, no spider bites Unaware of any insect in or around house, lives alone No pus coming from these lesions  Runny nose "As long as she can remember" Sometimes wakes up with phlegm or injected/tearful eyes No recent illness, chronic for her She has not tried any medication No cough, sob, chest pain, no headaches  Relevant past medical, surgical, family and social history reviewed and updated as indicated. Interim medical history since our last visit reviewed. Allergies and medications reviewed and updated.  ROS per HPI unless specifically indicated above     Objective:    BP (!) 108/56   Pulse (!) 55   Temp 98.6 F (37 C) (Oral)   Wt 131 lb 3.2 oz (59.5 kg)   LMP 02/01/1991 (Approximate)   SpO2 96%   BMI 24.81 kg/m   Wt Readings from Last 3 Encounters:  05/09/23 131 lb 3.2 oz (59.5 kg)  02/21/23 131 lb (59.4 kg)  10/13/22 142 lb 3.2 oz (64.5 kg)     Physical Exam Constitutional:      Appearance: Normal appearance.  Pulmonary:     Effort: Pulmonary effort is normal.  Musculoskeletal:        General: Normal range of motion.   Skin:    Findings: Rash present. Rash is papular and urticarial.          Comments: Has erythematous patch on the R proximal forearm, warm to touch with 2-3 papules. Multiple papules on L mid/distal forearm appear to be insect bites, unable to see bite marks. Has notable swelling over left forearm without fluctuance or pain with palpation of the area. No other rash visualized on truck on back, or other extremities.  Neurological:     General: No focal deficit present.     Mental Status: She is alert. Mental status is at baseline.  Psychiatric:        Mood and Affect: Mood normal.        Behavior: Behavior normal.        Thought Content: Thought content normal.        Assessment & Plan:  Assessment & Plan   Cellulitis of upper extremity, unspecified laterality Patient presenting w erythematous rash with swelling bilaterally in setting of suspected bug bites. I instructed patient and family to assess bed/bedding for any potential insect. He bites do not appear to be scabies and/or bed bugs, but I am concerned given the tracking bites noted on the left arm. Suspect developed superimposed non-complicated cellulitis. Given doxy  BID for 5 days and emla cream to help with itching. Has close follow up arranged in 2 days to reassess for improvement. Pt given strict return precautions. -     Lidocaine-Prilocaine; Apply 1 Application topically as needed.  Dispense: 30 g; Refill: 0 -     Doxycycline Hyclate; Take 1 tablet (100 mg total) by mouth 2 (two) times daily for 5 days.  Dispense: 10 tablet; Refill: 0  Post-nasal drip Describing sx c/f post nasal drip vs allergies. Noted to be a chronic issue, may benefit from trial of flonase.  -     Fluticasone Propionate; Place 2 sprays into both nostrils daily.  Dispense: 16 g; Refill: 6  Low blood pressure reading Noted today. Asymptomatic. Will continue to monitor.   Follow up plan: Return if symptoms worsen or fail to improve.  Tiffney Haughton Howell Pringle,  MD

## 2023-05-09 NOTE — Patient Instructions (Addendum)
Antibiotic: doxycyline 100mg  twice daily for 5 days  EMLA will help numb the area for the itchiness

## 2023-05-09 NOTE — Telephone Encounter (Signed)
No appointments available with pt's PCP, son was okay with her seeing Dr. Evelene Croon today.

## 2023-05-09 NOTE — Telephone Encounter (Signed)
  Chief Complaint: lump to left forearm, pain and swelling per pt daughter in law Symptoms: long lump to left forearm , reddish, swelling pain . Approx 3 inches in length, getting bigger . Unsure if bite mark  Frequency: last night  Pertinent Negatives: Patient denies fever. Disposition: [] ED /[] Urgent Care (no appt availability in office) / [x] Appointment(In office/virtual)/ []  [place] [facility]/ [] Home Care/ [] Refused Recommended Disposition /[] [place] [facility]/ []  Follow-up with PCP Additional Notes:   Appt scheduled with provider not PCP no available appt with PCP. Patient daughter in law reports patient will be nervous and only want J. Langley Gauss, NP. Due to unknown causes and size of lump recommended to scheduled with another provider.   Please advise if any available appt with Delene Ruffini , NP today .  Reason for Disposition  [1] Swelling is red AND [2] size > 2 inches (5.0 cm)  (Exception: Itchy area of skin.)  Answer Assessment - Initial Assessment Questions 1. APPEARANCE of SWELLING: "What does it look like?"     Long  shaped lump , red swollen ,approx 3 inches per patient's daughter in law 2. SIZE: "How large is the swelling?" (e.g., inches, cm; or compare to size of pinhead, tip of pen, eraser, coin, pea, grape, ping pong ball)      Approx 3 inches 3. LOCATION: "Where is the swelling located?"     Left forearm 4. ONSET: "When did the swelling start?"     Last night and getting bigger today  5. COLOR: "What color is it?" "Is there more than one color?"     reddish 6. PAIN: "Is there any pain?" If Yes, ask: "How bad is the pain?" (e.g., scale 1-10; or mild, moderate, severe)     - NONE (0): no pain   - MILD (1-3): doesn't interfere with normal activities    - MODERATE (4-7): interferes with normal activities or awakens from sleep    - SEVERE (8-10): excruciating pain, unable to do any normal activities     Bothering her but painful per daughter in law  7. ITCH: "Does  it itch?" If Yes, ask: "How bad is the itch?"      unknown 8. CAUSE: "What do you think caused the swelling?" 9 OTHER SYMPTOMS: "Do you have any other symptoms?" (e.g., fever)     No fever  Protocols used: Skin Lump or Localized Swelling-A-AH

## 2023-05-11 ENCOUNTER — Ambulatory Visit: Payer: Medicare HMO | Admitting: Nurse Practitioner

## 2023-05-11 DIAGNOSIS — F0393 Unspecified dementia, unspecified severity, with mood disturbance: Secondary | ICD-10-CM

## 2023-05-11 DIAGNOSIS — Z23 Encounter for immunization: Secondary | ICD-10-CM

## 2023-05-12 NOTE — Patient Instructions (Signed)
Be Involved in Caring For Your Health:  Taking Medications When medications are taken as directed, they can greatly improve your health. But if they are not taken as prescribed, they may not work. In some cases, not taking them correctly can be harmful. To help ensure your treatment remains effective and safe, understand your medications and how to take them. Bring your medications to each visit for review by your provider.  Your lab results, notes, and after visit summary will be available on My Chart. We strongly encourage you to use this feature. If lab results are abnormal the clinic will contact you with the appropriate steps. If the clinic does not contact you assume the results are satisfactory. You can always view your results on My Chart. If you have questions regarding your health or results, please contact the clinic during office hours. You can also ask questions on My Chart.  We at [facility] are grateful that you chose [place] to provide your care. We strive to provide evidence-based and compassionate care and are always looking for feedback. If you get a survey from the clinic please complete this so we can hear your opinions.  Fall Prevention in the Home, Adult Falls can cause injuries and can happen to people of all ages. There are many things you can do to make your home safer and to help prevent falls. What actions can I take to prevent falls? General information Use good lighting in all rooms. Make sure to: Replace any light bulbs that burn out. Turn on the lights in dark areas and use night-lights. Keep items that you use often in easy-to-reach places. Lower the shelves around your home if needed. Move furniture so that there are clear paths around it. Do not use throw rugs or other things on the floor that can make you trip. If any of your floors are uneven, fix them. Add color or contrast paint or tape to clearly mark and help you see: Grab bars or  handrails. First and last steps of staircases. Where the edge of each step is. If you use a ladder or stepladder: Make sure that it is fully opened. Do not climb a closed ladder. Make sure the sides of the ladder are locked in place. Have someone hold the ladder while you use it. Know where your pets are as you move through your home. What can I do in the bathroom?     Keep the floor dry. Clean up any water on the floor right away. Remove soap buildup in the bathtub or shower. Buildup makes bathtubs and showers slippery. Use non-skid mats or decals on the floor of the bathtub or shower. Attach bath mats securely with double-sided, non-slip rug tape. If you need to sit down in the shower, use a non-slip stool. Install grab bars by the toilet and in the bathtub and shower. Do not use towel bars as grab bars. What can I do in the bedroom? Make sure that you have a light by your bed that is easy to reach. Do not use any sheets or blankets on your bed that hang to the floor. Have a firm chair or bench with side arms that you can use for support when you get dressed. What can I do in the kitchen? Clean up any spills right away. If you need to reach something above you, use a step stool with a grab bar. Keep electrical cords out of the way. Do not use floor polish or wax  that makes floors slippery. What can I do with my stairs? Do not leave anything on the stairs. Make sure that you have a light switch at the top and the bottom of the stairs. Make sure that there are handrails on both sides of the stairs. Fix handrails that are broken or loose. Install non-slip stair treads on all your stairs if they do not have carpet. Avoid having throw rugs at the top or bottom of the stairs. Choose a carpet that does not hide the edge of the steps on the stairs. Make sure that the carpet is firmly attached to the stairs. Fix carpet that is loose or worn. What can I do on the outside of my home? Use  bright outdoor lighting. Fix the edges of walkways and driveways and fix any cracks. Clear paths of anything that can make you trip, such as tools or rocks. Add color or contrast paint or tape to clearly mark and help you see anything that might make you trip as you walk through a door, such as a raised step or threshold. Trim any bushes or trees on paths to your home. Check to see if handrails are loose or broken and that both sides of all steps have handrails. Install guardrails along the edges of any raised decks and porches. Have leaves, snow, or ice cleared regularly. Use sand, salt, or ice melter on paths if you live where there is ice and snow during the winter. Clean up any spills in your garage right away. This includes grease or oil spills. What other actions can I take? Review your medicines with your doctor. Some medicines can cause dizziness or changes in blood pressure, which increase your risk of falling. Wear shoes that: Have a low heel. Do not wear high heels. Have rubber bottoms and are closed at the toe. Feel good on your feet and fit well. Use tools that help you move around if needed. These include: Canes. Walkers. Scooters. Crutches. Ask your doctor what else you can do to help prevent falls. This may include seeing a physical therapist to learn to do exercises to move better and get stronger. Where to find more information Centers for Disease Control and Prevention, STEADI: TonerPromos.no [employer] on Aging: BaseRingTones.pl National Institute on Aging: BaseRingTones.pl Contact a doctor if: You are afraid of falling at home. You feel weak, drowsy, or dizzy at home. You fall at home. Get help right away if you: Lose consciousness or have trouble moving after a fall. Have a fall that causes a head injury. These symptoms may be an emergency. Get help right away. Call 911. Do not wait to see if the symptoms will go away. Do not drive yourself to the hospital. This  information is not intended to replace advice given to you by your health care provider. Make sure you discuss any questions you have with your health care provider. Document Revised: 04/10/2022 Document Reviewed: 04/10/2022 Elsevier Patient Education  2024 [employer].

## 2023-05-14 ENCOUNTER — Ambulatory Visit (INDEPENDENT_AMBULATORY_CARE_PROVIDER_SITE_OTHER): Payer: Medicare HMO | Admitting: Nurse Practitioner

## 2023-05-14 ENCOUNTER — Encounter: Payer: Self-pay | Admitting: Nurse Practitioner

## 2023-05-14 VITALS — BP 110/62 | HR 53 | Temp 97.9°F | Ht 60.9 in | Wt 133.2 lb

## 2023-05-14 DIAGNOSIS — F01A3 Vascular dementia, mild, with mood disturbance: Secondary | ICD-10-CM

## 2023-05-14 DIAGNOSIS — Z23 Encounter for immunization: Secondary | ICD-10-CM | POA: Diagnosis not present

## 2023-05-14 NOTE — Assessment & Plan Note (Signed)
Chronic, progressive -- is maintaining weight at this time.  MMSE 20/30 last visit and 6CIT 24 today.  MRI noting changes. Continue Aricept 10 MG as is tolerating.  Discussed with family.  Saw neurology in August for initial visit, continue this collaboration. Order for PT/OT to work on strength upper/lower and balance for fall prevention.  Continue to collaborate with family and patient at visits.  Recommend Glucerna shakes or protein supplements TID.

## 2023-05-14 NOTE — Progress Notes (Signed)
BP 110/62   Pulse (!) 53   Temp 97.9 F (36.6 C) (Oral)   Ht 5' 0.9" (1.547 m)   Wt 133 lb 3.2 oz (60.4 kg)   LMP 02/01/1991 (Approximate)   SpO2 96%   BMI 25.25 kg/m    Subjective:    Patient ID: Emily Watson, female    DOB: 10/29/1945, 77 y.o.   MRN: [id number]  HPI: Emily Watson is a 77 y.o. female  Chief Complaint  Patient presents with   Therapy    Patient states she is here to discuss possibly continuing OT/PT   MEMORY CHANGES: Initial diagnosis on 08/22/22 after MMSE testing and imaging -- MRI noting moderate bilateral hippocampal volume loss and tiny remote infarct in the right cerebellar hemisphere + mild chronic microvascular changes.  Taking Aricept 5 MG daily.  Middle sister had dementia later in life.  Has gained 2 pounds, is eating more protein.  Is eating three meals a day, son makes every meal -- these are not always what she wants to eat (low sugar, salt, and carbs).  Continues to use bathroom on own, does have some episodes incontinence urine, wearing briefs.  No recent falls or fractures. Is using cane more often for balance as family reports more unsteady gait and weakness. Would like to do home health PT/OT.   Saw neurology, Dr. Sherryll Burger, for initial visit on 04/05/23, no changes made.  Relevant past medical, surgical, family and social history reviewed and updated as indicated. Interim medical history since our last visit reviewed. Allergies and medications reviewed and updated.  Review of Systems  Constitutional:  Negative for activity change, appetite change, diaphoresis, fatigue and fever.  Respiratory:  Negative for cough, chest tightness and shortness of breath.   Cardiovascular:  Negative for chest pain, palpitations and leg swelling.  Gastrointestinal: Negative.   Neurological: Negative.   Psychiatric/Behavioral:  Negative for decreased concentration, self-injury, sleep disturbance and suicidal ideas. The patient is nervous/anxious.     Per HPI  unless specifically indicated above     Objective:    BP 110/62   Pulse (!) 53   Temp 97.9 F (36.6 C) (Oral)   Ht 5' 0.9" (1.547 m)   Wt 133 lb 3.2 oz (60.4 kg)   LMP 02/01/1991 (Approximate)   SpO2 96%   BMI 25.25 kg/m   Wt Readings from Last 3 Encounters:  05/14/23 133 lb 3.2 oz (60.4 kg)  05/09/23 131 lb 3.2 oz (59.5 kg)  02/21/23 131 lb (59.4 kg)    Physical Exam Vitals and nursing note reviewed.  Constitutional:      General: She is awake. She is not in acute distress.    Appearance: She is well-developed and well-groomed. She is not ill-appearing or toxic-appearing.  HENT:     Head: Normocephalic.     Right Ear: Hearing and external ear normal.     Left Ear: Hearing and external ear normal.  Eyes:     General: Lids are normal.        Right eye: No discharge.        Left eye: No discharge.     Conjunctiva/sclera: Conjunctivae normal.     Pupils: Pupils are equal, round, and reactive to light.  Neck:     Thyroid: No thyromegaly.     Vascular: No carotid bruit.  Cardiovascular:     Rate and Rhythm: Regular rhythm. Bradycardia present.     Heart sounds: Normal heart sounds. No murmur heard.    No  gallop.  Pulmonary:     Effort: Pulmonary effort is normal. No accessory muscle usage or respiratory distress.     Breath sounds: Normal breath sounds.  Abdominal:     General: Bowel sounds are normal.     Palpations: Abdomen is soft.  Musculoskeletal:     Cervical back: Normal range of motion and neck supple.     Right lower leg: 1+ Edema present.     Left lower leg: 1+ Edema present.  Lymphadenopathy:     Cervical: No cervical adenopathy.  Skin:    General: Skin is warm and dry.  Neurological:     Mental Status: She is alert.     Cranial Nerves: Cranial nerves 2-12 are intact.     Coordination: Coordination is intact.     Gait: Gait is intact.     Deep Tendon Reflexes: Reflexes are normal and symmetric.     Reflex Scores:      Brachioradialis reflexes are  2+ on the right side and 2+ on the left side.      Patellar reflexes are 2+ on the right side and 2+ on the left side.    Comments: Not oriented to place, person, time.  Pleasantly confused.  Psychiatric:        Attention and Perception: Attention normal.        Mood and Affect: Mood normal.        Speech: Speech normal.        Behavior: Behavior normal. Behavior is cooperative.        Thought Content: Thought content normal.        Cognition and Memory: Memory is impaired.     Results for orders placed or performed in visit on 02/21/23  Comprehensive metabolic panel  Result Value Ref Range   Glucose 79 70 - 99 mg/dL   BUN 14 8 - 27 mg/dL   Creatinine, Ser 3.24 0.57 - 1.00 mg/dL   eGFR 93 >40 NU/UVO/5.36   BUN/Creatinine Ratio 22 12 - 28   Sodium 143 134 - 144 mmol/L   Potassium 4.1 3.5 - 5.2 mmol/L   Chloride 104 96 - 106 mmol/L   CO2 26 20 - 29 mmol/L   Calcium 9.3 8.7 - 10.3 mg/dL   Total Protein 6.5 6.0 - 8.5 g/dL   Albumin 4.3 3.8 - 4.8 g/dL   Globulin, Total 2.2 1.5 - 4.5 g/dL   Bilirubin Total 1.0 0.0 - 1.2 mg/dL   Alkaline Phosphatase 37 (L) 44 - 121 IU/L   AST 19 0 - 40 IU/L   ALT 16 0 - 32 IU/L  Lipid Panel w/o Chol/HDL Ratio  Result Value Ref Range   Cholesterol, Total 169 100 - 199 mg/dL   Triglycerides 62 0 - 149 mg/dL   HDL 67 >64 mg/dL   VLDL Cholesterol Cal 12 5 - 40 mg/dL   LDL Chol Calc (NIH) 90 0 - 99 mg/dL  VITAMIN D 25 Hydroxy (Vit-D Deficiency, Fractures)  Result Value Ref Range   Vit D, 25-Hydroxy 74.7 30.0 - 100.0 ng/mL      Assessment & Plan:   Problem List Items Addressed This Visit       Nervous and Auditory   Dementia with mood disturbance (HCC) - Primary    Chronic, progressive -- is maintaining weight at this time.  MMSE 20/30 last visit and 6CIT 24 today.  MRI noting changes. Continue Aricept 10 MG as is tolerating.  Discussed with family.  Saw neurology in  August for initial visit, continue this collaboration. Order for PT/OT to work  on strength upper/lower and balance for fall prevention.  Continue to collaborate with family and patient at visits.  Recommend Glucerna shakes or protein supplements TID.       Relevant Orders   Ambulatory referral to Home Health   Other Visit Diagnoses     Need for influenza vaccination       Flu vaccine today, educated on this.   Relevant Orders   Flu Vaccine Trivalent High Dose (Fluad) (Completed)   Need for COVID-19 vaccine       Covid vaccine today, educated on this.   Relevant Orders   Pfizer Comirnaty Covid -19 Vaccine 3yrs and older (Completed)        Follow up plan: Return for Cancel 05/25/23 and schedule annual physical after 08/23/23.

## 2023-05-25 ENCOUNTER — Ambulatory Visit: Payer: Medicare HMO | Admitting: Nurse Practitioner

## 2023-06-19 ENCOUNTER — Encounter: Payer: Self-pay | Admitting: Nurse Practitioner

## 2023-07-14 ENCOUNTER — Other Ambulatory Visit: Payer: Self-pay | Admitting: Nurse Practitioner

## 2023-07-16 NOTE — Telephone Encounter (Signed)
Requested medications are due for refill today.  yes  Requested medications are on the active medications list.  yes  Last refill. 05/24/2022  #12 4 rf  Future visit scheduled.   yes  Notes to clinic.  Missing lab.    Requested Prescriptions  Pending Prescriptions Disp Refills   alendronate (FOSAMAX) 70 MG tablet [Pharmacy Med Name: Alendronate Sodium 70 MG Oral Tablet] 12 tablet 0    Sig: TAKE 1 TABLET BY MOUTH EVERY 7 DAYS.  TAKE WITH A FULL GLASS OF WATER ON AN EMPTY STOMACH     Endocrinology:  Bisphosphonates Failed - 07/14/2023 10:21 AM      Failed - Phosphate in normal range and within 360 days    No results found for: "PHOS"       Passed - Ca in normal range and within 360 days    Calcium  Date Value Ref Range Status  02/21/2023 9.3 8.7 - 10.3 mg/dL Final         Passed - Vitamin D in normal range and within 360 days    Vit D, 25-Hydroxy  Date Value Ref Range Status  02/21/2023 74.7 30.0 - 100.0 ng/mL Final    Comment:    Vitamin D deficiency has been defined by the Institute of Medicine and an Endocrine Society practice guideline as a level of serum 25-OH vitamin D less than 20 ng/mL (1,2). The Endocrine Society went on to further define vitamin D insufficiency as a level between 21 and 29 ng/mL (2). 1. IOM (Institute of Medicine). 2010. Dietary reference    intakes for calcium and D. [place]: The    [employer]. 2. Holick MF, Binkley [place], Bischoff-Ferrari HA, et al.    Evaluation, treatment, and prevention of vitamin D    deficiency: an Endocrine Society clinical practice    guideline. JCEM. 2011 Jul; 96(7):1911-30.          Passed - Cr in normal range and within 360 days    Creatinine, Ser  Date Value Ref Range Status  02/21/2023 0.63 0.57 - 1.00 mg/dL Final         Passed - Mg Level in normal range and within 360 days    Magnesium  Date Value Ref Range Status  08/22/2022 2.2 1.6 - 2.3 mg/dL Final         Passed - eGFR is 30 or  above and within 360 days    GFR calc Af Amer  Date Value Ref Range Status  06/09/2020 104 >59 mL/min/1.73 Final    Comment:    **In accordance with recommendations from the NKF-ASN Task force,**   [facility] is in the process of updating its eGFR calculation to the   2021 CKD-EPI creatinine equation that estimates kidney function   without a race variable.    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)   [facility] Nickerson, Corrie Dandy T, NP   2 months ago Cellulitis of upper extremity, unspecified laterality   [facility] Jackolyn Confer, MD   4 months ago Depression, major, single episode,  moderate (HCC)   [place] [facility] Pierce, Stanley T, NP   9 months ago Constipation, unspecified constipation type   [place] [facility] Mecum, Erin E, PA-C   10 months ago History of cerebral infarction   [place] [facility] [place], Dorie Rank, NP       Future Appointments             In 1 month Cannady, Dorie Rank, NP  [facility], [facility]            Passed - Bone Mineral Density or Dexa Scan completed in the last 2 years

## 2023-08-02 DIAGNOSIS — R32 Unspecified urinary incontinence: Secondary | ICD-10-CM | POA: Diagnosis not present

## 2023-08-02 DIAGNOSIS — F0393 Unspecified dementia, unspecified severity, with mood disturbance: Secondary | ICD-10-CM | POA: Diagnosis not present

## 2023-08-04 DIAGNOSIS — H538 Other visual disturbances: Secondary | ICD-10-CM | POA: Diagnosis not present

## 2023-08-04 DIAGNOSIS — F01A11 Vascular dementia, mild, with agitation: Secondary | ICD-10-CM | POA: Diagnosis not present

## 2023-08-04 DIAGNOSIS — F411 Generalized anxiety disorder: Secondary | ICD-10-CM | POA: Diagnosis not present

## 2023-08-04 DIAGNOSIS — F321 Major depressive disorder, single episode, moderate: Secondary | ICD-10-CM | POA: Diagnosis not present

## 2023-08-04 DIAGNOSIS — F01A4 Vascular dementia, mild, with anxiety: Secondary | ICD-10-CM | POA: Diagnosis not present

## 2023-08-04 DIAGNOSIS — F01A3 Vascular dementia, mild, with mood disturbance: Secondary | ICD-10-CM | POA: Diagnosis not present

## 2023-08-04 DIAGNOSIS — K219 Gastro-esophageal reflux disease without esophagitis: Secondary | ICD-10-CM | POA: Diagnosis not present

## 2023-08-04 DIAGNOSIS — H9191 Unspecified hearing loss, right ear: Secondary | ICD-10-CM | POA: Diagnosis not present

## 2023-08-04 DIAGNOSIS — M1990 Unspecified osteoarthritis, unspecified site: Secondary | ICD-10-CM | POA: Diagnosis not present

## 2023-08-06 ENCOUNTER — Telehealth: Payer: Self-pay | Admitting: Nurse Practitioner

## 2023-08-06 NOTE — Telephone Encounter (Signed)
Spoke with Glori Bickers and provided verbal OK for orders for the patient. Pam verbalized understanding and no further questions.

## 2023-08-06 NOTE — Telephone Encounter (Signed)
Home Health Verbal Orders - Caller/Agency: Carla Drape Number: [phone] Service Requested: Skilled Nursing Frequency:   Wants to continue, following for medication management and compliance  1w4 31month1 2 PRNs  Any new concerns about the patient? No

## 2023-08-09 DIAGNOSIS — F01A4 Vascular dementia, mild, with anxiety: Secondary | ICD-10-CM | POA: Diagnosis not present

## 2023-08-09 DIAGNOSIS — H9191 Unspecified hearing loss, right ear: Secondary | ICD-10-CM | POA: Diagnosis not present

## 2023-08-09 DIAGNOSIS — F411 Generalized anxiety disorder: Secondary | ICD-10-CM | POA: Diagnosis not present

## 2023-08-09 DIAGNOSIS — M1990 Unspecified osteoarthritis, unspecified site: Secondary | ICD-10-CM | POA: Diagnosis not present

## 2023-08-09 DIAGNOSIS — H538 Other visual disturbances: Secondary | ICD-10-CM | POA: Diagnosis not present

## 2023-08-09 DIAGNOSIS — K219 Gastro-esophageal reflux disease without esophagitis: Secondary | ICD-10-CM | POA: Diagnosis not present

## 2023-08-09 DIAGNOSIS — F321 Major depressive disorder, single episode, moderate: Secondary | ICD-10-CM | POA: Diagnosis not present

## 2023-08-09 DIAGNOSIS — F01A11 Vascular dementia, mild, with agitation: Secondary | ICD-10-CM | POA: Diagnosis not present

## 2023-08-09 DIAGNOSIS — F01A3 Vascular dementia, mild, with mood disturbance: Secondary | ICD-10-CM | POA: Diagnosis not present

## 2023-08-10 ENCOUNTER — Telehealth: Payer: Self-pay | Admitting: Nurse Practitioner

## 2023-08-10 DIAGNOSIS — F321 Major depressive disorder, single episode, moderate: Secondary | ICD-10-CM | POA: Diagnosis not present

## 2023-08-10 DIAGNOSIS — F01A3 Vascular dementia, mild, with mood disturbance: Secondary | ICD-10-CM | POA: Diagnosis not present

## 2023-08-10 DIAGNOSIS — F01A11 Vascular dementia, mild, with agitation: Secondary | ICD-10-CM | POA: Diagnosis not present

## 2023-08-10 DIAGNOSIS — M1990 Unspecified osteoarthritis, unspecified site: Secondary | ICD-10-CM | POA: Diagnosis not present

## 2023-08-10 DIAGNOSIS — K219 Gastro-esophageal reflux disease without esophagitis: Secondary | ICD-10-CM | POA: Diagnosis not present

## 2023-08-10 DIAGNOSIS — F411 Generalized anxiety disorder: Secondary | ICD-10-CM | POA: Diagnosis not present

## 2023-08-10 DIAGNOSIS — F01A4 Vascular dementia, mild, with anxiety: Secondary | ICD-10-CM | POA: Diagnosis not present

## 2023-08-10 DIAGNOSIS — H9191 Unspecified hearing loss, right ear: Secondary | ICD-10-CM | POA: Diagnosis not present

## 2023-08-10 DIAGNOSIS — H538 Other visual disturbances: Secondary | ICD-10-CM | POA: Diagnosis not present

## 2023-08-10 NOTE — Telephone Encounter (Signed)
Copied from CRM [id number]. Topic: Medicare AWV >> Aug 10, 2023  1:04 PM Payton Doughty wrote: Reason for CRM: Called LVM 08/10/2023 to schedule AWV. Please schedule office or virtual visits.  Verlee Rossetti; Care Guide Ambulatory Clinical Support [place] [facility] Direct Dial: [phone]

## 2023-08-13 ENCOUNTER — Telehealth: Payer: Self-pay | Admitting: Nurse Practitioner

## 2023-08-13 NOTE — Telephone Encounter (Signed)
Caller/Agency: Tiffany with [facility]  Callback Number: [phone] Service Requested: Physical Therapy Frequency: 1 x 7 starting next week Any new concerns about the patient? No

## 2023-08-16 NOTE — Telephone Encounter (Signed)
Called and gave verbal orders per Jolene.  

## 2023-08-20 DIAGNOSIS — K219 Gastro-esophageal reflux disease without esophagitis: Secondary | ICD-10-CM | POA: Diagnosis not present

## 2023-08-20 DIAGNOSIS — F01A4 Vascular dementia, mild, with anxiety: Secondary | ICD-10-CM | POA: Diagnosis not present

## 2023-08-20 DIAGNOSIS — M1990 Unspecified osteoarthritis, unspecified site: Secondary | ICD-10-CM | POA: Diagnosis not present

## 2023-08-20 DIAGNOSIS — H9191 Unspecified hearing loss, right ear: Secondary | ICD-10-CM | POA: Diagnosis not present

## 2023-08-20 DIAGNOSIS — H538 Other visual disturbances: Secondary | ICD-10-CM | POA: Diagnosis not present

## 2023-08-20 DIAGNOSIS — F321 Major depressive disorder, single episode, moderate: Secondary | ICD-10-CM | POA: Diagnosis not present

## 2023-08-20 DIAGNOSIS — F01A11 Vascular dementia, mild, with agitation: Secondary | ICD-10-CM | POA: Diagnosis not present

## 2023-08-20 DIAGNOSIS — F411 Generalized anxiety disorder: Secondary | ICD-10-CM | POA: Diagnosis not present

## 2023-08-20 DIAGNOSIS — F01A3 Vascular dementia, mild, with mood disturbance: Secondary | ICD-10-CM | POA: Diagnosis not present

## 2023-08-21 ENCOUNTER — Encounter: Payer: Self-pay | Admitting: Nurse Practitioner

## 2023-08-21 DIAGNOSIS — M1990 Unspecified osteoarthritis, unspecified site: Secondary | ICD-10-CM | POA: Diagnosis not present

## 2023-08-21 DIAGNOSIS — F01A11 Vascular dementia, mild, with agitation: Secondary | ICD-10-CM | POA: Diagnosis not present

## 2023-08-21 DIAGNOSIS — K219 Gastro-esophageal reflux disease without esophagitis: Secondary | ICD-10-CM | POA: Diagnosis not present

## 2023-08-21 DIAGNOSIS — F01A4 Vascular dementia, mild, with anxiety: Secondary | ICD-10-CM | POA: Diagnosis not present

## 2023-08-21 DIAGNOSIS — H9191 Unspecified hearing loss, right ear: Secondary | ICD-10-CM | POA: Diagnosis not present

## 2023-08-21 DIAGNOSIS — H538 Other visual disturbances: Secondary | ICD-10-CM | POA: Diagnosis not present

## 2023-08-21 DIAGNOSIS — F321 Major depressive disorder, single episode, moderate: Secondary | ICD-10-CM | POA: Diagnosis not present

## 2023-08-21 DIAGNOSIS — F01A3 Vascular dementia, mild, with mood disturbance: Secondary | ICD-10-CM | POA: Diagnosis not present

## 2023-08-21 DIAGNOSIS — F411 Generalized anxiety disorder: Secondary | ICD-10-CM | POA: Diagnosis not present

## 2023-08-27 DIAGNOSIS — M65312 Trigger thumb, left thumb: Secondary | ICD-10-CM

## 2023-08-27 DIAGNOSIS — F01A11 Vascular dementia, mild, with agitation: Secondary | ICD-10-CM | POA: Diagnosis not present

## 2023-08-27 DIAGNOSIS — I10 Essential (primary) hypertension: Secondary | ICD-10-CM

## 2023-08-27 DIAGNOSIS — H9191 Unspecified hearing loss, right ear: Secondary | ICD-10-CM | POA: Diagnosis not present

## 2023-08-27 DIAGNOSIS — K219 Gastro-esophageal reflux disease without esophagitis: Secondary | ICD-10-CM | POA: Diagnosis not present

## 2023-08-27 DIAGNOSIS — F01A4 Vascular dementia, mild, with anxiety: Secondary | ICD-10-CM | POA: Diagnosis not present

## 2023-08-27 DIAGNOSIS — M1990 Unspecified osteoarthritis, unspecified site: Secondary | ICD-10-CM | POA: Diagnosis not present

## 2023-08-27 DIAGNOSIS — H538 Other visual disturbances: Secondary | ICD-10-CM | POA: Diagnosis not present

## 2023-08-27 DIAGNOSIS — F411 Generalized anxiety disorder: Secondary | ICD-10-CM | POA: Diagnosis not present

## 2023-08-27 DIAGNOSIS — F01A3 Vascular dementia, mild, with mood disturbance: Secondary | ICD-10-CM | POA: Diagnosis not present

## 2023-08-27 DIAGNOSIS — M810 Age-related osteoporosis without current pathological fracture: Secondary | ICD-10-CM

## 2023-08-27 DIAGNOSIS — F321 Major depressive disorder, single episode, moderate: Secondary | ICD-10-CM | POA: Diagnosis not present

## 2023-08-29 DIAGNOSIS — H538 Other visual disturbances: Secondary | ICD-10-CM | POA: Diagnosis not present

## 2023-08-29 DIAGNOSIS — K219 Gastro-esophageal reflux disease without esophagitis: Secondary | ICD-10-CM | POA: Diagnosis not present

## 2023-08-29 DIAGNOSIS — F321 Major depressive disorder, single episode, moderate: Secondary | ICD-10-CM | POA: Diagnosis not present

## 2023-08-29 DIAGNOSIS — M1990 Unspecified osteoarthritis, unspecified site: Secondary | ICD-10-CM | POA: Diagnosis not present

## 2023-08-29 DIAGNOSIS — H9191 Unspecified hearing loss, right ear: Secondary | ICD-10-CM | POA: Diagnosis not present

## 2023-08-29 DIAGNOSIS — F411 Generalized anxiety disorder: Secondary | ICD-10-CM | POA: Diagnosis not present

## 2023-08-29 DIAGNOSIS — F01A3 Vascular dementia, mild, with mood disturbance: Secondary | ICD-10-CM | POA: Diagnosis not present

## 2023-08-29 DIAGNOSIS — F01A11 Vascular dementia, mild, with agitation: Secondary | ICD-10-CM | POA: Diagnosis not present

## 2023-08-29 DIAGNOSIS — F01A4 Vascular dementia, mild, with anxiety: Secondary | ICD-10-CM | POA: Diagnosis not present

## 2023-08-31 ENCOUNTER — Encounter: Payer: Medicare HMO | Admitting: Nurse Practitioner

## 2023-08-31 DIAGNOSIS — F5101 Primary insomnia: Secondary | ICD-10-CM

## 2023-08-31 DIAGNOSIS — Z0000 Encounter for general adult medical examination without abnormal findings: Secondary | ICD-10-CM

## 2023-08-31 DIAGNOSIS — I10 Essential (primary) hypertension: Secondary | ICD-10-CM

## 2023-08-31 DIAGNOSIS — K219 Gastro-esophageal reflux disease without esophagitis: Secondary | ICD-10-CM

## 2023-08-31 DIAGNOSIS — F0393 Unspecified dementia, unspecified severity, with mood disturbance: Secondary | ICD-10-CM

## 2023-08-31 DIAGNOSIS — E7800 Pure hypercholesterolemia, unspecified: Secondary | ICD-10-CM

## 2023-08-31 DIAGNOSIS — Z8673 Personal history of transient ischemic attack (TIA), and cerebral infarction without residual deficits: Secondary | ICD-10-CM

## 2023-08-31 DIAGNOSIS — M810 Age-related osteoporosis without current pathological fracture: Secondary | ICD-10-CM

## 2023-08-31 DIAGNOSIS — F321 Major depressive disorder, single episode, moderate: Secondary | ICD-10-CM

## 2023-09-03 DIAGNOSIS — F01A11 Vascular dementia, mild, with agitation: Secondary | ICD-10-CM | POA: Diagnosis not present

## 2023-09-03 DIAGNOSIS — M1990 Unspecified osteoarthritis, unspecified site: Secondary | ICD-10-CM | POA: Diagnosis not present

## 2023-09-03 DIAGNOSIS — F01A3 Vascular dementia, mild, with mood disturbance: Secondary | ICD-10-CM | POA: Diagnosis not present

## 2023-09-03 DIAGNOSIS — H538 Other visual disturbances: Secondary | ICD-10-CM | POA: Diagnosis not present

## 2023-09-03 DIAGNOSIS — F321 Major depressive disorder, single episode, moderate: Secondary | ICD-10-CM | POA: Diagnosis not present

## 2023-09-03 DIAGNOSIS — F01A4 Vascular dementia, mild, with anxiety: Secondary | ICD-10-CM | POA: Diagnosis not present

## 2023-09-03 DIAGNOSIS — F411 Generalized anxiety disorder: Secondary | ICD-10-CM | POA: Diagnosis not present

## 2023-09-03 DIAGNOSIS — H9191 Unspecified hearing loss, right ear: Secondary | ICD-10-CM | POA: Diagnosis not present

## 2023-09-03 DIAGNOSIS — K219 Gastro-esophageal reflux disease without esophagitis: Secondary | ICD-10-CM | POA: Diagnosis not present

## 2023-09-05 DIAGNOSIS — F01A3 Vascular dementia, mild, with mood disturbance: Secondary | ICD-10-CM | POA: Diagnosis not present

## 2023-09-05 DIAGNOSIS — H9191 Unspecified hearing loss, right ear: Secondary | ICD-10-CM | POA: Diagnosis not present

## 2023-09-05 DIAGNOSIS — K219 Gastro-esophageal reflux disease without esophagitis: Secondary | ICD-10-CM | POA: Diagnosis not present

## 2023-09-05 DIAGNOSIS — H538 Other visual disturbances: Secondary | ICD-10-CM | POA: Diagnosis not present

## 2023-09-05 DIAGNOSIS — F321 Major depressive disorder, single episode, moderate: Secondary | ICD-10-CM | POA: Diagnosis not present

## 2023-09-05 DIAGNOSIS — M1990 Unspecified osteoarthritis, unspecified site: Secondary | ICD-10-CM | POA: Diagnosis not present

## 2023-09-05 DIAGNOSIS — F01A4 Vascular dementia, mild, with anxiety: Secondary | ICD-10-CM | POA: Diagnosis not present

## 2023-09-05 DIAGNOSIS — F01A11 Vascular dementia, mild, with agitation: Secondary | ICD-10-CM | POA: Diagnosis not present

## 2023-09-05 DIAGNOSIS — F411 Generalized anxiety disorder: Secondary | ICD-10-CM | POA: Diagnosis not present

## 2023-09-07 DIAGNOSIS — F01A4 Vascular dementia, mild, with anxiety: Secondary | ICD-10-CM | POA: Diagnosis not present

## 2023-09-07 DIAGNOSIS — H9191 Unspecified hearing loss, right ear: Secondary | ICD-10-CM | POA: Diagnosis not present

## 2023-09-07 DIAGNOSIS — F321 Major depressive disorder, single episode, moderate: Secondary | ICD-10-CM | POA: Diagnosis not present

## 2023-09-07 DIAGNOSIS — F01A11 Vascular dementia, mild, with agitation: Secondary | ICD-10-CM | POA: Diagnosis not present

## 2023-09-07 DIAGNOSIS — F411 Generalized anxiety disorder: Secondary | ICD-10-CM | POA: Diagnosis not present

## 2023-09-07 DIAGNOSIS — K219 Gastro-esophageal reflux disease without esophagitis: Secondary | ICD-10-CM | POA: Diagnosis not present

## 2023-09-07 DIAGNOSIS — M1990 Unspecified osteoarthritis, unspecified site: Secondary | ICD-10-CM | POA: Diagnosis not present

## 2023-09-07 DIAGNOSIS — H538 Other visual disturbances: Secondary | ICD-10-CM | POA: Diagnosis not present

## 2023-09-07 DIAGNOSIS — F01A3 Vascular dementia, mild, with mood disturbance: Secondary | ICD-10-CM | POA: Diagnosis not present

## 2023-09-10 ENCOUNTER — Encounter: Payer: Medicare HMO | Admitting: Nurse Practitioner

## 2023-09-11 DIAGNOSIS — F01A4 Vascular dementia, mild, with anxiety: Secondary | ICD-10-CM | POA: Diagnosis not present

## 2023-09-11 DIAGNOSIS — H9191 Unspecified hearing loss, right ear: Secondary | ICD-10-CM | POA: Diagnosis not present

## 2023-09-11 DIAGNOSIS — M1990 Unspecified osteoarthritis, unspecified site: Secondary | ICD-10-CM | POA: Diagnosis not present

## 2023-09-11 DIAGNOSIS — F01A3 Vascular dementia, mild, with mood disturbance: Secondary | ICD-10-CM | POA: Diagnosis not present

## 2023-09-11 DIAGNOSIS — F411 Generalized anxiety disorder: Secondary | ICD-10-CM | POA: Diagnosis not present

## 2023-09-11 DIAGNOSIS — K219 Gastro-esophageal reflux disease without esophagitis: Secondary | ICD-10-CM | POA: Diagnosis not present

## 2023-09-11 DIAGNOSIS — F321 Major depressive disorder, single episode, moderate: Secondary | ICD-10-CM | POA: Diagnosis not present

## 2023-09-11 DIAGNOSIS — H538 Other visual disturbances: Secondary | ICD-10-CM | POA: Diagnosis not present

## 2023-09-11 DIAGNOSIS — F01A11 Vascular dementia, mild, with agitation: Secondary | ICD-10-CM | POA: Diagnosis not present

## 2023-09-12 DIAGNOSIS — F01A11 Vascular dementia, mild, with agitation: Secondary | ICD-10-CM | POA: Diagnosis not present

## 2023-09-12 DIAGNOSIS — F411 Generalized anxiety disorder: Secondary | ICD-10-CM | POA: Diagnosis not present

## 2023-09-12 DIAGNOSIS — H538 Other visual disturbances: Secondary | ICD-10-CM | POA: Diagnosis not present

## 2023-09-12 DIAGNOSIS — H9191 Unspecified hearing loss, right ear: Secondary | ICD-10-CM | POA: Diagnosis not present

## 2023-09-12 DIAGNOSIS — F01A4 Vascular dementia, mild, with anxiety: Secondary | ICD-10-CM | POA: Diagnosis not present

## 2023-09-12 DIAGNOSIS — F01A3 Vascular dementia, mild, with mood disturbance: Secondary | ICD-10-CM | POA: Diagnosis not present

## 2023-09-12 DIAGNOSIS — K219 Gastro-esophageal reflux disease without esophagitis: Secondary | ICD-10-CM | POA: Diagnosis not present

## 2023-09-12 DIAGNOSIS — M1990 Unspecified osteoarthritis, unspecified site: Secondary | ICD-10-CM | POA: Diagnosis not present

## 2023-09-12 DIAGNOSIS — F321 Major depressive disorder, single episode, moderate: Secondary | ICD-10-CM | POA: Diagnosis not present

## 2023-09-17 DIAGNOSIS — K219 Gastro-esophageal reflux disease without esophagitis: Secondary | ICD-10-CM | POA: Diagnosis not present

## 2023-09-17 DIAGNOSIS — F01A3 Vascular dementia, mild, with mood disturbance: Secondary | ICD-10-CM | POA: Diagnosis not present

## 2023-09-17 DIAGNOSIS — M1990 Unspecified osteoarthritis, unspecified site: Secondary | ICD-10-CM | POA: Diagnosis not present

## 2023-09-17 DIAGNOSIS — H538 Other visual disturbances: Secondary | ICD-10-CM | POA: Diagnosis not present

## 2023-09-17 DIAGNOSIS — F321 Major depressive disorder, single episode, moderate: Secondary | ICD-10-CM | POA: Diagnosis not present

## 2023-09-17 DIAGNOSIS — F01A4 Vascular dementia, mild, with anxiety: Secondary | ICD-10-CM | POA: Diagnosis not present

## 2023-09-17 DIAGNOSIS — F01A11 Vascular dementia, mild, with agitation: Secondary | ICD-10-CM | POA: Diagnosis not present

## 2023-09-17 DIAGNOSIS — F411 Generalized anxiety disorder: Secondary | ICD-10-CM | POA: Diagnosis not present

## 2023-09-17 DIAGNOSIS — H9191 Unspecified hearing loss, right ear: Secondary | ICD-10-CM | POA: Diagnosis not present

## 2023-09-19 DIAGNOSIS — F01A3 Vascular dementia, mild, with mood disturbance: Secondary | ICD-10-CM | POA: Diagnosis not present

## 2023-09-19 DIAGNOSIS — H538 Other visual disturbances: Secondary | ICD-10-CM | POA: Diagnosis not present

## 2023-09-19 DIAGNOSIS — F411 Generalized anxiety disorder: Secondary | ICD-10-CM | POA: Diagnosis not present

## 2023-09-19 DIAGNOSIS — F01A11 Vascular dementia, mild, with agitation: Secondary | ICD-10-CM | POA: Diagnosis not present

## 2023-09-19 DIAGNOSIS — H9191 Unspecified hearing loss, right ear: Secondary | ICD-10-CM | POA: Diagnosis not present

## 2023-09-19 DIAGNOSIS — F321 Major depressive disorder, single episode, moderate: Secondary | ICD-10-CM | POA: Diagnosis not present

## 2023-09-19 DIAGNOSIS — M1990 Unspecified osteoarthritis, unspecified site: Secondary | ICD-10-CM | POA: Diagnosis not present

## 2023-09-19 DIAGNOSIS — F01A4 Vascular dementia, mild, with anxiety: Secondary | ICD-10-CM | POA: Diagnosis not present

## 2023-09-19 DIAGNOSIS — K219 Gastro-esophageal reflux disease without esophagitis: Secondary | ICD-10-CM | POA: Diagnosis not present

## 2023-09-20 DIAGNOSIS — F01A3 Vascular dementia, mild, with mood disturbance: Secondary | ICD-10-CM | POA: Diagnosis not present

## 2023-09-20 DIAGNOSIS — F411 Generalized anxiety disorder: Secondary | ICD-10-CM | POA: Diagnosis not present

## 2023-09-20 DIAGNOSIS — K219 Gastro-esophageal reflux disease without esophagitis: Secondary | ICD-10-CM | POA: Diagnosis not present

## 2023-09-20 DIAGNOSIS — M1990 Unspecified osteoarthritis, unspecified site: Secondary | ICD-10-CM | POA: Diagnosis not present

## 2023-09-20 DIAGNOSIS — F01A4 Vascular dementia, mild, with anxiety: Secondary | ICD-10-CM | POA: Diagnosis not present

## 2023-09-20 DIAGNOSIS — F01A11 Vascular dementia, mild, with agitation: Secondary | ICD-10-CM | POA: Diagnosis not present

## 2023-09-20 DIAGNOSIS — F321 Major depressive disorder, single episode, moderate: Secondary | ICD-10-CM | POA: Diagnosis not present

## 2023-09-20 DIAGNOSIS — H9191 Unspecified hearing loss, right ear: Secondary | ICD-10-CM | POA: Diagnosis not present

## 2023-09-20 DIAGNOSIS — H538 Other visual disturbances: Secondary | ICD-10-CM | POA: Diagnosis not present

## 2023-09-24 DIAGNOSIS — F01A3 Vascular dementia, mild, with mood disturbance: Secondary | ICD-10-CM | POA: Diagnosis not present

## 2023-09-24 DIAGNOSIS — H538 Other visual disturbances: Secondary | ICD-10-CM | POA: Diagnosis not present

## 2023-09-24 DIAGNOSIS — K219 Gastro-esophageal reflux disease without esophagitis: Secondary | ICD-10-CM | POA: Diagnosis not present

## 2023-09-24 DIAGNOSIS — M1990 Unspecified osteoarthritis, unspecified site: Secondary | ICD-10-CM | POA: Diagnosis not present

## 2023-09-24 DIAGNOSIS — F411 Generalized anxiety disorder: Secondary | ICD-10-CM | POA: Diagnosis not present

## 2023-09-24 DIAGNOSIS — F01A4 Vascular dementia, mild, with anxiety: Secondary | ICD-10-CM | POA: Diagnosis not present

## 2023-09-24 DIAGNOSIS — H9191 Unspecified hearing loss, right ear: Secondary | ICD-10-CM | POA: Diagnosis not present

## 2023-09-24 DIAGNOSIS — F01A11 Vascular dementia, mild, with agitation: Secondary | ICD-10-CM | POA: Diagnosis not present

## 2023-09-24 DIAGNOSIS — F321 Major depressive disorder, single episode, moderate: Secondary | ICD-10-CM | POA: Diagnosis not present

## 2023-09-26 DIAGNOSIS — K219 Gastro-esophageal reflux disease without esophagitis: Secondary | ICD-10-CM | POA: Diagnosis not present

## 2023-09-26 DIAGNOSIS — H9191 Unspecified hearing loss, right ear: Secondary | ICD-10-CM | POA: Diagnosis not present

## 2023-09-26 DIAGNOSIS — F321 Major depressive disorder, single episode, moderate: Secondary | ICD-10-CM | POA: Diagnosis not present

## 2023-09-26 DIAGNOSIS — F01A3 Vascular dementia, mild, with mood disturbance: Secondary | ICD-10-CM | POA: Diagnosis not present

## 2023-09-26 DIAGNOSIS — F01A4 Vascular dementia, mild, with anxiety: Secondary | ICD-10-CM | POA: Diagnosis not present

## 2023-09-26 DIAGNOSIS — F01A11 Vascular dementia, mild, with agitation: Secondary | ICD-10-CM | POA: Diagnosis not present

## 2023-09-26 DIAGNOSIS — H538 Other visual disturbances: Secondary | ICD-10-CM | POA: Diagnosis not present

## 2023-09-26 DIAGNOSIS — F411 Generalized anxiety disorder: Secondary | ICD-10-CM | POA: Diagnosis not present

## 2023-09-26 DIAGNOSIS — M1990 Unspecified osteoarthritis, unspecified site: Secondary | ICD-10-CM | POA: Diagnosis not present

## 2023-10-01 DIAGNOSIS — H538 Other visual disturbances: Secondary | ICD-10-CM | POA: Diagnosis not present

## 2023-10-01 DIAGNOSIS — F411 Generalized anxiety disorder: Secondary | ICD-10-CM | POA: Diagnosis not present

## 2023-10-01 DIAGNOSIS — F01A11 Vascular dementia, mild, with agitation: Secondary | ICD-10-CM | POA: Diagnosis not present

## 2023-10-01 DIAGNOSIS — F01A4 Vascular dementia, mild, with anxiety: Secondary | ICD-10-CM | POA: Diagnosis not present

## 2023-10-01 DIAGNOSIS — K219 Gastro-esophageal reflux disease without esophagitis: Secondary | ICD-10-CM | POA: Diagnosis not present

## 2023-10-01 DIAGNOSIS — F321 Major depressive disorder, single episode, moderate: Secondary | ICD-10-CM | POA: Diagnosis not present

## 2023-10-01 DIAGNOSIS — F01A3 Vascular dementia, mild, with mood disturbance: Secondary | ICD-10-CM | POA: Diagnosis not present

## 2023-10-01 DIAGNOSIS — H9191 Unspecified hearing loss, right ear: Secondary | ICD-10-CM | POA: Diagnosis not present

## 2023-10-01 DIAGNOSIS — M1990 Unspecified osteoarthritis, unspecified site: Secondary | ICD-10-CM | POA: Diagnosis not present

## 2023-10-04 ENCOUNTER — Other Ambulatory Visit: Payer: Self-pay | Admitting: Nurse Practitioner

## 2023-10-04 NOTE — Telephone Encounter (Signed)
Requested Prescriptions  Pending Prescriptions Disp Refills   atorvastatin (LIPITOR) 10 MG tablet [Pharmacy Med Name: Atorvastatin Calcium 10 MG Oral Tablet] 90 tablet 0    Sig: TAKE 1 TABLET BY MOUTH ONCE DAILY AT 6 PM     Cardiovascular:  Antilipid - Statins Failed - 10/04/2023  4:55 PM      Failed - Lipid Panel in normal range within the last 12 months    Cholesterol, Total  Date Value Ref Range Status  02/21/2023 169 100 - 199 mg/dL Final   LDL Chol Calc (NIH)  Date Value Ref Range Status  02/21/2023 90 0 - 99 mg/dL Final   HDL  Date Value Ref Range Status  02/21/2023 67 >39 mg/dL Final   Triglycerides  Date Value Ref Range Status  02/21/2023 62 0 - 149 mg/dL Final         Passed - Patient is not pregnant      Passed - Valid encounter within last 12 months    Recent Outpatient Visits           4 months ago 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)   [place] [facility] [place], Corrie Dandy T, NP   4 months ago Cellulitis of upper extremity, unspecified laterality   [facility] Jackolyn Confer, MD   7 months ago Depression, major, single episode, moderate (HCC)   [facility] [facility] [place], Bethany T, NP   11 months ago Constipation, unspecified constipation type   [place] [facility] Mecum, Oswaldo Conroy, PA-C   1 year ago History of cerebral infarction   [place] [facility] [place], Dorie Rank, NP       Future Appointments             In 1 week Cannady, Dorie Rank, NP  [facility], [facility]   In 3 weeks MacDiarmid, Lorin Picket, MD [facility]

## 2023-10-07 NOTE — Patient Instructions (Signed)
 Be Involved in Caring For Your Health:  Taking Medications When medications are taken as directed, they can greatly improve your health. But if they are not taken as prescribed, they may not work. In some cases, not taking them correctly can be harmful. To help ensure your treatment remains effective and safe, understand your medications and how to take them. Bring your medications to each visit for review by your provider.  Your lab results, notes, and after visit summary will be available on My Chart. We strongly encourage you to use this feature. If lab results are abnormal the clinic will contact you with the appropriate steps. If the clinic does not contact you assume the results are satisfactory. You can always view your results on My Chart. If you have questions regarding your health or results, please contact the clinic during office hours. You can also ask questions on My Chart.  We at [facility] are grateful that you chose [place] to provide your care. We strive to provide evidence-based and compassionate care and are always looking for feedback. If you get a survey from the clinic please complete this so we can hear your opinions.  Managing Depression, Adult Depression is a mental health condition that affects your thoughts, feelings, and actions. Being diagnosed with depression can bring you relief if you did not know why you have felt or behaved a certain way. It could also leave you feeling overwhelmed. Finding ways to manage your symptoms can help you feel more positive about your future. How to manage lifestyle changes Being depressed is difficult. Depression can increase the level of everyday stress. Stress can make depression symptoms worse. You may believe your symptoms cannot be managed or will never improve. However, there are many things you can try to help manage your symptoms. There is hope. Managing stress  Stress is your body's reaction to life changes and events,  both good and bad. Stress can add to your feelings of depression. Learning to manage your stress can help lessen your feelings of depression. Try some of the following approaches to reducing your stress (stress reduction techniques): Listen to music that you enjoy and that inspires you. Try using a meditation app or take a meditation class. Develop a practice that helps you connect with your spiritual self. Walk in nature, pray, or go to a place of worship. Practice deep breathing. To do this, inhale slowly through your nose. Pause at the top of your inhale for a few seconds and then exhale slowly, letting yourself relax. Repeat this three or four times. Practice yoga to help relax and work your muscles. Choose a stress reduction technique that works for you. These techniques take time and practice to develop. Set aside 5-15 minutes a day to do them. Therapists can offer training in these techniques. Do these things to help manage stress: Keep a journal. Know your limits. Set healthy boundaries for yourself and others, such as saying "no" when you think something is too much. Pay attention to how you react to certain situations. You may not be able to control everything, but you can change your reaction. Add humor to your life by watching funny movies or shows. Make time for activities that you enjoy and that relax you. Spend less time using electronics, especially at night before bed. The light from screens can make your brain think it is time to get up rather than go to bed.  Medicines Medicines, such as antidepressants, are often a part of  treatment for depression. Talk with your pharmacist or health care provider about all the medicines, supplements, and herbal products that you take, their possible side effects, and what medicines and other products are safe to take together. Make sure to report any side effects you may have to your health care provider. Relationships Your health care  provider may suggest family therapy, couples therapy, or individual therapy as part of your treatment. How to recognize changes Everyone responds differently to treatment for depression. As you recover from depression, you may start to: Have more interest in doing activities. Feel more hopeful. Have more energy. Eat a more regular amount of food. Have better mental focus. It is important to recognize if your depression is not getting better or is getting worse. The symptoms you had in the beginning may return, such as: Feeling tired. Eating too much or too little. Sleeping too much or too little. Feeling restless, agitated, or hopeless. Trouble focusing or making decisions. Having unexplained aches and pains. Feeling irritable, angry, or aggressive. If you or your family members notice these symptoms coming back, let your health care provider know right away. Follow these instructions at home: Activity Try to get some form of exercise each day, such as walking. Try yoga, mindfulness, or other stress reduction techniques. Participate in group activities if you are able. Lifestyle Get enough sleep. Cut down on or stop using caffeine, tobacco, alcohol, and any other harmful substances. Eat a healthy diet that includes plenty of vegetables, fruits, whole grains, low-fat dairy products, and lean protein. Limit foods that are high in solid fats, added sugar, or salt (sodium). General instructions Take over-the-counter and prescription medicines only as told by your health care provider. Keep all follow-up visits. It is important for your health care provider to check on your mood, behavior, and medicines. Your health care provider may need to make changes to your treatment. Where to find support Talking to others  Friends and family members can be sources of support and guidance. Talk to trusted friends or family members about your condition. Explain your symptoms and let them know that you  are working with a health care provider to treat your depression. Tell friends and family how they can help. Finances Find mental health providers that fit with your financial situation. Talk with your health care provider if you are worried about access to food, housing, or medicine. Call your insurance company to learn about your co-pays and prescription plan. Where to find more information You can find support in your area from: Anxiety and Depression Association of [place] (ADAA): adaa.org Mental Health [place]: mentalhealthamerica.net [employer] on Mental Illness: nami.org Contact a health care provider if: You stop taking your antidepressant medicines, and you have any of these symptoms: Nausea. Headache. Light-headedness. Chills and body aches. Not being able to sleep (insomnia). You or your friends and family think your depression is getting worse. Get help right away if: You have thoughts of hurting yourself or others. Get help right away if you feel like you may hurt yourself or others, or have thoughts about taking your own life. Go to your nearest emergency room or: Call 911. Call the National Suicide Prevention Lifeline at 315-804-2514 or 988. This is open 24 hours a day. Text the Crisis Text Line at 902-341-0291. This information is not intended to replace advice given to you by your health care provider. Make sure you discuss any questions you have with your health care provider. Document Revised: 12/13/2021 Document Reviewed:  12/13/2021 [employer] Patient Education  2024 [employer].

## 2023-10-12 ENCOUNTER — Ambulatory Visit (INDEPENDENT_AMBULATORY_CARE_PROVIDER_SITE_OTHER): Payer: Medicare HMO | Admitting: Nurse Practitioner

## 2023-10-12 ENCOUNTER — Encounter: Payer: Self-pay | Admitting: Nurse Practitioner

## 2023-10-12 VITALS — BP 119/63 | HR 58 | Temp 97.5°F | Ht 60.9 in | Wt 138.4 lb

## 2023-10-12 DIAGNOSIS — F5101 Primary insomnia: Secondary | ICD-10-CM

## 2023-10-12 DIAGNOSIS — Z0000 Encounter for general adult medical examination without abnormal findings: Secondary | ICD-10-CM

## 2023-10-12 DIAGNOSIS — F321 Major depressive disorder, single episode, moderate: Secondary | ICD-10-CM

## 2023-10-12 DIAGNOSIS — F411 Generalized anxiety disorder: Secondary | ICD-10-CM | POA: Diagnosis not present

## 2023-10-12 DIAGNOSIS — E7800 Pure hypercholesterolemia, unspecified: Secondary | ICD-10-CM | POA: Diagnosis not present

## 2023-10-12 DIAGNOSIS — Z8673 Personal history of transient ischemic attack (TIA), and cerebral infarction without residual deficits: Secondary | ICD-10-CM

## 2023-10-12 DIAGNOSIS — M810 Age-related osteoporosis without current pathological fracture: Secondary | ICD-10-CM | POA: Diagnosis not present

## 2023-10-12 DIAGNOSIS — R609 Edema, unspecified: Secondary | ICD-10-CM | POA: Diagnosis not present

## 2023-10-12 DIAGNOSIS — I10 Essential (primary) hypertension: Secondary | ICD-10-CM | POA: Diagnosis not present

## 2023-10-12 DIAGNOSIS — K219 Gastro-esophageal reflux disease without esophagitis: Secondary | ICD-10-CM | POA: Diagnosis not present

## 2023-10-12 DIAGNOSIS — F01A3 Vascular dementia, mild, with mood disturbance: Secondary | ICD-10-CM | POA: Diagnosis not present

## 2023-10-12 MED ORDER — DONEPEZIL HCL 10 MG PO TABS: 10.0000 mg | ORAL_TABLET | Freq: Every day | ORAL | 4 refills | Status: AC

## 2023-10-12 MED ORDER — BUSPIRONE HCL 5 MG PO TABS: 5.0000 mg | ORAL_TABLET | Freq: Two times a day (BID) | ORAL | 4 refills | Status: AC

## 2023-10-12 MED ORDER — ALENDRONATE SODIUM 70 MG PO TABS: ORAL_TABLET | ORAL | 4 refills | Status: DC

## 2023-10-12 MED ORDER — ATORVASTATIN CALCIUM 10 MG PO TABS: ORAL_TABLET | ORAL | 4 refills | Status: DC

## 2023-10-12 NOTE — Progress Notes (Addendum)
 BP 119/63 (BP Location: Left Arm, Cuff Size: Normal)   Pulse (!) 58   Temp (!) 97.5 F (36.4 C) (Oral)   Ht 5' 0.9" (1.547 m)   Wt 138 lb 6.4 oz (62.8 kg)   LMP 02/01/1991 (Approximate)   SpO2 97%   BMI 26.24 kg/m    Subjective:    Patient ID: Emily Watson, female    DOB: 16-Dec-1945, 78 y.o.   MRN: [id number]  HPI: Darlen Gledhill is a 78 y.o. female presenting on 10/12/2023 for comprehensive medical examination and Medicare Wellness. Current medical complaints include:none  She currently lives with: self Menopausal Symptoms: no  Son present at bedside.  HYPERTENSION / HYPERLIPIDEMIA & DEMENTIA Continues to take Lipitor without ADR.  No HTN medications, focuses on diet.  No longer takes Omeprazole for GERD.  Takes Aricept for dementia.  Last saw neurology on 08/02/23 when PT was ordered.  Initially diagnosed on 08/22/22.  Imaging at time noted moderate bilateral hippocampal volume loss and tiny remote infarct in the right cerebellar hemisphere + mild chronic microvascular changes.  She had a sister who had dementia later in her life.  Is eating three meals a day, son makes every meal + she drinks protein shakes. Continues to use bathroom on own, does have some episodes incontinence urine, wearing briefs. No recent falls or fractures. Is using cane more often for balance as family reports more unsteady gait and weakness.  Satisfied with current treatment? yes Duration of hypertension: chronic BP monitoring frequency: not checking BP range:  Duration of hyperlipidemia: chronic Cholesterol medication side effects: no Cholesterol supplements: none Medication compliance: good compliance Aspirin: yes Recent stressors: no Recurrent headaches: no Visual changes: no Palpitations: no Dyspnea: no Chest pain: no Lower extremity edema: yes, more dependent Dizzy/lightheaded: no    OSTEOPOROSIS Last DEXA April 2023 noted T-score -2.8. Taking Fosamax weekly and supplements, Vit D and  Calcium. Satisfied with current treatment?: yes Medication side effects: no Medication compliance: good compliance Past osteoporosis medications/treatments:  Adequate calcium & vitamin D: yes Intolerance to bisphosphonates:no Weight bearing exercises: yes  DEPRESSION/ANXIETY Takes Buspar 5 MG twice a day. Mood status: stable Satisfied with current treatment?: yes Symptom severity: mild  Duration of current treatment : chronic Side effects: no Medication compliance: good compliance Psychotherapy/counseling: none Depressed mood: occasional Anxious mood: occasional Anhedonia: no Significant weight loss or gain: no Insomnia: none  Fatigue: no Feelings of worthlessness or guilt: no Impaired concentration/indecisiveness: no Suicidal ideations: no Hopelessness: no Crying spells: no    10/12/2023    8:26 AM 05/14/2023    4:30 PM 05/09/2023    2:36 PM 02/21/2023    4:49 PM 09/19/2022    2:08 PM  Depression screen PHQ 2/9  Decreased Interest 1 1 1 2 1   Down, Depressed, Hopeless 1 0 0 2 0  PHQ - 2 Score 2 1 1 4 1   Altered sleeping 0 0 2 0 0  Tired, decreased energy 1 2 0 3 0  Change in appetite 0 0 0 0 0  Feeling bad or failure about yourself  0 1 2 0 0  Trouble concentrating 0 1 2 3 1   Moving slowly or fidgety/restless 1 1 2 2 2   Suicidal thoughts 0 0 0 0 0  PHQ-9 Score 4 6 9 12 4   Difficult doing work/chores Somewhat difficult Very difficult Somewhat difficult Somewhat difficult Somewhat difficult      10/12/2023    8:26 AM 05/14/2023    4:31 PM 05/09/2023  2:36 PM 02/21/2023    4:49 PM  GAD 7 : Generalized Anxiety Score  Nervous, Anxious, on Edge 0 1 3 3   Control/stop worrying 0 0 1 3  Worry too much - different things 1 1 2 3   Trouble relaxing 0 1 0 2  Restless 0 0 1 0  Easily annoyed or irritable 1 1 3 3   Afraid - awful might happen 0 0 0 0  Total GAD 7 Score 2 4 10 14   Anxiety Difficulty Somewhat difficult Very difficult Somewhat difficult Somewhat difficult       08/22/2022    2:46 PM 08/22/2022    3:22 PM 05/09/2023    2:36 PM 05/14/2023    4:30 PM 10/12/2023    8:26 AM  Fall Risk  Falls in the past year? 0 0 0 0 0  Was there an injury with Fall? 0 0 0 0 0  Fall Risk Category Calculator 0 0 0 0 0  Fall Risk Category (Retired) Low Low     (RETIRED) Patient Fall Risk Level  Low fall risk     Patient at Risk for Falls Due to No Fall Risks No Fall Risks No Fall Risks No Fall Risks No Fall Risks  Fall risk Follow up Falls evaluation completed Falls prevention discussed Falls evaluation completed Falls evaluation completed Falls evaluation completed    Functional Status Survey: Is the patient deaf or have difficulty hearing?: Yes Does the patient have difficulty seeing, even when wearing glasses/contacts?: No Does the patient have difficulty concentrating, remembering, or making decisions?: Yes Does the patient have difficulty walking or climbing stairs?: Yes Does the patient have difficulty dressing or bathing?: No Does the patient have difficulty doing errands alone such as visiting a doctor's office or shopping?: Yes   Past Medical History:  Past Medical History:  Diagnosis Date   Allergy    Anaphylaxis    Anxiety    Arthritis    Carpal tunnel syndrome    Depression    GERD (gastroesophageal reflux disease)    Hypertension    patient denies   Peripheral edema    Trigger thumb of left hand     Surgical History:  Past Surgical History:  Procedure Laterality Date   CARPAL TUNNEL RELEASE     HERNIA REPAIR  04/2009   ROTATOR CUFF REPAIR Left    TOTAL KNEE ARTHROPLASTY Left 03/27/2017   Procedure: TOTAL KNEE ARTHROPLASTY - LEFT;  Surgeon: Kennedy Bucker, MD;  Location: [facility] ORS;  Service: Orthopedics;  Laterality: Left;    Medications:  Current Outpatient Medications on File Prior to Visit  Medication Sig   Cholecalciferol (VITAMIN D3 PO) Take by mouth daily. 2000 IU ( )   cyanocobalamin (VITAMIN B12) 1000 MCG tablet Take 1,000 mcg by  mouth daily.   MAGNESIUM GLYCINATE PO Take 500 mg by mouth daily.   lidocaine-prilocaine (EMLA) cream Apply 1 Application topically as needed. (Patient not taking: Reported on 10/12/2023)   No current facility-administered medications on file prior to visit.    Allergies:  Allergies  Allergen Reactions   Codeine Other (See Comments)    Sweating and passed out.   Chlorpheniramine-Phenylephrine Other (See Comments)    Skin reddened and hands peeled. She was told it was a chemical reaction between aspirin(gum) and Actifed.   Gabapentin Other (See Comments)    Constant stomach pains   Other Other (See Comments)    Macadamia nuts- lips go numb   Peppermint Oil Other (See Comments)  Difficulty breathing    Triprolidine-Pseudoephedrine Other (See Comments)    Skin reddened and hands peeled. She was told it was a chemical reaction between aspirin(gum) and Actifed.   Acetaminophen Rash    Social History:  Social History   Socioeconomic History   Marital status: Divorced    Spouse name: Not on file   Number of children: Not on file   Years of education: Not on file   Highest education level: High school graduate  Occupational History   Occupation: retired  Tobacco Use   Smoking status: Former    Current packs/day: 0.00    Types: Cigarettes    Quit date: 03/14/1997    Years since quitting: 26.5   Smokeless tobacco: Never  Vaping Use   Vaping status: Never Used  Substance and Sexual Activity   Alcohol use: No    Alcohol/week: 0.0 standard drinks of alcohol   Drug use: No   Sexual activity: Not Currently  Other Topics Concern   Not on file  Social History Narrative   Not on file   Social Drivers of Health   Financial Resource Strain: Low Risk  (07/04/2021)   Overall Financial Resource Strain (CARDIA)    Difficulty of Paying Living Expenses: Not hard at all  Food Insecurity: No Food Insecurity (07/04/2021)   Hunger Vital Sign    Worried About Running Out of Food in  the Last Year: Never true    Ran Out of Food in the Last Year: Never true  Transportation Needs: No Transportation Needs (07/04/2021)   PRAPARE - Administrator, Civil Service (Medical): No    Lack of Transportation (Non-Medical): No  Physical Activity: Inactive (07/04/2021)   Exercise Vital Sign    Days of Exercise per Week: 0 days    Minutes of Exercise per Session: 0 min  Stress: No Stress Concern Present (07/04/2021)   [employer] of Occupational Health - Occupational Stress Questionnaire    Feeling of Stress : Not at all  Social Connections: Socially Isolated (07/04/2021)   Social Connection and Isolation Panel [NHANES]    Frequency of Communication with Friends and Family: More than three times a week    Frequency of Social Gatherings with Friends and Family: More than three times a week    Attends Religious Services: Never    Database administrator or Organizations: No    Attends Banker Meetings: Never    Marital Status: Divorced  Catering manager Violence: Not At Risk (07/04/2021)   Humiliation, Afraid, Rape, and Kick questionnaire    Fear of Current or Ex-Partner: No    Emotionally Abused: No    Physically Abused: No    Sexually Abused: No   Social History   Tobacco Use  Smoking Status Former   Current packs/day: 0.00   Types: Cigarettes   Quit date: 03/14/1997   Years since quitting: 26.5  Smokeless Tobacco Never   Social History   Substance and Sexual Activity  Alcohol Use No   Alcohol/week: 0.0 standard drinks of alcohol    Family History:  Family History  Problem Relation Age of Onset   Kidney disease Mother    Cancer Mother    Kidney disease Father    Cancer Father    Cancer Sister        bone   Hypertension Sister    Osteoporosis Sister    Breast cancer Sister    Other Son 12       brain  tumor   Alzheimer's disease Sister    Hypertension Brother     Past medical history, surgical history, medications,  allergies, family history and social history reviewed with patient today and changes made to appropriate areas of the chart.   Review of Systems - negative All other ROS negative except what is listed above and in the HPI.      Objective:    BP 119/63 (BP Location: Left Arm, Cuff Size: Normal)   Pulse (!) 58   Temp (!) 97.5 F (36.4 C) (Oral)   Ht 5' 0.9" (1.547 m)   Wt 138 lb 6.4 oz (62.8 kg)   LMP 02/01/1991 (Approximate)   SpO2 97%   BMI 26.24 kg/m   Wt Readings from Last 3 Encounters:  10/12/23 138 lb 6.4 oz (62.8 kg)  05/14/23 133 lb 3.2 oz (60.4 kg)  05/09/23 131 lb 3.2 oz (59.5 kg)    Physical Exam Constitutional:      General: She is awake. She is not in acute distress.    Appearance: She is well-developed. She is not ill-appearing.  HENT:     Head: Normocephalic and atraumatic.     Right Ear: Hearing, tympanic membrane, ear canal and external ear normal. No drainage.     Left Ear: Hearing, tympanic membrane, ear canal and external ear normal. No drainage.     Nose: Nose normal.     Right Sinus: No maxillary sinus tenderness or frontal sinus tenderness.     Left Sinus: No maxillary sinus tenderness or frontal sinus tenderness.     Mouth/Throat:     Mouth: Mucous membranes are moist.     Pharynx: Oropharynx is clear. Uvula midline. No pharyngeal swelling, oropharyngeal exudate or posterior oropharyngeal erythema.  Eyes:     General: Lids are normal. No visual field deficit or scleral icterus.       Right eye: No discharge.        Left eye: No discharge.     Extraocular Movements: Extraocular movements intact.     Conjunctiva/sclera:     Right eye: Right conjunctiva is not injected. No exudate.    Left eye: Left conjunctiva is not injected. No exudate.    Pupils: Pupils are equal, round, and reactive to light.     Visual Fields: Right eye visual fields normal and left eye visual fields normal.  Neck:     Thyroid: No thyromegaly.     Vascular: No carotid bruit.      Trachea: Trachea normal.  Cardiovascular:     Rate and Rhythm: Regular rhythm. Bradycardia present.     Heart sounds: Normal heart sounds. No murmur heard.    No gallop.  Pulmonary:     Effort: Pulmonary effort is normal. No accessory muscle usage or respiratory distress.     Breath sounds: Normal breath sounds.  Chest:  Breasts:    Right: Normal.     Left: Normal.  Abdominal:     General: Bowel sounds are normal.     Palpations: Abdomen is soft. There is no hepatomegaly or splenomegaly.     Tenderness: There is no abdominal tenderness.  Musculoskeletal:        General: Normal range of motion.     Cervical back: Normal range of motion and neck supple.     Thoracic back: Scoliosis present.     Lumbar back: Scoliosis present.     Right lower leg: No edema.     Left lower leg: No edema.  Comments: Kyphosis noted.  Lymphadenopathy:     Head:     Right side of head: No submental, submandibular, tonsillar, preauricular or posterior auricular adenopathy.     Left side of head: No submental, submandibular, tonsillar, preauricular or posterior auricular adenopathy.     Cervical: No cervical adenopathy.     Upper Body:     Right upper body: No supraclavicular, axillary or pectoral adenopathy.     Left upper body: No supraclavicular, axillary or pectoral adenopathy.  Skin:    General: Skin is warm and dry.     Capillary Refill: Capillary refill takes less than 2 seconds.     Findings: No rash.  Neurological:     Mental Status: She is alert.     Gait: Gait is intact.     Deep Tendon Reflexes: Reflexes are normal and symmetric.     Reflex Scores:      Brachioradialis reflexes are 2+ on the right side and 2+ on the left side.      Patellar reflexes are 2+ on the right side and 2+ on the left side.    Comments: Oriented to provider, year and month.  Not oriented to date or day of week.  Psychiatric:        Attention and Perception: Attention normal.        Mood and Affect: Mood  normal.        Speech: Speech normal.        Behavior: Behavior normal. Behavior is cooperative.        Thought Content: Thought content normal.        Judgment: Judgment normal.       10/12/2023    8:19 AM 02/21/2023    4:45 PM 07/02/2020    9:21 AM 06/09/2020    9:50 AM 06/19/2018   11:53 AM  6CIT Screen  What Year? 4 points 4 points 0 points 0 points 0 points  What month? 3 points 3 points 0 points 0 points 0 points  What time? 3 points 3 points 0 points 0 points 0 points  Count back from 20 0 points 0 points 0 points 0 points 0 points  Months in reverse 4 points 4 points 0 points 2 points 0 points  Repeat phrase 10 points 10 points 2 points 10 points 0 points  Total Score 24 points 24 points 2 points 12 points 0 points      08/22/2022    3:37 PM 03/01/2021    4:01 PM  MMSE - Mini Mental State Exam  Orientation to time 3 5  Orientation to Place 2 4  Registration 3 3  Attention/ Calculation 4 5  Recall 0 1  Language- name 2 objects 2 2  Language- repeat 1 1  Language- follow 3 step command 3 3  Language- read & follow direction 1 1  Write a sentence 1 1  Copy design 0 1  Total score 20 27    Results for orders placed or performed in visit on 02/21/23  Comprehensive metabolic panel   Collection Time: 02/21/23  4:25 PM  Result Value Ref Range   Glucose 79 70 - 99 mg/dL   BUN 14 8 - 27 mg/dL   Creatinine, Ser 8.29 0.57 - 1.00 mg/dL   eGFR 93 >56 OZ/HYQ/6.57   BUN/Creatinine Ratio 22 12 - 28   Sodium 143 134 - 144 mmol/L   Potassium 4.1 3.5 - 5.2 mmol/L   Chloride 104 96 - 106 mmol/L  CO2 26 20 - 29 mmol/L   Calcium 9.3 8.7 - 10.3 mg/dL   Total Protein 6.5 6.0 - 8.5 g/dL   Albumin 4.3 3.8 - 4.8 g/dL   Globulin, Total 2.2 1.5 - 4.5 g/dL   Bilirubin Total 1.0 0.0 - 1.2 mg/dL   Alkaline Phosphatase 37 (L) 44 - 121 IU/L   AST 19 0 - 40 IU/L   ALT 16 0 - 32 IU/L  Lipid Panel w/o Chol/HDL Ratio   Collection Time: 02/21/23  4:25 PM  Result Value Ref Range    Cholesterol, Total 169 100 - 199 mg/dL   Triglycerides 62 0 - 149 mg/dL   HDL 67 >40 mg/dL   VLDL Cholesterol Cal 12 5 - 40 mg/dL   LDL Chol Calc (NIH) 90 0 - 99 mg/dL  VITAMIN D 25 Hydroxy (Vit-D Deficiency, Fractures)   Collection Time: 02/21/23  4:25 PM  Result Value Ref Range   Vit D, 25-Hydroxy 74.7 30.0 - 100.0 ng/mL      Assessment & Plan:   Problem List Items Addressed This Visit       Cardiovascular and Mediastinum   Hypertension   Chronic, diet-controlled, stable with BP at goal for her age.  Continue monitoring BP at home 3 mornings a week and documenting for provider.  Will monitor and initiate medications if consistently elevated.  LABS: CBC, TSH, CMP.  Focus on DASH diet.      Relevant Medications   atorvastatin (LIPITOR) 10 MG tablet   Other Relevant Orders   CBC with Differential/Platelet   Comprehensive metabolic panel   TSH     Digestive   GERD (gastroesophageal reflux disease)   Chronic, stable without medication.  Continue diet focus and take medication only as needed.        Nervous and Auditory   History of cerebral infarction   Noted on recent imaging 09/01/22.  Continue statin therapy.      Vascular dementia, mild, with mood disturbance (HCC)   Chronic, progressive -- is maintaining weight at this time.  MMSE 20/30 last visit and 6CIT 24 today.  MRI noting changes. Continue Aricept 10 MG as is tolerating.  Discussed with family. Follows with neurology, continue this collaboration. Order for PT/OT as needed.  Continue to collaborate with family and patient at visits.  Recommend Glucerna shakes or protein supplements TID.       Relevant Medications   busPIRone (BUSPAR) 5 MG tablet   donepezil (ARICEPT) 10 MG tablet     Musculoskeletal and Integument   Osteoporosis   Chronic, ongoing.  Noted DEXA last April 2023.  She is taking daily Calcium and Vit D3, to continue this.  Continue Fosamax (consider holiday in 2026), educated her at length on this.   Repeat DEXA in April 2025.  Monitor for fractures or falls.  Vit D level today.      Relevant Medications   alendronate (FOSAMAX) 70 MG tablet   Other Relevant Orders   VITAMIN D 25 Hydroxy (Vit-D Deficiency, Fractures)     Other   Dependent edema   Due to sitting with legs down in chair most of day.  Recommend use of compression hose on at home during day and off at night.  Elevated legs as much as possible.  Suspect some protein calorie malnutrition, check labs today and recommend protein shakes TID.      Depression, major, single episode, moderate (HCC)   Chronic, stable. Denies SI/HI.  Continue Buspar and adjust as needed.  Mood currently stable.      Relevant Medications   busPIRone (BUSPAR) 5 MG tablet   Generalized anxiety disorder   Chronic, stable.  Refer to depression plan of care.      Relevant Medications   busPIRone (BUSPAR) 5 MG tablet   Hypercholesteremia   Chronic, ongoing.  Lipid panel today. Continue Atorvastatin and adjust as needed.      Relevant Medications   atorvastatin (LIPITOR) 10 MG tablet   Other Relevant Orders   Comprehensive metabolic panel   Lipid Panel w/o Chol/HDL Ratio   Other Visit Diagnoses       Medicare annual wellness visit, subsequent    -  Primary   Medicare wellness due and performed today.     Encounter for annual physical exam       Annual physical today with labs and health maintenance reviewed, discussed with patient.        Follow up plan: Return in about 6 months (around 04/10/2024) for HTN/HLD, DEMENTIA, MOOD.   LABORATORY TESTING:  - Pap smear: not applicable  IMMUNIZATIONS:   - Tetanus vaccination status reviewed: Up To Date - Influenza: Up To Date - Pneumovax: Up to date - Prevnar: Up to date - HPV: Not applicable - Zostavax vaccine: Refused  SCREENING: -Mammogram: Up to date last 03/02/23 - Colonoscopy: Refused -- age 15 and wishes to discontinue this - Bone Density: Up to date  -Hearing Test: Not  applicable  -Spirometry: Not applicable   PATIENT COUNSELING:   Advised to take 1 mg of folate supplement per day if capable of pregnancy.   Sexuality: Discussed sexually transmitted diseases, partner selection, use of condoms, avoidance of unintended pregnancy  and contraceptive alternatives.   Advised to avoid cigarette smoking.  I discussed with the patient that most people either abstain from alcohol or drink within safe limits (<=14/week and <=4 drinks/occasion for males, <=7/weeks and <= 3 drinks/occasion for females) and that the risk for alcohol disorders and other health effects rises proportionally with the number of drinks per week and how often a drinker exceeds daily limits.  Discussed cessation/primary prevention of drug use and availability of treatment for abuse.   Diet: Encouraged to adjust caloric intake to maintain  or achieve ideal body weight, to reduce intake of dietary saturated fat and total fat, to limit sodium intake by avoiding high sodium foods and not adding table salt, and to maintain adequate dietary potassium and calcium preferably from fresh fruits, vegetables, and low-fat dairy products.    Stressed the importance of regular exercise  Injury prevention: Discussed safety belts, safety helmets, smoke detector, smoking near bedding or upholstery.   Dental health: Discussed importance of regular tooth brushing, flossing, and dental visits.    NEXT PREVENTATIVE PHYSICAL DUE IN 1 YEAR. Return in about 6 months (around 04/10/2024) for HTN/HLD, DEMENTIA, MOOD.

## 2023-10-12 NOTE — Assessment & Plan Note (Signed)
 Chronic, ongoing.  Noted DEXA last April 2023.  She is taking daily Calcium and Vit D3, to continue this.  Continue Fosamax (consider holiday in 2026), educated her at length on this.  Repeat DEXA in April 2025.  Monitor for fractures or falls.  Vit D level today.

## 2023-10-12 NOTE — Assessment & Plan Note (Signed)
 Chronic, diet-controlled, stable with BP at goal for her age.  Continue monitoring BP at home 3 mornings a week and documenting for provider.  Will monitor and initiate medications if consistently elevated.  LABS: CBC, TSH, CMP.  Focus on DASH diet.

## 2023-10-12 NOTE — Assessment & Plan Note (Signed)
 Chronic, stable without medication.  Continue diet focus and take medication only as needed.

## 2023-10-12 NOTE — Assessment & Plan Note (Addendum)
 Chronic, stable. Denies SI/HI.  Continue Buspar and adjust as needed.  Mood currently stable.

## 2023-10-12 NOTE — Assessment & Plan Note (Signed)
Due to sitting with legs down in chair most of day.  Recommend use of compression hose on at home during day and off at night.  Elevated legs as much as possible.  Suspect some protein calorie malnutrition, check labs today and recommend protein shakes TID.

## 2023-10-12 NOTE — Assessment & Plan Note (Signed)
 Chronic, ongoing.  Lipid panel today. Continue Atorvastatin and adjust as needed.

## 2023-10-12 NOTE — Assessment & Plan Note (Signed)
 Noted on recent imaging 09/01/22.  Continue statin therapy.

## 2023-10-12 NOTE — Assessment & Plan Note (Signed)
Chronic, stable. Refer to depression plan of care.

## 2023-10-12 NOTE — Assessment & Plan Note (Signed)
 Chronic, progressive -- is maintaining weight at this time.  MMSE 20/30 last visit and 6CIT 24 today.  MRI noting changes. Continue Aricept 10 MG as is tolerating.  Discussed with family. Follows with neurology, continue this collaboration. Order for PT/OT as needed.  Continue to collaborate with family and patient at visits.  Recommend Glucerna shakes or protein supplements TID.

## 2023-10-13 ENCOUNTER — Encounter: Payer: Self-pay | Admitting: Nurse Practitioner

## 2023-10-13 LAB — TSH: TSH: 1.69 u[IU]/mL (ref 0.450–4.500)

## 2023-10-13 LAB — CBC WITH DIFFERENTIAL/PLATELET
Basophils Absolute: 0 10*3/uL (ref 0.0–0.2)
Basos: 1 %
EOS (ABSOLUTE): 0.1 10*3/uL (ref 0.0–0.4)
Eos: 1 %
Hematocrit: 41.9 % (ref 34.0–46.6)
Hemoglobin: 14.1 g/dL (ref 11.1–15.9)
Immature Grans (Abs): 0 10*3/uL (ref 0.0–0.1)
Immature Granulocytes: 0 %
Lymphocytes Absolute: 1.6 10*3/uL (ref 0.7–3.1)
Lymphs: 28 %
MCH: 35.8 pg — ABNORMAL HIGH (ref 26.6–33.0)
MCHC: 33.7 g/dL (ref 31.5–35.7)
MCV: 106 fL — ABNORMAL HIGH (ref 79–97)
Monocytes Absolute: 0.4 10*3/uL (ref 0.1–0.9)
Monocytes: 7 %
Neutrophils Absolute: 3.6 10*3/uL (ref 1.4–7.0)
Neutrophils: 63 %
Platelets: 356 10*3/uL (ref 150–450)
RBC: 3.94 x10E6/uL (ref 3.77–5.28)
RDW: 12.8 % (ref 11.7–15.4)
WBC: 5.6 10*3/uL (ref 3.4–10.8)

## 2023-10-13 LAB — COMPREHENSIVE METABOLIC PANEL
ALT: 14 IU/L (ref 0–32)
AST: 21 IU/L (ref 0–40)
Albumin: 4.4 g/dL (ref 3.8–4.8)
Alkaline Phosphatase: 38 IU/L — ABNORMAL LOW (ref 44–121)
BUN/Creatinine Ratio: 38 — ABNORMAL HIGH (ref 12–28)
BUN: 23 mg/dL (ref 8–27)
Bilirubin Total: 1.2 mg/dL (ref 0.0–1.2)
CO2: 24 mmol/L (ref 20–29)
Calcium: 9.3 mg/dL (ref 8.7–10.3)
Chloride: 103 mmol/L (ref 96–106)
Creatinine, Ser: 0.61 mg/dL (ref 0.57–1.00)
Globulin, Total: 2.2 g/dL (ref 1.5–4.5)
Glucose: 89 mg/dL (ref 70–99)
Potassium: 4.5 mmol/L (ref 3.5–5.2)
Sodium: 143 mmol/L (ref 134–144)
Total Protein: 6.6 g/dL (ref 6.0–8.5)
eGFR: 93 mL/min/{1.73_m2} (ref >59)

## 2023-10-13 LAB — VITAMIN D 25 HYDROXY (VIT D DEFICIENCY, FRACTURES): Vit D, 25-Hydroxy: 60.2 ng/mL (ref 30.0–100.0)

## 2023-10-13 LAB — LIPID PANEL W/O CHOL/HDL RATIO
Cholesterol, Total: 154 mg/dL (ref 100–199)
HDL: 69 mg/dL (ref >39)
LDL Chol Calc (NIH): 74 mg/dL (ref 0–99)
Triglycerides: 53 mg/dL (ref 0–149)
VLDL Cholesterol Cal: 11 mg/dL (ref 5–40)

## 2023-10-13 NOTE — Progress Notes (Signed)
 Contacted via MyChart   Good evening Emily Watson and family, labs have returned: - CBC overall stable with no anemia or infection. - Kidney function, creatinine and eGFR, remains normal, as is liver function, AST and ALT.  - Remainder of labs stable, no medication changes needed.  Any questions? Keep being amazing!!  Thank you for allowing me to participate in your care.  I appreciate you. Kindest regards, Naysha Sholl

## 2023-10-29 ENCOUNTER — Ambulatory Visit: Payer: Self-pay | Admitting: Urology

## 2023-10-29 ENCOUNTER — Encounter: Payer: Self-pay | Admitting: Urology

## 2023-12-21 ENCOUNTER — Encounter: Payer: Self-pay | Admitting: Nurse Practitioner

## 2023-12-31 ENCOUNTER — Ambulatory Visit (INDEPENDENT_AMBULATORY_CARE_PROVIDER_SITE_OTHER): Admitting: Nurse Practitioner

## 2023-12-31 ENCOUNTER — Encounter: Payer: Self-pay | Admitting: Nurse Practitioner

## 2023-12-31 VITALS — BP 137/66 | HR 54 | Temp 98.4°F | Wt 138.2 lb

## 2023-12-31 DIAGNOSIS — E7800 Pure hypercholesterolemia, unspecified: Secondary | ICD-10-CM | POA: Diagnosis not present

## 2023-12-31 DIAGNOSIS — Z0289 Encounter for other administrative examinations: Secondary | ICD-10-CM

## 2023-12-31 DIAGNOSIS — W19XXXA Unspecified fall, initial encounter: Secondary | ICD-10-CM | POA: Diagnosis not present

## 2023-12-31 NOTE — Progress Notes (Signed)
 BP 137/66   Pulse (!) 54   Temp 98.4 F (36.9 C) (Oral)   Wt 138 lb 3.2 oz (62.7 kg)   LMP 02/01/1991 (Approximate)   SpO2 94%   BMI 26.20 kg/m    Subjective:    Patient ID: Emily Watson, female    DOB: 06-15-1946, 78 y.o.   MRN: [id number]  HPI: Emily Watson is a 78 y.o. female  Chief Complaint  Patient presents with   Fall    Pt fell a few days ago, pt son states a couple of days after the fall pt mentioned some soreness in the right breast area but haven't said anything else about the pain just wanted to follow up    Patient presents to clinic with her son.  She had a fall on Thursday-Friday time frame.  She was doing her exercises with her son and went to go to the bathroom and got caught on her feet.  States he was able to catch her and lower her to the ground.  She did have a bruise on her right knee but son has been icing it and it is feeling better.  A day or so after she was sore/achy in her chest but better today.  She didn't complain about it anymore this weekend but son wanted to make sure she was doing okay.     Relevant past medical, surgical, family and social history reviewed and updated as indicated. Interim medical history since our last visit reviewed. Allergies and medications reviewed and updated.  Review of Systems  Musculoskeletal:  Positive for arthralgias and myalgias.    Per HPI unless specifically indicated above     Objective:     BP 137/66   Pulse (!) 54   Temp 98.4 F (36.9 C) (Oral)   Wt 138 lb 3.2 oz (62.7 kg)   LMP 02/01/1991 (Approximate)   SpO2 94%   BMI 26.20 kg/m   Wt Readings from Last 3 Encounters:  12/31/23 138 lb 3.2 oz (62.7 kg)  10/12/23 138 lb 6.4 oz (62.8 kg)  05/14/23 133 lb 3.2 oz (60.4 kg)    Physical Exam Vitals and nursing note reviewed.  Constitutional:      General: She is not in acute distress.    Appearance: Normal appearance. She is normal weight. She is not ill-appearing, toxic-appearing or  diaphoretic.  HENT:     Head: Normocephalic.     Right Ear: External ear normal.     Left Ear: External ear normal.     Nose: Nose normal.     Mouth/Throat:     Mouth: Mucous membranes are moist.     Pharynx: Oropharynx is clear.  Eyes:     General:        Right eye: No discharge.        Left eye: No discharge.     Extraocular Movements: Extraocular movements intact.     Conjunctiva/sclera: Conjunctivae normal.     Pupils: Pupils are equal, round, and reactive to light.  Cardiovascular:     Rate and Rhythm: Normal rate and regular rhythm.     Heart sounds: No murmur heard. Pulmonary:     Effort: Pulmonary effort is normal. No respiratory distress.     Breath sounds: Normal breath sounds. No wheezing or rales.  Musculoskeletal:        General: No swelling, tenderness, deformity or signs of injury.     Cervical back: Normal range of motion and neck supple.  Right lower leg: No edema.     Left lower leg: No edema.  Skin:    General: Skin is warm and dry.     Capillary Refill: Capillary refill takes less than 2 seconds.  Neurological:     General: No focal deficit present.     Mental Status: She is alert and oriented to person, place, and time. Mental status is at baseline.  Psychiatric:        Mood and Affect: Mood normal.        Behavior: Behavior normal.        Thought Content: Thought content normal.        Judgment: Judgment normal.     Results for orders placed or performed in visit on 10/12/23  CBC with Differential/Platelet   Collection Time: 10/12/23  8:53 AM  Result Value Ref Range   WBC 5.6 3.4 - 10.8 x10E3/uL   RBC 3.94 3.77 - 5.28 x10E6/uL   Hemoglobin 14.1 11.1 - 15.9 g/dL   Hematocrit 96.2 95.2 - 46.6 %   MCV 106 (H) 79 - 97 fL   MCH 35.8 (H) 26.6 - 33.0 pg   MCHC 33.7 31.5 - 35.7 g/dL   RDW 84.1 32.4 - 40.1 %   Platelets 356 150 - 450 x10E3/uL   Neutrophils 63 Not Estab. %   Lymphs 28 Not Estab. %   Monocytes 7 Not Estab. %   Eos 1 Not Estab. %    Basos 1 Not Estab. %   Neutrophils Absolute 3.6 1.4 - 7.0 x10E3/uL   Lymphocytes Absolute 1.6 0.7 - 3.1 x10E3/uL   Monocytes Absolute 0.4 0.1 - 0.9 x10E3/uL   EOS (ABSOLUTE) 0.1 0.0 - 0.4 x10E3/uL   Basophils Absolute 0.0 0.0 - 0.2 x10E3/uL   Immature Granulocytes 0 Not Estab. %   Immature Grans (Abs) 0.0 0.0 - 0.1 x10E3/uL  Comprehensive metabolic panel   Collection Time: 10/12/23  8:53 AM  Result Value Ref Range   Glucose 89 70 - 99 mg/dL   BUN 23 8 - 27 mg/dL   Creatinine, Ser 0.27 0.57 - 1.00 mg/dL   eGFR 93 >25 DG/UYQ/0.34   BUN/Creatinine Ratio 38 (H) 12 - 28   Sodium 143 134 - 144 mmol/L   Potassium 4.5 3.5 - 5.2 mmol/L   Chloride 103 96 - 106 mmol/L   CO2 24 20 - 29 mmol/L   Calcium  9.3 8.7 - 10.3 mg/dL   Total Protein 6.6 6.0 - 8.5 g/dL   Albumin 4.4 3.8 - 4.8 g/dL   Globulin, Total 2.2 1.5 - 4.5 g/dL   Bilirubin Total 1.2 0.0 - 1.2 mg/dL   Alkaline Phosphatase 38 (L) 44 - 121 IU/L   AST 21 0 - 40 IU/L   ALT 14 0 - 32 IU/L  TSH   Collection Time: 10/12/23  8:53 AM  Result Value Ref Range   TSH 1.690 0.450 - 4.500 uIU/mL  Lipid Panel w/o Chol/HDL Ratio   Collection Time: 10/12/23  8:53 AM  Result Value Ref Range   Cholesterol, Total 154 100 - 199 mg/dL   Triglycerides 53 0 - 149 mg/dL   HDL 69 >74 mg/dL   VLDL Cholesterol Cal 11 5 - 40 mg/dL   LDL Chol Calc (NIH) 74 0 - 99 mg/dL  VITAMIN D  25 Hydroxy (Vit-D Deficiency, Fractures)   Collection Time: 10/12/23  8:53 AM  Result Value Ref Range   Vit D, 25-Hydroxy 60.2 30.0 - 100.0 ng/mL  Assessment & Plan:   Problem List Items Addressed This Visit       Other   Hypercholesteremia - Primary   Son plans to wean patient off the Atorvastatin .  Discussed risks and benefits of medication with patient and son during visit today.        Other Visit Diagnoses       Fall, initial encounter       Doing well since fall.  No residual pain complaints.     Encounter for completion of form with patient        Handicap placcard form completed for patient during visit today.        Follow up plan: No follow-ups on file.  A total of 30 minutes were spent on this encounter today.  When total time is documented, this includes both the face-to-face and non-face-to-face time personally spent before, during and after the visit on the date of the encounter discussing symptoms, plan of care and follow up with PCP.

## 2023-12-31 NOTE — Assessment & Plan Note (Signed)
 Son plans to wean patient off the Atorvastatin .  Discussed risks and benefits of medication with patient and son during visit today.

## 2024-04-06 NOTE — Patient Instructions (Incomplete)
 Please call to schedule your mammogram and/or bone density: [facility] at [facility]  Address: [address], [place], [place] [address] Phone: [phone]  [facility] [address] [place],  [place]  [address] Phone: [phone]    Be Involved in Caring For Your Health:  Taking Medications When medications are taken as directed, they can greatly improve your health. But if they are not taken as prescribed, they may not work. In some cases, not taking them correctly can be harmful. To help ensure your treatment remains effective and safe, understand your medications and how to take them. Bring your medications to each visit for review by your provider.  Your lab results, notes, and after visit summary will be available on My Chart. We strongly encourage you to use this feature. If lab results are abnormal the clinic will contact you with the appropriate steps. If the clinic does not contact you assume the results are satisfactory. You can always view your results on My Chart. If you have questions regarding your health or results, please contact the clinic during office hours. You can also ask questions on My Chart.  We at [facility] are grateful that you chose us  to provide your care. We strive to provide evidence-based and compassionate care and are always looking for feedback. If you get a survey from the clinic please complete this so we can hear your opinions.  Dementia Caregiver Guide Dementia is a condition that affects the way the brain works. It often affects thinking and memory. A person with dementia may: Forget things. Have trouble talking or responding to your questions. Have trouble paying attention. Have trouble thinking clearly and making good decisions. Get lost or wander away from home or other places. Have big changes in their mood or emotions. They may: Feel very worried, nervous, or  depressed. Have angry outbursts. Be suspicious or accuse you of things. Have childlike behavior and language. Taking care of someone with dementia can be a challenge. The tips below can help you care for the person. How to help manage lifestyle changes Dementia usually gets worse slowly over time. In the early stages, people with dementia can stay safe and take care of themselves with some help. In later stages, they need help with daily tasks like getting dressed, grooming, and going to the bathroom. Communicating When the person is talking and seems frustrated, make eye contact and hold the person's hand. Ask questions that can be answered with a yes or no. Use simple words and a calm voice. Only give one direction at a time. Limit choices for the person. Too many choices can be stressful. Avoid correcting the person in a negative way. If the person can't find the right words, gently try to help. Preventing injury  Keep floors clear. Remove rugs, magazine racks, and floor lamps. Keep hallways well lit, especially at night. Put a handrail and nonslip mat in the bathtub or shower. Put childproof locks on cabinets that have dangerous items in them. These items include medicine, alcohol, guns, cleaning products, and sharp tools. For doors to the outside, put locks where the person can't see or reach them. This helps keep the person from going out of the house and getting lost. Be ready for emergencies. Keep a list of emergency phone numbers and addresses close by. Remove car keys and lock garage doors so the person doesn't try to drive. Have the person wear a  bracelet that tracks where they are and shows that they're a person with memory loss. This should be worn at all times for safety. Helping with daily life  Keep the person on track with their daily routine. Try to identify areas where the person may need help. Be supportive, patient, calm, and encouraging. Gently remind the person  that adjusting to changes takes time. Help with the tasks that the person has asked for help with. Keep the person involved in daily tasks and decisions as much as you can. Encourage conversation, but try not to get frustrated if the person struggles to find words or doesn't seem to appreciate your help. Other tips Think about any safety risks and take steps to avoid them. Keep things organized: Organize medicines in a pill box for each day of the week. Keep a calendar in a central place. Use it to remind the person of health care visits or other activities. Create a plan to handle any legal or financial matters. Get help from a professional if needed. Help make sure the person: Takes medicines only as told by their health care providers. Eats regular, healthy meals. They should also drink plenty of fluids. Goes to all scheduled health care appointments. Gets regular sleep. Taking care of yourself Being a caregiver for someone with dementia can be hard. You may feel stressed and have many other emotions. It's important to also take care of yourself. Here are some tips: Find out about services that can provide short-term care for the person. This is called respite care. It can allow you to take a break when you need one. Find healthy ways to deal with stress. Some ways include: Spending time with other people. Exercising. Meditating or doing deep breathing exercises. Take care of your own health by: Getting enough sleep. Eating healthy foods. Getting regular exercise. Join a support group with others who are caregivers. These groups can help you: Learn other ways to deal with stress. Share experiences with others. Get emotional comfort and support. Learn about caregiving as the disease gets worse. Find resources in your community. Where to find support: Many people and organizations offer support. These include: Support groups for people with dementia. Support groups for  caregivers. Counselors or therapists. Home health care services. Adult day care centers. Where to find more information Alzheimer's Association: [employer] Family Caregiver Alliance: caregiver.org Alzheimer's Foundation of [place]: alzfdn.org Contact a health care provider if: The person's health is quickly getting worse. You're no longer able to care for the person. Caring for the person is affecting your physical and emotional health. You're feeling worried, nervous, or depressed about caring for the person. Get help right away if: You feel like the person may hurt themselves or others. The person has talked about taking their own life. These symptoms may be an emergency. Take one of these steps right away: Go to your nearest emergency room. Call 911. Call the National Suicide Prevention Lifeline at 7010663444 or 988. Text the Crisis Text Line at 614-757-3662. This information is not intended to replace advice given to you by your health care provider. Make sure you discuss any questions you have with your health care provider. Document Revised: 11/17/2022 Document Reviewed: 11/17/2022 Elsevier Patient Education  2024 [employer].

## 2024-04-11 ENCOUNTER — Ambulatory Visit (INDEPENDENT_AMBULATORY_CARE_PROVIDER_SITE_OTHER): Payer: Medicare HMO | Admitting: Nurse Practitioner

## 2024-04-11 ENCOUNTER — Encounter: Payer: Self-pay | Admitting: Nurse Practitioner

## 2024-04-11 VITALS — BP 103/60 | HR 56 | Temp 98.3°F | Ht 60.8 in | Wt 134.8 lb

## 2024-04-11 DIAGNOSIS — I10 Essential (primary) hypertension: Secondary | ICD-10-CM | POA: Diagnosis not present

## 2024-04-11 DIAGNOSIS — E7800 Pure hypercholesterolemia, unspecified: Secondary | ICD-10-CM | POA: Diagnosis not present

## 2024-04-11 DIAGNOSIS — M810 Age-related osteoporosis without current pathological fracture: Secondary | ICD-10-CM | POA: Diagnosis not present

## 2024-04-11 DIAGNOSIS — F5101 Primary insomnia: Secondary | ICD-10-CM | POA: Diagnosis not present

## 2024-04-11 DIAGNOSIS — F321 Major depressive disorder, single episode, moderate: Secondary | ICD-10-CM

## 2024-04-11 DIAGNOSIS — F411 Generalized anxiety disorder: Secondary | ICD-10-CM

## 2024-04-11 DIAGNOSIS — F01A3 Vascular dementia, mild, with mood disturbance: Secondary | ICD-10-CM

## 2024-04-11 NOTE — Assessment & Plan Note (Signed)
 Chronic, ongoing.  Lipid panel today. Son wishes to maintain patient off of statin at this time.

## 2024-04-11 NOTE — Progress Notes (Signed)
 BP 103/60   Pulse (!) 56   Temp 98.3 F (36.8 C) (Oral)   Ht 5' 0.8 (1.544 m)   Wt 134 lb 12.8 oz (61.1 kg)   LMP 02/01/1991 (Approximate)   SpO2 98%   BMI 25.64 kg/m    Subjective:    Patient ID: Emily Watson, female    DOB: 1946/06/14, 78 y.o.   MRN: [id number]  HPI: Emily Watson is a 78 y.o. female  Chief Complaint  Patient presents with   Depression   Dementia   Hyperlipidemia   Hypertension   Son and Dawna at bedside.  HYPERTENSION / HYPERLIPIDEMIA & DEMENTIA Currently no medications taken for HLD and HTN. Is now living with her son. Son wishes to maintain her off statin therapy.  Continues on Aricept  for dementia.  Neurology visit last on 08/02/23, no recent visits.  Diagnosed on 08/22/22.  Imaging on 08/31/22 noted moderate bilateral hippocampal volume loss and tiny remote infarct in the right cerebellar hemisphere + mild chronic microvascular changes.  Had a sister who had dementia later in her life.  Is eating three meals a day, son makes every meal + drinks protein shakes. Continues to use bathroom on own. Does have some episodes incontinence of urine, wearing briefs. No recent falls or fractures. Is having trouble getting out of her bed at night, concern for falls, they would like a hospital bed that can be adjusted for both mobility and edema. Family also needs a manual wheelchair to allow patient to get out more.  She currently has a walker, but gets tired easily and wants to sit.   Satisfied with current treatment? yes Duration of hypertension: chronic BP monitoring frequency: not checking BP range:  Duration of hyperlipidemia: chronic Cholesterol medication side effects: no Cholesterol supplements: none Medication compliance: good compliance Aspirin : yes Recent stressors: no Recurrent headaches: no Visual changes: no Palpitations: no Dyspnea: no Chest pain: no Lower extremity edema: yes, more dependent sitting a lot -- although this is  improving Dizzy/lightheaded: no    OSTEOPOROSIS DEXA April 2023 noted T-score -2.8. Takes Fosamax  weekly and supplements, Vit D and Calcium . Satisfied with current treatment?: yes Medication side effects: no Medication compliance: good compliance Past osteoporosis medications/treatments:  Adequate calcium  & vitamin D : yes Intolerance to bisphosphonates:no Weight bearing exercises: yes  DEPRESSION/ANXIETY Continues Buspar  5 MG twice a day. Mood status: stable Satisfied with current treatment?: yes Symptom severity: mild  Duration of current treatment : chronic Side effects: no Medication compliance: good compliance Psychotherapy/counseling: none Depressed mood: on occasion Anxious mood: on occasion Anhedonia: no Significant weight loss or gain: no Insomnia: none  Fatigue: no Feelings of worthlessness or guilt: no Impaired concentration/indecisiveness: no Suicidal ideations: no Hopelessness: no Crying spells: on occasion    04/11/2024    2:53 PM 12/31/2023    9:31 AM 10/12/2023    8:26 AM 05/14/2023    4:30 PM 05/09/2023    2:36 PM  Depression screen PHQ 2/9  Decreased Interest 1 1 1 1 1   Down, Depressed, Hopeless 1 1 1  0 0  PHQ - 2 Score 2 2 2 1 1   Altered sleeping 0 0 0 0 2  Tired, decreased energy 3 1 1 2  0  Change in appetite 0 0 0 0 0  Feeling bad or failure about yourself  0 0 0 1 2  Trouble concentrating 1 1 0 1 2  Moving slowly or fidgety/restless 1 1 1 1 2   Suicidal thoughts 0 0 0 0 0  PHQ-9 Score 7 5 4 6 9   Difficult doing work/chores Somewhat difficult Somewhat difficult Somewhat difficult Very difficult Somewhat difficult       04/11/2024    2:53 PM 12/31/2023    9:32 AM 10/12/2023    8:26 AM 05/14/2023    4:31 PM  GAD 7 : Generalized Anxiety Score  Nervous, Anxious, on Edge 1 1 0 1  Control/stop worrying 0 0 0 0  Worry too much - different things 1 1 1 1   Trouble relaxing 1 0 0 1  Restless 0 0 0 0  Easily annoyed or irritable 1 0 1 1  Afraid -  awful might happen 0 0 0 0  Total GAD 7 Score 4 2 2 4   Anxiety Difficulty Somewhat difficult Somewhat difficult Somewhat difficult Very difficult   Relevant past medical, surgical, family and social history reviewed and updated as indicated. Interim medical history since our last visit reviewed. Allergies and medications reviewed and updated.  Review of Systems  Constitutional:  Negative for activity change, appetite change, diaphoresis, fatigue and fever.  Respiratory:  Negative for cough, chest tightness and shortness of breath.   Cardiovascular:  Negative for chest pain, palpitations and leg swelling.  Gastrointestinal: Negative.   Neurological: Negative.   Psychiatric/Behavioral:  Negative for decreased concentration, self-injury, sleep disturbance and suicidal ideas. The patient is nervous/anxious.     Per HPI unless specifically indicated above     Objective:    BP 103/60   Pulse (!) 56   Temp 98.3 F (36.8 C) (Oral)   Ht 5' 0.8 (1.544 m)   Wt 134 lb 12.8 oz (61.1 kg)   LMP 02/01/1991 (Approximate)   SpO2 98%   BMI 25.64 kg/m   Wt Readings from Last 3 Encounters:  04/11/24 134 lb 12.8 oz (61.1 kg)  12/31/23 138 lb 3.2 oz (62.7 kg)  10/12/23 138 lb 6.4 oz (62.8 kg)    Physical Exam Vitals and nursing note reviewed.  Constitutional:      General: She is awake. She is not in acute distress.    Appearance: She is well-developed and well-groomed. She is not ill-appearing or toxic-appearing.  HENT:     Head: Normocephalic.     Right Ear: Hearing and external ear normal.     Left Ear: Hearing and external ear normal.  Eyes:     General: Lids are normal.        Right eye: No discharge.        Left eye: No discharge.     Conjunctiva/sclera: Conjunctivae normal.     Pupils: Pupils are equal, round, and reactive to light.  Neck:     Thyroid : No thyromegaly.     Vascular: No carotid bruit.  Cardiovascular:     Rate and Rhythm: Regular rhythm. Bradycardia present.      Heart sounds: Normal heart sounds. No murmur heard.    No gallop.  Pulmonary:     Effort: Pulmonary effort is normal. No accessory muscle usage or respiratory distress.     Breath sounds: Normal breath sounds.  Abdominal:     General: Bowel sounds are normal.     Palpations: Abdomen is soft.  Musculoskeletal:     Cervical back: Normal range of motion and neck supple.     Right lower leg: 1+ Edema present.     Left lower leg: 1+ Edema present.  Lymphadenopathy:     Cervical: No cervical adenopathy.  Skin:    General: Skin is warm  and dry.  Neurological:     Mental Status: She is alert.     Cranial Nerves: Cranial nerves 2-12 are intact.     Coordination: Coordination is intact.     Gait: Gait is intact.     Deep Tendon Reflexes: Reflexes are normal and symmetric.     Reflex Scores:      Brachioradialis reflexes are 2+ on the right side and 2+ on the left side.      Patellar reflexes are 2+ on the right side and 2+ on the left side.    Comments: Not oriented to place, person, time.  Pleasantly confused.  Psychiatric:        Attention and Perception: Attention normal.        Mood and Affect: Mood normal.        Speech: Speech normal.        Behavior: Behavior normal. Behavior is cooperative.        Thought Content: Thought content normal.        Cognition and Memory: Memory is impaired.    Results for orders placed or performed in visit on 10/12/23  CBC with Differential/Platelet   Collection Time: 10/12/23  8:53 AM  Result Value Ref Range   WBC 5.6 3.4 - 10.8 x10E3/uL   RBC 3.94 3.77 - 5.28 x10E6/uL   Hemoglobin 14.1 11.1 - 15.9 g/dL   Hematocrit 58.0 65.9 - 46.6 %   MCV 106 (H) 79 - 97 fL   MCH 35.8 (H) 26.6 - 33.0 pg   MCHC 33.7 31.5 - 35.7 g/dL   RDW 87.1 88.2 - 84.5 %   Platelets 356 150 - 450 x10E3/uL   Neutrophils 63 Not Estab. %   Lymphs 28 Not Estab. %   Monocytes 7 Not Estab. %   Eos 1 Not Estab. %   Basos 1 Not Estab. %   Neutrophils Absolute 3.6 1.4 -  7.0 x10E3/uL   Lymphocytes Absolute 1.6 0.7 - 3.1 x10E3/uL   Monocytes Absolute 0.4 0.1 - 0.9 x10E3/uL   EOS (ABSOLUTE) 0.1 0.0 - 0.4 x10E3/uL   Basophils Absolute 0.0 0.0 - 0.2 x10E3/uL   Immature Granulocytes 0 Not Estab. %   Immature Grans (Abs) 0.0 0.0 - 0.1 x10E3/uL  Comprehensive metabolic panel   Collection Time: 10/12/23  8:53 AM  Result Value Ref Range   Glucose 89 70 - 99 mg/dL   BUN 23 8 - 27 mg/dL   Creatinine, Ser 9.38 0.57 - 1.00 mg/dL   eGFR 93 >40 fO/fpw/8.26   BUN/Creatinine Ratio 38 (H) 12 - 28   Sodium 143 134 - 144 mmol/L   Potassium 4.5 3.5 - 5.2 mmol/L   Chloride 103 96 - 106 mmol/L   CO2 24 20 - 29 mmol/L   Calcium  9.3 8.7 - 10.3 mg/dL   Total Protein 6.6 6.0 - 8.5 g/dL   Albumin 4.4 3.8 - 4.8 g/dL   Globulin, Total 2.2 1.5 - 4.5 g/dL   Bilirubin Total 1.2 0.0 - 1.2 mg/dL   Alkaline Phosphatase 38 (L) 44 - 121 IU/L   AST 21 0 - 40 IU/L   ALT 14 0 - 32 IU/L  TSH   Collection Time: 10/12/23  8:53 AM  Result Value Ref Range   TSH 1.690 0.450 - 4.500 uIU/mL  Lipid Panel w/o Chol/HDL Ratio   Collection Time: 10/12/23  8:53 AM  Result Value Ref Range   Cholesterol, Total 154 100 - 199 mg/dL   Triglycerides 53 0 -  149 mg/dL   HDL 69 >60 mg/dL   VLDL Cholesterol Cal 11 5 - 40 mg/dL   LDL Chol Calc (NIH) 74 0 - 99 mg/dL  VITAMIN D  25 Hydroxy (Vit-D Deficiency, Fractures)   Collection Time: 10/12/23  8:53 AM  Result Value Ref Range   Vit D, 25-Hydroxy 60.2 30.0 - 100.0 ng/mL      Assessment & Plan:   Problem List Items Addressed This Visit       Cardiovascular and Mediastinum   Hypertension   Chronic, diet-controlled, stable with BP at goal for her age.  Continue monitoring BP at home 3 mornings a week and documenting for provider.  Will monitor and initiate medications if consistently elevated.  LABS: CMP.  Focus on DASH diet.        Nervous and Auditory   Vascular dementia, mild, with mood disturbance (HCC) - Primary   Chronic, progressive --  is maintaining weight at this time.  MMSE 20/30 last visit and 6CIT 24  MRI noting changes. Continue Aricept  10 MG as is tolerating.  Discussed with family. Follows with neurology, continue this collaboration. Order for PT/OT as needed.  Continue to collaborate with family and patient at visits.  Recommend Glucerna shakes or protein supplements TID.  - Will order hospital bed and manual wheelchair to help with fall prevention        Musculoskeletal and Integument   Osteoporosis   Chronic, ongoing.  Noted DEXA last April 2023.  She is taking daily Calcium  and Vit D3, to continue this.  Continue Fosamax  (consider holiday in 2026), educated her at length on this.  Repeat DEXA ordered and instructed family on how to schedule. Monitor for fractures or falls.        Relevant Orders   DG Bone Density     Other   Insomnia   Doing well without Trazodone  at this time, restart as needed.  Due to age will try to keep medication regimen <11 medications.      Hypercholesteremia   Chronic, ongoing.  Lipid panel today. Son wishes to maintain patient off of statin at this time.      Relevant Orders   Comprehensive metabolic panel with GFR   Lipid Panel w/o Chol/HDL Ratio   Generalized anxiety disorder   Chronic, stable.  Refer to depression plan of care.      Depression, major, single episode, moderate (HCC)   Chronic, stable. Denies SI/HI.  Continue Buspar  and adjust as needed.  Mood currently stable.        Follow up plan: Return in about 6 months (around 10/12/2024) for Annual Physical after 10/11/24.

## 2024-04-11 NOTE — Assessment & Plan Note (Signed)
Doing well without Trazodone at this time, restart as needed.  Due to age will try to keep medication regimen <11 medications. 

## 2024-04-11 NOTE — Assessment & Plan Note (Signed)
 Chronic, progressive -- is maintaining weight at this time.  MMSE 20/30 last visit and 6CIT 24  MRI noting changes. Continue Aricept  10 MG as is tolerating.  Discussed with family. Follows with neurology, continue this collaboration. Order for PT/OT as needed.  Continue to collaborate with family and patient at visits.  Recommend Glucerna shakes or protein supplements TID.  - Will order hospital bed and manual wheelchair to help with fall prevention

## 2024-04-11 NOTE — Assessment & Plan Note (Signed)
Chronic, diet-controlled, stable with BP at goal for her age.  Continue monitoring BP at home 3 mornings a week and documenting for provider.  Will monitor and initiate medications if consistently elevated.  LABS: CMP.  Focus on DASH diet.

## 2024-04-11 NOTE — Assessment & Plan Note (Signed)
 Chronic, ongoing.  Noted DEXA last April 2023.  She is taking daily Calcium  and Vit D3, to continue this.  Continue Fosamax  (consider holiday in 2026), educated her at length on this.  Repeat DEXA ordered and instructed family on how to schedule. Monitor for fractures or falls.

## 2024-04-11 NOTE — Assessment & Plan Note (Signed)
 Chronic, stable. Denies SI/HI.  Continue Buspar and adjust as needed.  Mood currently stable.

## 2024-04-11 NOTE — Assessment & Plan Note (Signed)
Chronic, stable. Refer to depression plan of care.

## 2024-04-12 ENCOUNTER — Ambulatory Visit: Payer: Self-pay | Admitting: Nurse Practitioner

## 2024-04-12 LAB — COMPREHENSIVE METABOLIC PANEL WITH GFR
ALT: 22 IU/L (ref 0–32)
AST: 26 IU/L (ref 0–40)
Albumin: 4.3 g/dL (ref 3.8–4.8)
Alkaline Phosphatase: 50 IU/L (ref 44–121)
BUN/Creatinine Ratio: 29 — ABNORMAL HIGH (ref 12–28)
BUN: 19 mg/dL (ref 8–27)
Bilirubin Total: 0.4 mg/dL (ref 0.0–1.2)
CO2: 22 mmol/L (ref 20–29)
Calcium: 9.2 mg/dL (ref 8.7–10.3)
Chloride: 105 mmol/L (ref 96–106)
Creatinine, Ser: 0.65 mg/dL (ref 0.57–1.00)
Globulin, Total: 2.2 g/dL (ref 1.5–4.5)
Glucose: 88 mg/dL (ref 70–99)
Potassium: 4.5 mmol/L (ref 3.5–5.2)
Sodium: 142 mmol/L (ref 134–144)
Total Protein: 6.5 g/dL (ref 6.0–8.5)
eGFR: 92 mL/min/1.73 (ref >59)

## 2024-04-12 LAB — LIPID PANEL W/O CHOL/HDL RATIO
Cholesterol, Total: 202 mg/dL — ABNORMAL HIGH (ref 100–199)
HDL: 67 mg/dL (ref >39)
LDL Chol Calc (NIH): 124 mg/dL — ABNORMAL HIGH (ref 0–99)
Triglycerides: 59 mg/dL (ref 0–149)
VLDL Cholesterol Cal: 11 mg/dL (ref 5–40)

## 2024-04-12 NOTE — Progress Notes (Signed)
 Contacted via MyChart  Good morning Kestrel and family, labs have returned: - Kidney function, creatinine and eGFR, remains normal, as is liver function, AST and ALT.  - Lipid panel is showing trends up without medication on board, but I know you prefer not to restart.  Continue focus on healthy diet and regular activity.  Any questions? Keep being amazing!!  Thank you for allowing me to participate in your care.  I appreciate you. Kindest regards, Verlene Glantz

## 2024-08-13 ENCOUNTER — Ambulatory Visit: Payer: Self-pay | Admitting: *Deleted

## 2024-08-13 NOTE — Telephone Encounter (Signed)
 FYI Only or Action Required?: FYI only for provider: Patient has been scheduled for 12/26- discussed care options before that- UC. Patient's son will decide.  Patient was last seen in primary care on 04/11/2024 by Cannady, Jolene T, NP.  Called Nurse Triage reporting Wheezing (cough).  Symptoms began a week ago.  Interventions attempted: Rest, hydration, or home remedies.  Symptoms are: gradually worsening.  Triage Disposition: See Physician Within 24 Hours  Patient/caregiver understands and will follow disposition?: unsure  Copied from CRM #[id number]. Topic: Clinical - Red Word Triage >> Aug 13, 2024  8:27 AM Olam RAMAN wrote: Red Word that prompted transfer to Nurse Triage: Pt has been wheezing and afraid it might be pneumonia, phlegm hard to cough up All day and can hear the wheezing at night Son on the line, Camellia Reason for Disposition  [1] Continuous (nonstop) coughing interferes with work or school AND [2] no improvement using cough treatment per Care Advice  Answer Assessment - Initial Assessment Questions Patient's son Camellia is calling with concerns about patient's cough- thick sputum- hard to get out and on/off wheezing. Patient is having some fatigue- SOB with exertion. Patient has been scheduled for Friday due to the holiday- advised UC for evaluation today  1. ONSET: When did the cough begin?      1 week- wheezing started of/on 2. SEVERITY: How bad is the cough today?      Periodically through the night/day 3. SPUTUM: Describe the color of your sputum (e.g., none, dry cough; clear, white, yellow, green)     Struggles to get sputum up 4. HEMOPTYSIS: Are you coughing up any blood? If Yes, ask: How much? (e.g., flecks, streaks, tablespoons, etc.)     Patient has not stated blood present 5. DIFFICULTY BREATHING: Are you having difficulty breathing? If Yes, ask: How bad is it? (e.g., mild, moderate, severe)      SOB with exertion 6. FEVER: Do you have a fever? If  Yes, ask: What is your temperature, how was it measured, and when did it start?     no 7. CARDIAC HISTORY: Do you have any history of heart disease? (e.g., heart attack, congestive heart failure)      hypertension 8. LUNG HISTORY: Do you have any history of lung disease?  (e.g., pulmonary embolus, asthma, emphysema)     no 9. PE RISK FACTORS: Do you have a history of blood clots? (or: recent major surgery, recent prolonged travel, bedridden)     no 10. OTHER SYMPTOMS: Do you have any other symptoms? (e.g., runny nose, wheezing, chest pain)       Wheezing at times  Protocols used: Cough - Acute Productive-A-AH

## 2024-08-15 ENCOUNTER — Ambulatory Visit (INDEPENDENT_AMBULATORY_CARE_PROVIDER_SITE_OTHER): Admitting: Nurse Practitioner

## 2024-08-15 ENCOUNTER — Encounter: Payer: Self-pay | Admitting: Nurse Practitioner

## 2024-08-15 VITALS — BP 132/68 | HR 60 | Temp 97.8°F | Resp 17 | Ht 60.79 in | Wt 136.2 lb

## 2024-08-15 DIAGNOSIS — J40 Bronchitis, not specified as acute or chronic: Secondary | ICD-10-CM | POA: Insufficient documentation

## 2024-08-15 MED ORDER — AMOXICILLIN-POT CLAVULANATE 875-125 MG PO TABS: 1.0000 | ORAL_TABLET | Freq: Two times a day (BID) | ORAL | 0 refills | Status: AC

## 2024-08-15 NOTE — Progress Notes (Signed)
 "  BP 132/68 (BP Location: Left Arm, Patient Position: Sitting, Cuff Size: Normal)   Pulse 60   Temp 97.8 F (36.6 C) (Oral)   Resp 17   Ht 5' 0.79 (1.544 m)   Wt 136 lb 3.2 oz (61.8 kg)   LMP 02/01/1991   SpO2 94%   BMI 25.92 kg/m    Subjective:    Patient ID: Emily Watson, female    DOB: 1946-07-18, 78 y.o.   MRN: [id number]  HPI: Emily Watson is a 78 y.o. female  Chief Complaint  Patient presents with   Cough    Son states its mostly phlegm but wants to make sure its not fluid on the lungs   Son at bedside to assist with HPI.  UPPER RESPIRATORY TRACT INFECTION Present for past week, more during middle of the night. Fever: no Cough: yes Shortness of breath: no Wheezing: yes occasional Chest pain: no Chest tightness: no Chest congestion: yes Nasal congestion: yes Runny nose: yes Post nasal drip: yes Sneezing: no Sore throat: no Swollen glands: no Sinus pressure: no Headache: no Face pain: no Toothache: no Ear pain: none Ear pressure: none Eyes red/itching:no Eye drainage/crusting: no  Vomiting: no Rash: no Fatigue: no Sick contacts: no Strep contacts: no  Context: stable Recurrent sinusitis: no Relief with OTC cold/cough medications: no  Treatments attempted: none taken    Relevant past medical, surgical, family and social history reviewed and updated as indicated. Interim medical history since our last visit reviewed. Allergies and medications reviewed and updated.  Review of Systems  Constitutional:  Negative for activity change, appetite change, chills, fatigue and fever.  HENT:  Positive for congestion, postnasal drip and rhinorrhea. Negative for ear discharge, ear pain, facial swelling, sinus pressure, sinus pain, sneezing, sore throat and voice change.   Respiratory:  Positive for cough and wheezing. Negative for chest tightness and shortness of breath.   Cardiovascular:  Negative for chest pain, palpitations and leg swelling.   Gastrointestinal: Negative.   Musculoskeletal:  Positive for myalgias.  Neurological: Negative.   Psychiatric/Behavioral: Negative.     Per HPI unless specifically indicated above     Objective:    BP 132/68 (BP Location: Left Arm, Patient Position: Sitting, Cuff Size: Normal)   Pulse 60   Temp 97.8 F (36.6 C) (Oral)   Resp 17   Ht 5' 0.79 (1.544 m)   Wt 136 lb 3.2 oz (61.8 kg)   LMP 02/01/1991   SpO2 94%   BMI 25.92 kg/m   Wt Readings from Last 3 Encounters:  08/15/24 136 lb 3.2 oz (61.8 kg)  04/11/24 134 lb 12.8 oz (61.1 kg)  12/31/23 138 lb 3.2 oz (62.7 kg)    Physical Exam Vitals and nursing note reviewed.  Constitutional:      General: She is awake. She is not in acute distress.    Appearance: She is well-developed and well-groomed. She is not ill-appearing or toxic-appearing.  HENT:     Head: Normocephalic.     Right Ear: Hearing, ear canal and external ear normal. A middle ear effusion is present. Tympanic membrane is not injected or perforated.     Left Ear: Hearing, ear canal and external ear normal. A middle ear effusion is present. Tympanic membrane is not injected or perforated.     Nose: Rhinorrhea present. Rhinorrhea is clear.     Right Sinus: No maxillary sinus tenderness or frontal sinus tenderness.     Left Sinus: No maxillary sinus tenderness or frontal  sinus tenderness.     Mouth/Throat:     Mouth: Mucous membranes are moist.     Pharynx: Posterior oropharyngeal erythema (mild) and postnasal drip present. No pharyngeal swelling or oropharyngeal exudate.  Eyes:     General: Lids are normal.        Right eye: No discharge.        Left eye: No discharge.     Conjunctiva/sclera: Conjunctivae normal.     Pupils: Pupils are equal, round, and reactive to light.  Neck:     Thyroid : No thyromegaly.     Vascular: No carotid bruit.  Cardiovascular:     Rate and Rhythm: Normal rate and regular rhythm.     Heart sounds: Normal heart sounds. No murmur  heard.    No gallop.  Pulmonary:     Effort: Pulmonary effort is normal. No accessory muscle usage or respiratory distress.     Breath sounds: Wheezing (a few scattered expiratory wheezes noted throughout) present. No decreased breath sounds or rales.  Abdominal:     General: Bowel sounds are normal.     Palpations: Abdomen is soft. There is no hepatomegaly or splenomegaly.  Musculoskeletal:     Cervical back: Normal range of motion and neck supple.     Right lower leg: No edema.     Left lower leg: No edema.  Lymphadenopathy:     Head:     Right side of head: No submental, submandibular, tonsillar, preauricular or posterior auricular adenopathy.     Left side of head: No submental, submandibular, tonsillar, preauricular or posterior auricular adenopathy.     Cervical: No cervical adenopathy.  Skin:    General: Skin is warm and dry.  Neurological:     Mental Status: She is alert and oriented to person, place, and time.  Psychiatric:        Attention and Perception: Attention normal.        Mood and Affect: Mood normal.        Speech: Speech normal.        Behavior: Behavior normal. Behavior is cooperative.        Thought Content: Thought content normal.     Results for orders placed or performed in visit on 04/11/24  Comprehensive metabolic panel with GFR   Collection Time: 04/11/24  3:21 PM  Result Value Ref Range   Glucose 88 70 - 99 mg/dL   BUN 19 8 - 27 mg/dL   Creatinine, Ser 9.34 0.57 - 1.00 mg/dL   eGFR 92 >40 fO/fpw/8.26   BUN/Creatinine Ratio 29 (H) 12 - 28   Sodium 142 134 - 144 mmol/L   Potassium 4.5 3.5 - 5.2 mmol/L   Chloride 105 96 - 106 mmol/L   CO2 22 20 - 29 mmol/L   Calcium  9.2 8.7 - 10.3 mg/dL   Total Protein 6.5 6.0 - 8.5 g/dL   Albumin 4.3 3.8 - 4.8 g/dL   Globulin, Total 2.2 1.5 - 4.5 g/dL   Bilirubin Total 0.4 0.0 - 1.2 mg/dL   Alkaline Phosphatase 50 44 - 121 IU/L   AST 26 0 - 40 IU/L   ALT 22 0 - 32 IU/L  Lipid Panel w/o Chol/HDL Ratio    Collection Time: 04/11/24  3:21 PM  Result Value Ref Range   Cholesterol, Total 202 (H) 100 - 199 mg/dL   Triglycerides 59 0 - 149 mg/dL   HDL 67 >60 mg/dL   VLDL Cholesterol Cal 11 5 - 40 mg/dL  LDL Chol Calc (NIH) 124 (H) 0 - 99 mg/dL      Assessment & Plan:   Problem List Items Addressed This Visit       Respiratory   Bronchitis - Primary   With cough persisting for over one week. Due to age and risk for PNA will start Augmentin  BID for 7 days. Educated her son on this. To take with probiotic yogurt to help with GI side effects. No red flags noted on exam. Recommend: - Increased rest - Increasing Fluids - Acetaminophen  as needed for fever/pain.  - Salt water gargling,chloraseptic spray and throat lozenges - OTC Coricidin - Mucinex.  - Saline sinus flushes or a neti pot.  - Humidifying the air.         Follow up plan: Return for as scheduled in February for follow-up.      "

## 2024-08-15 NOTE — Assessment & Plan Note (Signed)
 With cough persisting for over one week. Due to age and risk for PNA will start Augmentin  BID for 7 days. Educated her son on this. To take with probiotic yogurt to help with GI side effects. No red flags noted on exam. Recommend: - Increased rest - Increasing Fluids - Acetaminophen  as needed for fever/pain.  - Salt water gargling,chloraseptic spray and throat lozenges - OTC Coricidin - Mucinex.  - Saline sinus flushes or a neti pot.  - Humidifying the air.

## 2024-08-15 NOTE — Patient Instructions (Signed)
 Please call to schedule your mammogram and/or bone density: [facility] at [facility]  Address: [address], [place], [place] [address] Phone: [phone]  [facility] [address] [place],  [place]  [address] Phone: [phone]    Cough, Adult A cough helps to clear your throat and lungs. It may be a sign of an illness or another condition. A short-term (acute) cough may last 2-3 weeks. A long-term (chronic) cough may last 8 or more weeks. Many things can cause a cough. They include: Illnesses such as: An infection in your throat or lungs. Asthma or other heart or lung problems. Gastroesophageal reflux. This is when acid comes back up from your stomach. Breathing in things that bother (irritate) your lungs. Allergies. Postnasal drip. This is when mucus runs down the back of your throat. Smoking. Some medicines. Follow these instructions at home: Medicines Take over-the-counter and prescription medicines only as told by your doctor. Talk with your doctor before you take cough medicine (cough suppressants). Eating and drinking Do not drink alcohol. Do not drink caffeine. Drink enough fluid to keep your pee (urine) pale yellow. Lifestyle Stay away from cigarette smoke. Do not smoke or use any products that contain nicotine or tobacco. If you need help quitting, ask your doctor. Stay away from things that make you cough. These may include perfume, candles, cleaning products, or campfire smoke. General instructions  Watch for any changes to your cough. Tell your doctor about them. Always cover your mouth when you cough. If the air is dry in your home, use a cool mist vaporizer or humidifier. If your cough is worse at night, try using extra pillows to raise your head up higher while you sleep. Rest as needed. Contact a doctor if: You have new symptoms. Your symptoms get worse. You cough up pus. You have a  fever that does not go away. Your cough does not get better after 2-3 weeks. Cough medicine does not help, and you are not sleeping well. You have pain that gets worse or is not helped with medicine. You are losing weight and do not know why. You have night sweats. Get help right away if: You cough up blood. You have trouble breathing. Your heart is beating very fast. These symptoms may be an emergency. Get help right away. Call 911. Do not wait to see if the symptoms will go away. Do not drive yourself to the hospital. This information is not intended to replace advice given to you by your health care provider. Make sure you discuss any questions you have with your health care provider. Document Revised: 04/07/2022 Document Reviewed: 04/07/2022 Elsevier Patient Education  2024 [employer].

## 2024-09-22 ENCOUNTER — Ambulatory Visit
Admission: RE | Admit: 2024-09-22 | Discharge: 2024-09-22 | Disposition: A | Source: Ambulatory Visit | Attending: Nurse Practitioner | Admitting: Nurse Practitioner

## 2024-09-22 DIAGNOSIS — M810 Age-related osteoporosis without current pathological fracture: Secondary | ICD-10-CM

## 2024-09-23 ENCOUNTER — Other Ambulatory Visit: Payer: Self-pay | Admitting: Nurse Practitioner

## 2024-09-23 NOTE — Progress Notes (Signed)
 Contacted via MyChart  Good morning Emily Watson and family, her bone density has returned and continues to show osteoporosis, but now worsening in this. It has similar score to past score. I would continue Fosamax  for now and next visit we can discuss alternative medications that may help more. Any questions?

## 2024-09-25 NOTE — Telephone Encounter (Signed)
 Requested by interface surescripts. Future appt 10/15/24.  Requested Prescriptions  Pending Prescriptions Disp Refills   alendronate  (FOSAMAX ) 70 MG tablet [Pharmacy Med Name: Alendronate  Sodium 70 MG Oral Tablet] 12 tablet 0    Sig: TAKE 1 TABLET BY MOUTH EVERY 7 DAYS. TAKE WITH A FULL GLASS OF WATER ON AN EMPTY STOMACH     Endocrinology:  Bisphosphonates Failed - 09/25/2024  1:40 PM      Failed - Mg Level in normal range and within 360 days    Magnesium   Date Value Ref Range Status  08/22/2022 2.2 1.6 - 2.3 mg/dL Final         Failed - Phosphate in normal range and within 360 days    No results found for: PHOS       Passed - Ca in normal range and within 360 days    Calcium   Date Value Ref Range Status  04/11/2024 9.2 8.7 - 10.3 mg/dL Final         Passed - Vitamin D  in normal range and within 360 days    Vit D, 25-Hydroxy  Date Value Ref Range Status  10/12/2023 60.2 30.0 - 100.0 ng/mL Final    Comment:    Vitamin D  deficiency has been defined by the Institute of Medicine and an Endocrine Society practice guideline as a level of serum 25-OH vitamin D  less than 20 ng/mL (1,2). The Endocrine Society went on to further define vitamin D  insufficiency as a level between 21 and 29 ng/mL (2). 1. IOM (Institute of Medicine). 2010. Dietary reference    intakes for calcium  and D. [place]: The    [employer]. 2. Holick MF, Binkley Morehouse, Bischoff-Ferrari HA, et al.    Evaluation, treatment, and prevention of vitamin D     deficiency: an Endocrine Society clinical practice    guideline. JCEM. 2011 Jul; 96(7):1911-30.          Passed - Cr in normal range and within 360 days    Creatinine, Ser  Date Value Ref Range Status  04/11/2024 0.65 0.57 - 1.00 mg/dL Final         Passed - eGFR is 30 or above and within 360 days    GFR calc Af Amer  Date Value Ref Range Status  06/09/2020 104 >59 mL/min/1.73 Final    Comment:    **In accordance with recommendations from  the NKF-ASN Task force,**   [facility] is in the process of updating its eGFR calculation to the   2021 CKD-EPI creatinine equation that estimates kidney function   without a race variable.    GFR calc non Af Amer  Date Value Ref Range Status  06/09/2020 90 >59 mL/min/1.73 Final   eGFR  Date Value Ref Range Status  04/11/2024 92 >59 mL/min/1.73 Final         Passed - Valid encounter within last 12 months    Recent Outpatient Visits           1 month ago Bronchitis   [place] [facility] [place], [place] T, NP   5 months ago Vascular dementia, mild, with mood disturbance (HCC)   [facility], Melanie T, NP   8 months ago Hypercholesteremia   [place] [facility] [place] Pao, NP   11 months ago Medicare annual wellness visit, subsequent   [facility] [place], Melanie DASEN, NP              Passed -  Bone Mineral Density or Dexa Scan completed in the last 2 years

## 2024-10-15 ENCOUNTER — Encounter: Admitting: Nurse Practitioner
